# Patient Record
Sex: Female | Born: 1960 | Race: Black or African American | Hispanic: No | Marital: Married | Smoking: Former smoker
Health system: Southern US, Community
[De-identification: ages and names within clinical notes are randomized; demographics above are authoritative.]

## PROBLEM LIST (undated history)

## (undated) DIAGNOSIS — J45909 Unspecified asthma, uncomplicated: Secondary | ICD-10-CM

## (undated) DIAGNOSIS — J449 Chronic obstructive pulmonary disease, unspecified: Secondary | ICD-10-CM

## (undated) DIAGNOSIS — L309 Dermatitis, unspecified: Secondary | ICD-10-CM

## (undated) DIAGNOSIS — E162 Hypoglycemia, unspecified: Secondary | ICD-10-CM

## (undated) DIAGNOSIS — E039 Hypothyroidism, unspecified: Secondary | ICD-10-CM

## (undated) DIAGNOSIS — Z972 Presence of dental prosthetic device (complete) (partial): Secondary | ICD-10-CM

## (undated) DIAGNOSIS — IMO0001 Reserved for inherently not codable concepts without codable children: Secondary | ICD-10-CM

## (undated) HISTORY — DX: Unspecified asthma, uncomplicated: J45.909

## (undated) HISTORY — PX: APPENDECTOMY: SHX54

## (undated) HISTORY — PX: OOPHORECTOMY: SHX86

---

## 2008-07-31 ENCOUNTER — Ambulatory Visit: Payer: Self-pay | Admitting: Ophthalmology

## 2008-08-25 ENCOUNTER — Ambulatory Visit: Payer: Self-pay | Admitting: Family Medicine

## 2008-09-04 ENCOUNTER — Ambulatory Visit: Payer: Self-pay | Admitting: Family Medicine

## 2009-04-20 ENCOUNTER — Ambulatory Visit: Payer: Self-pay | Admitting: Family Medicine

## 2009-04-20 ENCOUNTER — Observation Stay: Payer: Self-pay | Admitting: Internal Medicine

## 2009-09-24 DIAGNOSIS — E039 Hypothyroidism, unspecified: Secondary | ICD-10-CM | POA: Insufficient documentation

## 2010-12-15 ENCOUNTER — Ambulatory Visit: Payer: Self-pay | Admitting: Family Medicine

## 2011-07-18 LAB — HM PAP SMEAR: HM Pap smear: NORMAL

## 2012-01-04 ENCOUNTER — Ambulatory Visit: Payer: Self-pay | Admitting: Family Medicine

## 2012-01-04 LAB — HM MAMMOGRAPHY: HM Mammogram: NORMAL

## 2012-05-09 ENCOUNTER — Ambulatory Visit: Payer: Self-pay | Admitting: Family Medicine

## 2013-11-14 LAB — LIPID PANEL
Cholesterol: 216 mg/dL — AB (ref 0–200)
HDL: 66 mg/dL (ref 35–70)
LDL CALC: 136 mg/dL
Triglycerides: 72 mg/dL (ref 40–160)

## 2014-03-13 ENCOUNTER — Ambulatory Visit: Payer: Self-pay | Admitting: Family Medicine

## 2014-03-19 ENCOUNTER — Encounter: Payer: Self-pay | Admitting: *Deleted

## 2014-04-02 ENCOUNTER — Ambulatory Visit: Payer: Self-pay | Admitting: General Surgery

## 2014-04-09 ENCOUNTER — Encounter: Payer: Self-pay | Admitting: *Deleted

## 2014-06-04 LAB — HEMOGLOBIN A1C: Hgb A1c MFr Bld: 6.4 % — AB (ref 4.0–6.0)

## 2014-07-14 ENCOUNTER — Ambulatory Visit: Payer: Self-pay | Admitting: General Surgery

## 2014-09-02 ENCOUNTER — Encounter: Payer: Self-pay | Admitting: *Deleted

## 2014-09-02 ENCOUNTER — Telehealth: Payer: Self-pay

## 2014-09-02 NOTE — Telephone Encounter (Signed)
Called patient to notify her we received letter from [facility] notifying us of her no show

## 2014-09-04 ENCOUNTER — Ambulatory Visit: Payer: Self-pay | Admitting: Family Medicine

## 2014-11-18 ENCOUNTER — Other Ambulatory Visit: Payer: Self-pay | Admitting: Family Medicine

## 2014-11-18 NOTE — Telephone Encounter (Signed)
Patient requesting refill. 

## 2014-11-20 NOTE — Telephone Encounter (Signed)
Spoke with patient on 11-19-14 and she will call back to reschedule.

## 2015-01-07 ENCOUNTER — Ambulatory Visit (INDEPENDENT_AMBULATORY_CARE_PROVIDER_SITE_OTHER): Payer: 59 | Admitting: Family Medicine

## 2015-01-07 ENCOUNTER — Encounter: Payer: Self-pay | Admitting: Family Medicine

## 2015-01-07 ENCOUNTER — Encounter (INDEPENDENT_AMBULATORY_CARE_PROVIDER_SITE_OTHER): Payer: Self-pay

## 2015-01-07 VITALS — BP 118/68 | HR 88 | Temp 98.8°F | Resp 16 | Wt 155.2 lb

## 2015-01-07 DIAGNOSIS — Z23 Encounter for immunization: Secondary | ICD-10-CM

## 2015-01-07 DIAGNOSIS — E039 Hypothyroidism, unspecified: Secondary | ICD-10-CM

## 2015-01-07 DIAGNOSIS — J069 Acute upper respiratory infection, unspecified: Secondary | ICD-10-CM | POA: Diagnosis not present

## 2015-01-07 DIAGNOSIS — J302 Other seasonal allergic rhinitis: Secondary | ICD-10-CM

## 2015-01-07 DIAGNOSIS — L280 Lichen simplex chronicus: Secondary | ICD-10-CM | POA: Insufficient documentation

## 2015-01-07 DIAGNOSIS — K8020 Calculus of gallbladder without cholecystitis without obstruction: Secondary | ICD-10-CM

## 2015-01-07 DIAGNOSIS — N841 Polyp of cervix uteri: Secondary | ICD-10-CM | POA: Insufficient documentation

## 2015-01-07 DIAGNOSIS — Z862 Personal history of diseases of the blood and blood-forming organs and certain disorders involving the immune mechanism: Secondary | ICD-10-CM | POA: Diagnosis not present

## 2015-01-07 DIAGNOSIS — R7303 Prediabetes: Secondary | ICD-10-CM | POA: Diagnosis not present

## 2015-01-07 DIAGNOSIS — E559 Vitamin D deficiency, unspecified: Secondary | ICD-10-CM

## 2015-01-07 DIAGNOSIS — Z13220 Encounter for screening for lipoid disorders: Secondary | ICD-10-CM | POA: Diagnosis not present

## 2015-01-07 DIAGNOSIS — J4540 Moderate persistent asthma, uncomplicated: Secondary | ICD-10-CM

## 2015-01-07 DIAGNOSIS — R7989 Other specified abnormal findings of blood chemistry: Secondary | ICD-10-CM

## 2015-01-07 DIAGNOSIS — E663 Overweight: Secondary | ICD-10-CM

## 2015-01-07 DIAGNOSIS — R945 Abnormal results of liver function studies: Secondary | ICD-10-CM

## 2015-01-07 DIAGNOSIS — D259 Leiomyoma of uterus, unspecified: Secondary | ICD-10-CM | POA: Insufficient documentation

## 2015-01-07 MED ORDER — FLUTICASONE PROPIONATE 50 MCG/ACT NA SUSP
2.0000 | Freq: Every day | NASAL | Status: DC
Start: 2015-01-07 — End: 2015-05-11

## 2015-01-07 MED ORDER — LEVOTHYROXINE SODIUM 125 MCG PO TABS: 125.0000 ug | ORAL_TABLET | Freq: Every day | ORAL | Status: DC

## 2015-01-07 MED ORDER — ALBUTEROL SULFATE HFA 108 (90 BASE) MCG/ACT IN AERS: 2.0000 | INHALATION_SPRAY | Freq: Four times a day (QID) | RESPIRATORY_TRACT | Status: DC | PRN

## 2015-01-07 MED ORDER — FLUTICASONE FUROATE-VILANTEROL 100-25 MCG/INH IN AEPB: 1.0000 | INHALATION_SPRAY | Freq: Every day | RESPIRATORY_TRACT | Status: DC

## 2015-01-07 MED ORDER — LORATADINE 10 MG PO TABS: 10.0000 mg | ORAL_TABLET | Freq: Every day | ORAL | Status: DC

## 2015-01-07 NOTE — Progress Notes (Addendum)
Name: Maria Richardson   MRN: [id number]    DOB: 11/02/1960   Date:01/07/2015       Progress Note  Subjective  Chief Complaint  Chief Complaint  Patient presents with  . Cough    Pt has had cold sypmtoms since last Wed which are not getting better with OTC medications  . Nasal Congestion    HPI  URI: she states symptoms started with dry cough followed by nasal congestion and yesterday she developed SOB. She had to use the second dose of Advair last night to improve symptoms. She has not tried any OTC medications. No chills, fever, ear pain, no sore throat. Grandchildren always have runny noses  Asthma Moderate: she usually uses Advair only once daily, symptoms are usually controlled, but she has noticed SOB, wheezing and cough with recent URI. She also has noticed difficulty walking from her car to her job, she has to walk very slowly to be able to breat    01/07/15 1018  Asthma History  Symptoms >2 days/week  Nighttime Awakenings 0-2/month  Asthma interference with normal activity Minor limitations  SABA use (not for EIB) 0-2 days/wk  Risk: Exacerbations requiring oral systemic steroids 0-1 / year  Asthma Severity Moderate Persistent   Hypothyroidism: she has been out of medication for the past week. Denies dry skin, constipation or hair loss.   AR: she has not been using any medications at this time  Patient Active Problem List   Diagnosis Date Noted  . Abnormal LFTs 01/07/2015  . Seasonal allergic rhinitis 01/07/2015  . Dermatitis, eczematoid 01/07/2015  . History of anemia 01/07/2015  . Excess weight 01/07/2015  . 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 Leiomyoma of uterus 01/07/2015  . Calculus of gallbladder 01/07/2015  . Acquired hypothyroidism 09/24/2009  . Asthma, moderate persistent, poorly-controlled 05/18/2009  . Vitamin D deficiency 05/18/2009    History reviewed. No pertinent past surgical history.  Family History  Problem Relation Age of Onset  . Diabetes  Father   . Hypertension Father   . Kidney disease Father     Social History   Social History  . Marital Status: Married    Spouse Name: N/A  . Number of Children: N/A  . Years of Education: N/A   Occupational History  . Not on file.   Social History Main Topics  . Smoking status: Former Research scientist (life sciences)  . Smokeless tobacco: Not on file     Comment: quit 17 years ago  . Alcohol Use: No  . Drug Use: No  . Sexual Activity: Not Currently     Comment: no desire   Other Topics Concern  . Not on file   Social History Narrative  . No narrative on file     Current outpatient prescriptions:  .  levothyroxine (LEVOXYL) 125 MCG tablet, Take by mouth., Disp: , Rfl:  .  albuterol (PROAIR HFA) 108 (90 BASE) MCG/ACT inhaler, Inhale 2 puffs into the lungs every 6 (six) hours as needed for wheezing or shortness of breath., Disp: 1 Inhaler, Rfl: 0 .  fluticasone (FLONASE) 50 MCG/ACT nasal spray, Place 2 sprays into both nostrils daily., Disp: 16 g, Rfl: 2 .  Fluticasone Furoate-Vilanterol (BREO ELLIPTA) 100-25 MCG/INH AEPB, Inhale 1 puff into the lungs daily., Disp: 1 each, Rfl: 5  Allergies  Allergen Reactions  . Codeine Nausea And Vomiting     ROS  Constitutional: Negative for fever or weight change.  Respiratory: Positive for cough and shortness of breath.   Cardiovascular: Negative  for chest pain or palpitations.  Gastrointestinal: Negative for abdominal pain, no bowel changes.  Musculoskeletal: Negative for gait problem or joint swelling.  Skin: Negative for rash.  Neurological: Negative for dizziness or headache.  No other specific complaints in a complete review of systems (except as listed in HPI above).  Objective  Filed Vitals:   01/07/15 0958  BP: 118/68  Pulse: 88  Temp: 98.8 F (37.1 C)  Resp: 16  Weight: 155 lb 3 oz (70.393 kg)  SpO2: 92%    Body mass index is 29.34 kg/(m^2).  Physical Exam  Constitutional: Patient appears well-developed and well-nourished.  Obese  No distress.  HEENT: head atraumatic, normocephalic, pupils equal and reactive to light, ear TM normal  neck supple, throat within normal limits, boggy turbinates, worse on left side, non tenderness during percussion of sinus Cardiovascular: Normal rate, regular rhythm and normal heart sounds.  No murmur heard. No BLE edema. Pulmonary/Chest: Effort normal and scattered end inspiratory crackles heard on basesl. No respiratory distress. Abdominal: Soft.  There is no tenderness. Psychiatric: Patient has a normal mood and affect. behavior is normal. Judgment and thought content normal.    Assessment & Plan  1. Asthma, moderate persistent, poorly-controlled   change to Breo to increase compliance, having a flare secondary to URI - albuterol (PROAIR HFA) 108 (90 BASE) MCG/ACT inhaler; Inhale 2 puffs into the lungs every 6 (six) hours as needed for wheezing or shortness of breath.  Dispense: 1 Inhaler; Refill: 0 - Fluticasone Furoate-Vilanterol (BREO ELLIPTA) 100-25 MCG/INH AEPB; Inhale 1 puff into the lungs daily.  Dispense: 1 each; Refill: 5  2. Calculus of gallbladder   Incidental finding when we were looking for elevated LFT's she was referred to surgeon twice, but she will call back when she has time off to have surgery next year. She denies biliary cholic   3. Acquired hypothyroidism   Recheck labs, she has been off medication for the past week, so we will send same dose of Levothyroxine - TSH  4. Excess weight  Discussed with the patient the risk posed by an increased BMI. Discussed importance of portion control, calorie counting and at least 150 minutes of physical activity weekly. Avoid sweet beverages and drink more water. Eat at least 6 servings of fruit and vegetables daily   5. Upper respiratory infection  Advised nasal saline, otc medication  6. Seasonal allergic rhinitis  - fluticasone (FLONASE) 50 MCG/ACT nasal spray; Place 2 sprays into both nostrils daily.   Dispense: 16 g; Refill: 2 -Loratadine 10 mg one daily  7. Borderline diabetes  - Hemoglobin A1c - Comprehensive metabolic panel  8. Vitamin D deficiency  - Vit D  25 hydroxy (rtn osteoporosis monitoring)  9. History of anemia  - CBC with Differential/Platelet  10. Abnormal LFTs  - Comprehensive metabolic panel  11. Lipid screening  - Lipid panel  12. Needs flu shot  - Flu Vaccine QUAD 36+ mos IM

## 2015-01-07 NOTE — Patient Instructions (Signed)
Coricidin HBP.

## 2015-01-08 LAB — CBC WITH DIFFERENTIAL/PLATELET
BASOS: 1 %
Basophils Absolute: 0.1 10*3/uL (ref 0.0–0.2)
EOS (ABSOLUTE): 0.7 10*3/uL — ABNORMAL HIGH (ref 0.0–0.4)
EOS: 9 %
HEMATOCRIT: 41.3 % (ref 34.0–46.6)
Hemoglobin: 13.5 g/dL (ref 11.1–15.9)
IMMATURE GRANS (ABS): 0 10*3/uL (ref 0.0–0.1)
IMMATURE GRANULOCYTES: 0 %
Lymphocytes Absolute: 1.5 10*3/uL (ref 0.7–3.1)
Lymphs: 18 %
MCH: 29.3 pg (ref 26.6–33.0)
MCHC: 32.7 g/dL (ref 31.5–35.7)
MCV: 90 fL (ref 79–97)
MONOS ABS: 0.4 10*3/uL (ref 0.1–0.9)
Monocytes: 5 %
NEUTROS ABS: 5.4 10*3/uL (ref 1.4–7.0)
NEUTROS PCT: 67 %
Platelets: 286 10*3/uL (ref 150–379)
RBC: 4.6 x10E6/uL (ref 3.77–5.28)
RDW: 15.5 % — AB (ref 12.3–15.4)
WBC: 8.1 10*3/uL (ref 3.4–10.8)

## 2015-01-08 LAB — COMPREHENSIVE METABOLIC PANEL
ALBUMIN: 4.3 g/dL (ref 3.5–5.5)
ALT: 17 IU/L (ref 0–32)
AST: 26 IU/L (ref 0–40)
Albumin/Globulin Ratio: 1.3 (ref 1.1–2.5)
Alkaline Phosphatase: 85 IU/L (ref 39–117)
BUN/Creatinine Ratio: 11 (ref 9–23)
BUN: 11 mg/dL (ref 6–24)
Bilirubin Total: 0.5 mg/dL (ref 0.0–1.2)
CALCIUM: 9.5 mg/dL (ref 8.7–10.2)
CO2: 25 mmol/L (ref 18–29)
CREATININE: 0.97 mg/dL (ref 0.57–1.00)
Chloride: 102 mmol/L (ref 97–108)
GFR, EST AFRICAN AMERICAN: 77 mL/min/{1.73_m2} (ref >59)
GFR, EST NON AFRICAN AMERICAN: 67 mL/min/{1.73_m2} (ref >59)
GLOBULIN, TOTAL: 3.4 g/dL (ref 1.5–4.5)
Glucose: 92 mg/dL (ref 65–99)
Potassium: 4.5 mmol/L (ref 3.5–5.2)
SODIUM: 143 mmol/L (ref 134–144)
TOTAL PROTEIN: 7.7 g/dL (ref 6.0–8.5)

## 2015-01-08 LAB — LIPID PANEL
CHOL/HDL RATIO: 3.5 ratio (ref 0.0–4.4)
Cholesterol, Total: 209 mg/dL — ABNORMAL HIGH (ref 100–199)
HDL: 59 mg/dL (ref >39)
LDL Calculated: 135 mg/dL — ABNORMAL HIGH (ref 0–99)
Triglycerides: 73 mg/dL (ref 0–149)
VLDL CHOLESTEROL CAL: 15 mg/dL (ref 5–40)

## 2015-01-08 LAB — TSH: TSH: 45.56 u[IU]/mL — ABNORMAL HIGH (ref 0.450–4.500)

## 2015-01-08 LAB — HEMOGLOBIN A1C
Est. average glucose Bld gHb Est-mCnc: 131 mg/dL
Hgb A1c MFr Bld: 6.2 % — ABNORMAL HIGH (ref 4.8–5.6)

## 2015-01-08 LAB — VITAMIN D 25 HYDROXY (VIT D DEFICIENCY, FRACTURES): Vit D, 25-Hydroxy: 18.4 ng/mL — ABNORMAL LOW (ref 30.0–100.0)

## 2015-01-10 ENCOUNTER — Other Ambulatory Visit: Payer: Self-pay | Admitting: Family Medicine

## 2015-01-10 DIAGNOSIS — E039 Hypothyroidism, unspecified: Secondary | ICD-10-CM

## 2015-01-11 NOTE — Progress Notes (Signed)
Patient notified and mailed copy of lab slip to be rechecked in 6 weeks.

## 2015-03-22 ENCOUNTER — Other Ambulatory Visit: Payer: Self-pay | Admitting: Family Medicine

## 2015-03-23 LAB — TSH: TSH: 33.55 u[IU]/mL — ABNORMAL HIGH (ref 0.450–4.500)

## 2015-03-31 ENCOUNTER — Telehealth: Payer: Self-pay

## 2015-03-31 DIAGNOSIS — E039 Hypothyroidism, unspecified: Secondary | ICD-10-CM

## 2015-03-31 MED ORDER — LEVOTHYROXINE SODIUM 150 MCG PO TABS: 150.0000 ug | ORAL_TABLET | Freq: Every day | ORAL | Status: DC

## 2015-03-31 NOTE — Telephone Encounter (Signed)
Pt called states she ran out of thyroid med about 3 days before getting blood work and has not took none since.  Before when she was taking it was 1 pill a daily, but states when she remembered to take.

## 2015-03-31 NOTE — Telephone Encounter (Signed)
I sent a higher dose, needs to take it daily and recheck level in 6 weeks. Thank you. I ordered labs also

## 2015-04-07 ENCOUNTER — Telehealth: Payer: Self-pay | Admitting: Family Medicine

## 2015-04-07 NOTE — Telephone Encounter (Signed)
Tell her to try taking half twice daily

## 2015-04-07 NOTE — Telephone Encounter (Signed)
Thyroxin was changed to 150 but it is not agreeing with her. She is suppose to take it with breakfast. The medication curves her appitate, she is not able to sleep. Please advise

## 2015-04-08 ENCOUNTER — Telehealth: Payer: Self-pay

## 2015-04-08 NOTE — Telephone Encounter (Signed)
Patient: Maria Richardson was changed to 150 but it is not agreeing with her. She is suppose to take it with breakfast. The medication curves her appitate, she is not able to sleep. Please advise  Dr.Sowles: Tell her to try taking half twice daily   Patient was informed of Dr. Ancil Boozer message and stated that she will try it, although she did not have this problem yesterday when she did not take her medication. She was encouraged to call us back if her sx does not improve or if they worsen and she said ok.

## 2015-04-22 ENCOUNTER — Other Ambulatory Visit: Payer: Self-pay

## 2015-04-22 ENCOUNTER — Telehealth: Payer: Self-pay | Admitting: Family Medicine

## 2015-04-22 MED ORDER — FLUTICASONE-SALMETEROL 250-50 MCG/DOSE IN AEPB: 1.0000 | INHALATION_SPRAY | Freq: Two times a day (BID) | RESPIRATORY_TRACT | Status: DC

## 2015-04-22 NOTE — Telephone Encounter (Signed)
She is not on Advair, she is on Breo now. Does she want a 90 day of Breo, or change back to Advair

## 2015-04-22 NOTE — Telephone Encounter (Signed)
Patient states the [place] works better for her, and she quit taking the [place]. Could you please send in Advair to her Mail order. Thanks

## 2015-04-22 NOTE — Telephone Encounter (Signed)
Pt needs refill on Advair 90 day supply to be sent to [facility] in [place].

## 2015-04-22 NOTE — Telephone Encounter (Signed)
done

## 2015-04-22 NOTE — Telephone Encounter (Signed)
Patient states Advair works better for patient than Breo, and would like her supply please send to her Mail order.

## 2015-05-11 ENCOUNTER — Ambulatory Visit (INDEPENDENT_AMBULATORY_CARE_PROVIDER_SITE_OTHER): Payer: 59 | Admitting: Family Medicine

## 2015-05-11 ENCOUNTER — Encounter: Payer: Self-pay | Admitting: Family Medicine

## 2015-05-11 VITALS — BP 130/62 | HR 90 | Temp 98.5°F | Resp 14 | Wt 152.8 lb

## 2015-05-11 DIAGNOSIS — E039 Hypothyroidism, unspecified: Secondary | ICD-10-CM | POA: Diagnosis not present

## 2015-05-11 DIAGNOSIS — K921 Melena: Secondary | ICD-10-CM | POA: Diagnosis not present

## 2015-05-11 DIAGNOSIS — R7303 Prediabetes: Secondary | ICD-10-CM | POA: Diagnosis not present

## 2015-05-11 DIAGNOSIS — J069 Acute upper respiratory infection, unspecified: Secondary | ICD-10-CM

## 2015-05-11 DIAGNOSIS — J302 Other seasonal allergic rhinitis: Secondary | ICD-10-CM

## 2015-05-11 MED ORDER — FLUTICASONE PROPIONATE 50 MCG/ACT NA SUSP: 2.0000 | Freq: Every day | NASAL | Status: DC

## 2015-05-11 MED ORDER — BENZONATATE 100 MG PO CAPS: 100.0000 mg | ORAL_CAPSULE | Freq: Three times a day (TID) | ORAL | Status: DC | PRN

## 2015-05-11 NOTE — Progress Notes (Signed)
Name: Maria Richardson   MRN: [id number]    DOB: 03/16/1961   Date:05/11/2015       Progress Note  Subjective  Chief Complaint  Chief Complaint  Patient presents with  . Blood In Stools  . URI    HPI  Hematochezia: she has noticed some bright blood when she wipes but also mixed in her stools over the past 2 weeks with every bowel movement. She denies constipation or straining. No change in appetite, very mild weight loss, only a few lbs since last visit. She denies abdominal pain, or indigestion. Father has a history of colon cancer.   Borderline DM: last hgb A1C was slightly better, down from 6.4% to 6.2% - she is following a diabetic diet. She denies polyphagia, polydipsia or polyuria.  Hypothyroidism: last TSH still above goal , she is taking medication but she states she has been taking after meals, explained she needs to take fasting to improve absorption.   URI: father has URI, and over the past 2 days she has developed nasal congestion, rhinorrhea, and a wet cough. No fever, chills or headaches. Normal appetite. She is taking otc Tylenol flu medication - but only takes at night. She is not sure if it is helping with symptoms.   Asthma Moderate: even with her cold, she denies wheezing or SOB, only the cough.    Patient Active Problem List   Diagnosis Date Noted  . Seasonal allergic rhinitis 01/07/2015  . Dermatitis, eczematoid 01/07/2015  . History of anemia 01/07/2015  . Excess weight 01/07/2015  . 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 Leiomyoma of uterus 01/07/2015  . Calculus of gallbladder 01/07/2015  . Acquired hypothyroidism 09/24/2009  . Asthma, moderate persistent, poorly-controlled 05/18/2009  . Vitamin D deficiency 05/18/2009    History reviewed. No pertinent past surgical history.  Family History  Problem Relation Age of Onset  . Diabetes Father   . Hypertension Father   . Kidney disease Father     Social History   Social History  . Marital Status: Married     Spouse Name: N/A  . Number of Children: N/A  . Years of Education: N/A   Occupational History  . Not on file.   Social History Main Topics  . Smoking status: Former Research scientist (life sciences)  . Smokeless tobacco: Not on file     Comment: quit 17 years ago  . Alcohol Use: No  . Drug Use: No  . Sexual Activity: Not Currently     Comment: no desire   Other Topics Concern  . Not on file   Social History Narrative     Current outpatient prescriptions:  .  Fluticasone-Salmeterol (ADVAIR DISKUS) 250-50 MCG/DOSE AEPB, Inhale 1 puff into the lungs 2 (two) times daily., Disp: 3 each, Rfl: 2 .  levothyroxine (SYNTHROID, LEVOTHROID) 150 MCG tablet, Take 1 tablet (150 mcg total) by mouth daily before breakfast., Disp: 30 tablet, Rfl: 1 .  albuterol (PROAIR HFA) 108 (90 BASE) MCG/ACT inhaler, Inhale 2 puffs into the lungs every 6 (six) hours as needed for wheezing or shortness of breath. (Patient not taking: Reported on 05/11/2015), Disp: 1 Inhaler, Rfl: 0 .  fluticasone (FLONASE) 50 MCG/ACT nasal spray, Place 2 sprays into both nostrils daily., Disp: 16 g, Rfl: 2  Allergies  Allergen Reactions  . Codeine Nausea And Vomiting     ROS  Ten systems reviewed and is negative except as mentioned in HPI   Objective  Filed Vitals:   05/11/15 1435  BP: 130/62  Pulse: 90  Temp: 98.5 F (36.9 C)  TempSrc: Oral  Resp: 14  Weight: 152 lb 12.8 oz (69.31 kg)  SpO2: 94%    Body mass index is 28.89 kg/(m^2).  Physical Exam  Constitutional: Patient appears well-developed and well-nourished. Obese  No distress.  HEENT: head atraumatic, normocephalic, pupils equal and reactive to light, ears TM normal bilateral, neck supple, throat within normal limits Cardiovascular: Normal rate, regular rhythm and normal heart sounds.  No murmur heard. No BLE edema. Pulmonary/Chest: Effort normal and breath sounds normal. No respiratory distress. Abdominal: Soft.  There is no tenderness. Psychiatric: Patient has a  normal mood and affect. behavior is normal. Judgment and thought content normal.  Recent Results (from the past 2160 hour(s))  TSH     Status: Abnormal   Collection Time: 03/22/15  8:14 AM  Result Value Ref Range   TSH 33.550 (H) 0.450 - 4.500 uIU/mL     Assessment & Plan  1. Acquired hypothyroidism  - TSH  2. Upper respiratory infection  - benzonatate (TESSALON) 100 MG capsule; Take 1-2 capsules (100-200 mg total) by mouth 3 (three) times daily as needed for cough.  Dispense: 40 capsule; Refill: 0  3. Hematochezia  She refused colonoscopy in the past, but is willing to go now. Exam not done, needs to be evaluated, explained that even if she has hemorrhoids she will need a colonoscopy  - Ambulatory referral to General Surgery - CBC with Differential/Platelet - Ferritin  4. Borderline diabetes  Continue diabetic diet  5. Seasonal allergic rhinitis  - fluticasone (FLONASE) 50 MCG/ACT nasal spray; Place 2 sprays into both nostrils daily.  Dispense: 16 g; Refill: 2

## 2015-05-12 ENCOUNTER — Telehealth: Payer: Self-pay

## 2015-05-12 LAB — CBC WITH DIFFERENTIAL/PLATELET
BASOS ABS: 0.1 10*3/uL (ref 0.0–0.2)
Basos: 1 %
EOS (ABSOLUTE): 0.6 10*3/uL — AB (ref 0.0–0.4)
Eos: 11 %
HEMOGLOBIN: 12.9 g/dL (ref 11.1–15.9)
Hematocrit: 39.5 % (ref 34.0–46.6)
Immature Grans (Abs): 0 10*3/uL (ref 0.0–0.1)
Immature Granulocytes: 0 %
LYMPHS ABS: 1.6 10*3/uL (ref 0.7–3.1)
Lymphs: 27 %
MCH: 29.8 pg (ref 26.6–33.0)
MCHC: 32.7 g/dL (ref 31.5–35.7)
MCV: 91 fL (ref 79–97)
Monocytes Absolute: 0.6 10*3/uL (ref 0.1–0.9)
Monocytes: 9 %
NEUTROS ABS: 3.1 10*3/uL (ref 1.4–7.0)
Neutrophils: 52 %
PLATELETS: 270 10*3/uL (ref 150–379)
RBC: 4.33 x10E6/uL (ref 3.77–5.28)
RDW: 14.6 % (ref 12.3–15.4)
WBC: 6 10*3/uL (ref 3.4–10.8)

## 2015-05-12 LAB — FERRITIN: FERRITIN: 304 ng/mL — AB (ref 15–150)

## 2015-05-12 LAB — TSH: TSH: 10.93 u[IU]/mL — ABNORMAL HIGH (ref 0.450–4.500)

## 2015-05-12 NOTE — Telephone Encounter (Signed)
rx was sent into patient's mail order yesterday instead of her local pharmacy.  rx for Tessalon & Flonase was verbally called into [facility] and cancelled via [employer] (spoke to [place]).

## 2015-05-14 ENCOUNTER — Other Ambulatory Visit: Payer: Self-pay | Admitting: Family Medicine

## 2015-05-14 DIAGNOSIS — E039 Hypothyroidism, unspecified: Secondary | ICD-10-CM

## 2015-05-14 MED ORDER — LEVOTHYROXINE SODIUM 150 MCG PO TABS: 150.0000 ug | ORAL_TABLET | Freq: Every day | ORAL | Status: DC

## 2015-05-16 ENCOUNTER — Telehealth: Payer: Self-pay | Admitting: Gastroenterology

## 2015-05-16 NOTE — Telephone Encounter (Signed)
Gastroenterology Pre-Procedure Review  Request Date 06-04-2015 Requesting Physician: Dr.   PATIENT REVIEW QUESTIONS: The patient responded to the following health history questions as indicated:    1. Are you having any GI issues? no 2. Do you have a personal history of Polyps? no 3. Do you have a family history of Colon Cancer or Polyps? yes (Father colon cancer) 4. Diabetes Mellitus? no 5. Joint replacements in the past 12 months?no 6. Major health problems in the past 3 months?no 7. Any artificial heart valves, MVP, or defibrillator?no    MEDICATIONS & ALLERGIES:    Patient reports the following regarding taking any anticoagulation/antiplatelet therapy:   Plavix, Coumadin, Eliquis, Xarelto, Lovenox, Pradaxa, Brilinta, or Effient? no Aspirin? no  Patient confirms/reports the following medications:  Current Outpatient Prescriptions  Medication Sig Dispense Refill   albuterol (PROAIR HFA) 108 (90 BASE) MCG/ACT inhaler Inhale 2 puffs into the lungs every 6 (six) hours as needed for wheezing or shortness of breath. (Patient not taking: Reported on 05/11/2015) 1 Inhaler 0   benzonatate (TESSALON) 100 MG capsule Take 1-2 capsules (100-200 mg total) by mouth 3 (three) times daily as needed for cough. 40 capsule 0   fluticasone (FLONASE) 50 MCG/ACT nasal spray Place 2 sprays into both nostrils daily. 16 g 2   Fluticasone-Salmeterol (ADVAIR DISKUS) 250-50 MCG/DOSE AEPB Inhale 1 puff into the lungs 2 (two) times daily. 3 each 2   levothyroxine (SYNTHROID, LEVOTHROID) 150 MCG tablet Take 1 tablet (150 mcg total) by mouth daily before breakfast. And one and a half on Sundays 35 tablet 1   No current facility-administered medications for this visit.    Patient confirms/reports the following allergies:  Allergies  Allergen Reactions   Codeine Nausea And Vomiting    No orders of the defined types were placed in this encounter.    AUTHORIZATION INFORMATION Primary  Insurance: 1D#: Group #:  Secondary Insurance: 1D#: Group #:  SCHEDULE INFORMATION: Date: 06-04-2015 Time: Location:[facility]

## 2015-05-17 ENCOUNTER — Other Ambulatory Visit: Payer: Self-pay

## 2015-05-28 ENCOUNTER — Encounter: Payer: Self-pay | Admitting: *Deleted

## 2015-06-01 NOTE — Discharge Instructions (Signed)

## 2015-06-14 ENCOUNTER — Ambulatory Visit: Payer: 59 | Admitting: Family Medicine

## 2015-07-14 ENCOUNTER — Other Ambulatory Visit: Payer: Self-pay | Admitting: Family Medicine

## 2015-07-14 DIAGNOSIS — E039 Hypothyroidism, unspecified: Secondary | ICD-10-CM

## 2015-07-14 DIAGNOSIS — E038 Other specified hypothyroidism: Secondary | ICD-10-CM

## 2015-07-14 MED ORDER — LEVOTHYROXINE SODIUM 150 MCG PO TABS: 150.0000 ug | ORAL_TABLET | Freq: Every day | ORAL | Status: DC

## 2015-07-14 NOTE — Telephone Encounter (Signed)
Pt needs refill on thyroid meds to be called into [facility]. Pt is out and has an appt on 07/20/2015.

## 2015-07-14 NOTE — Progress Notes (Signed)
Sending 30 days of levothyroxine, but she is due for labs now , I may need to adjust dose

## 2015-07-15 ENCOUNTER — Encounter: Payer: Self-pay | Admitting: *Deleted

## 2015-07-15 ENCOUNTER — Other Ambulatory Visit: Payer: Self-pay | Admitting: Family Medicine

## 2015-07-15 DIAGNOSIS — Z1231 Encounter for screening mammogram for malignant neoplasm of breast: Secondary | ICD-10-CM

## 2015-07-20 ENCOUNTER — Ambulatory Visit: Payer: 59 | Admitting: Family Medicine

## 2015-07-20 ENCOUNTER — Ambulatory Visit
Admission: RE | Admit: 2015-07-20 | Discharge: 2015-07-20 | Disposition: A | Discharge status: Home / Self Care | Payer: 59 | Source: Ambulatory Visit | Attending: Family Medicine | Admitting: Family Medicine

## 2015-07-20 DIAGNOSIS — Z1231 Encounter for screening mammogram for malignant neoplasm of breast: Secondary | ICD-10-CM | POA: Diagnosis not present

## 2015-07-23 ENCOUNTER — Ambulatory Visit
Admission: RE | Admit: 2015-07-23 | Discharge: 2015-07-23 | Disposition: A | Discharge status: Home / Self Care | Payer: 59 | Source: Ambulatory Visit | Attending: Gastroenterology | Admitting: Gastroenterology

## 2015-07-23 ENCOUNTER — Ambulatory Visit: Payer: 59 | Admitting: Anesthesiology

## 2015-07-23 ENCOUNTER — Encounter
Admission: RE | Disposition: A | Discharge status: Home / Self Care | Payer: Self-pay | Source: Ambulatory Visit | Attending: Gastroenterology

## 2015-07-23 DIAGNOSIS — Z800 Family history of malignant neoplasm of digestive organs: Secondary | ICD-10-CM | POA: Diagnosis not present

## 2015-07-23 DIAGNOSIS — Z841 Family history of disorders of kidney and ureter: Secondary | ICD-10-CM | POA: Insufficient documentation

## 2015-07-23 DIAGNOSIS — Z8249 Family history of ischemic heart disease and other diseases of the circulatory system: Secondary | ICD-10-CM | POA: Insufficient documentation

## 2015-07-23 DIAGNOSIS — Z90721 Acquired absence of ovaries, unilateral: Secondary | ICD-10-CM | POA: Diagnosis not present

## 2015-07-23 DIAGNOSIS — E162 Hypoglycemia, unspecified: Secondary | ICD-10-CM | POA: Insufficient documentation

## 2015-07-23 DIAGNOSIS — Z79899 Other long term (current) drug therapy: Secondary | ICD-10-CM | POA: Insufficient documentation

## 2015-07-23 DIAGNOSIS — Z87891 Personal history of nicotine dependence: Secondary | ICD-10-CM | POA: Diagnosis not present

## 2015-07-23 DIAGNOSIS — Z7951 Long term (current) use of inhaled steroids: Secondary | ICD-10-CM | POA: Diagnosis not present

## 2015-07-23 DIAGNOSIS — L309 Dermatitis, unspecified: Secondary | ICD-10-CM | POA: Insufficient documentation

## 2015-07-23 DIAGNOSIS — Z833 Family history of diabetes mellitus: Secondary | ICD-10-CM | POA: Insufficient documentation

## 2015-07-23 DIAGNOSIS — J45909 Unspecified asthma, uncomplicated: Secondary | ICD-10-CM | POA: Insufficient documentation

## 2015-07-23 DIAGNOSIS — D125 Benign neoplasm of sigmoid colon: Secondary | ICD-10-CM | POA: Insufficient documentation

## 2015-07-23 DIAGNOSIS — E039 Hypothyroidism, unspecified: Secondary | ICD-10-CM | POA: Diagnosis not present

## 2015-07-23 DIAGNOSIS — Z885 Allergy status to narcotic agent status: Secondary | ICD-10-CM | POA: Insufficient documentation

## 2015-07-23 DIAGNOSIS — Z1211 Encounter for screening for malignant neoplasm of colon: Secondary | ICD-10-CM | POA: Diagnosis not present

## 2015-07-23 HISTORY — PX: POLYPECTOMY: SHX5525

## 2015-07-23 HISTORY — DX: Reserved for inherently not codable concepts without codable children: IMO0001

## 2015-07-23 HISTORY — DX: Hypothyroidism, unspecified: E03.9

## 2015-07-23 HISTORY — DX: Hypoglycemia, unspecified: E16.2

## 2015-07-23 HISTORY — DX: Dermatitis, unspecified: L30.9

## 2015-07-23 HISTORY — DX: Presence of dental prosthetic device (complete) (partial): Z97.2

## 2015-07-23 HISTORY — PX: COLONOSCOPY WITH PROPOFOL: SHX5780

## 2015-07-23 SURGERY — COLONOSCOPY WITH PROPOFOL
Anesthesia: Monitor Anesthesia Care | Wound class: Contaminated

## 2015-07-23 MED ORDER — ACETAMINOPHEN 160 MG/5ML PO SOLN: 325.0000 mg | ORAL | Status: DC | PRN

## 2015-07-23 MED ORDER — PROPOFOL 10 MG/ML IV BOLUS
INTRAVENOUS | Status: DC | PRN
  Administered 2015-07-23: 100 mg via INTRAVENOUS
  Administered 2015-07-23: 10 mg via INTRAVENOUS
  Administered 2015-07-23 (×2): 50 mg via INTRAVENOUS
  Administered 2015-07-23: 10 mg via INTRAVENOUS

## 2015-07-23 MED ORDER — ACETAMINOPHEN 325 MG PO TABS: 325.0000 mg | ORAL_TABLET | ORAL | Status: DC | PRN

## 2015-07-23 MED ORDER — LACTATED RINGERS IV SOLN
INTRAVENOUS | Status: DC
  Administered 2015-07-23 (×2): via INTRAVENOUS

## 2015-07-23 MED ORDER — STERILE WATER FOR IRRIGATION IR SOLN
Status: DC | PRN
  Administered 2015-07-23: 11:00:00

## 2015-07-23 MED ORDER — LIDOCAINE HCL (CARDIAC) 20 MG/ML IV SOLN
INTRAVENOUS | Status: DC | PRN
  Administered 2015-07-23: 20 mg via INTRAVENOUS

## 2015-07-23 SURGICAL SUPPLY — 22 items

## 2015-07-23 NOTE — Anesthesia Preprocedure Evaluation (Signed)
Anesthesia Evaluation  Patient identified by MRN, date of birth, ID band  Reviewed: Allergy & Precautions, H&P , NPO status , Patient's Chart, lab work & pertinent test results  Airway Mallampati: II  TM Distance: >3 FB Neck ROM: full    Dental no notable dental hx. (+) Lower Dentures   Pulmonary asthma , former smoker,    Pulmonary exam normal        Cardiovascular  Rhythm:regular Rate:Normal     Neuro/Psych    GI/Hepatic   Endo/Other    Renal/GU      Musculoskeletal   Abdominal   Peds  Hematology   Anesthesia Other Findings   Reproductive/Obstetrics                             Anesthesia Physical Anesthesia Plan  ASA: II  Anesthesia Plan: MAC   Post-op Pain Management:    Induction:   Airway Management Planned:   Additional Equipment:   Intra-op Plan:   Post-operative Plan:   Informed Consent: I have reviewed the patients History and Physical, chart, labs and discussed the procedure including the risks, benefits and alternatives for the proposed anesthesia with the patient or authorized representative who has indicated his/her understanding and acceptance.     Plan Discussed with: CRNA  Anesthesia Plan Comments:         Anesthesia Quick Evaluation

## 2015-07-23 NOTE — Op Note (Signed)
[facility] Gastroenterology Patient Name: Maria Richardson Procedure Date: 07/23/2015 11:10 AM MRN: [id number] Account #: [id number] Date of Birth: Sep 21, 1960 Admit Type: Outpatient Age: 55 Room: [facility] OR ROOM 01 Gender: Female Note Status: Finalized Procedure:            Colonoscopy Indications:          Screening for colorectal malignant neoplasm Providers:            Lucilla Lame, MD Referring MD:         Bethena Roys. Sowles, MD (Referring MD) Medicines:            Propofol per Anesthesia Complications:        No immediate complications. Procedure:            Pre-Anesthesia Assessment:                       - Prior to the procedure, a History and Physical was                        performed, and patient medications and allergies were                        reviewed. The patient's tolerance of previous                        anesthesia was also reviewed. The risks and benefits of                        the procedure and the sedation options and risks were                        discussed with the patient. All questions were                        answered, and informed consent was obtained. Prior                        Anticoagulants: The patient has taken no previous                        anticoagulant or antiplatelet agents. ASA Grade                        Assessment: II - A patient with mild systemic disease.                        After reviewing the risks and benefits, the patient was                        deemed in satisfactory condition to undergo the                        procedure.                       After obtaining informed consent, the colonoscope was                        passed under direct vision. Throughout the procedure,  the patient's blood pressure, pulse, and oxygen                        saturations were monitored continuously. The Olympus CF                        H180AL colonoscope (S#: [id number]) was introduced  through                        the anus and advanced to the the cecum, identified by                        appendiceal orifice and ileocecal valve. The                        colonoscopy was performed without difficulty. The                        patient tolerated the procedure well. The quality of                        the bowel preparation was excellent. Findings:      The perianal and digital rectal examinations were normal.      A 4 mm polyp was found in the sigmoid colon. The polyp was sessile. The       polyp was removed with a cold biopsy forceps. Resection and retrieval       were complete. Impression:           - One 4 mm polyp in the sigmoid colon, removed with a                        cold biopsy forceps. Resected and retrieved. Recommendation:       - Await pathology results.                       - Repeat colonoscopy in 5 years if polyp adenoma and 10                        years if hyperplastic Procedure Code(s):    --- Professional ---                       [phone], Colonoscopy, flexible; with biopsy, single or                        multiple Diagnosis Code(s):    --- Professional ---                       Z12.11, Encounter for screening for malignant neoplasm                        of colon                       D12.5, Benign neoplasm of sigmoid colon CPT copyright 2016 American Medical Association. All rights reserved. The codes documented in this report are preliminary and upon coder review may  be revised to meet current compliance requirements. Lucilla Lame, MD 07/23/2015 11:31:36 AM This report has been signed electronically. Number of Addenda: 0 Note Initiated On: 07/23/2015  11:10 AM Scope Withdrawal Time: 0 hours 6 minutes 53 seconds  Total Procedure Duration: 0 hours 9 minutes 41 seconds       [facility]

## 2015-07-23 NOTE — Anesthesia Procedure Notes (Signed)
Procedure Name: MAC Performed by: Lyrical Sowle Pre-anesthesia Checklist: Patient identified, Emergency Drugs available, Suction available, Patient being monitored and Timeout performed Patient Re-evaluated:Patient Re-evaluated prior to inductionOxygen Delivery Method: Nasal cannula       

## 2015-07-23 NOTE — Transfer of Care (Signed)
Immediate Anesthesia Transfer of Care Note  Patient: Maria Richardson  Procedure(s) Performed: Procedure(s) with comments: COLONOSCOPY WITH PROPOFOL (N/A) - PLEASE LEAVE PT AT LATER AM  POLYPECTOMY  Patient Location: PACU  Anesthesia Type: MAC  Level of Consciousness: awake, alert  and patient cooperative  Airway and Oxygen Therapy: Patient Spontanous Breathing and Patient connected to supplemental oxygen  Post-op Assessment: Post-op Vital signs reviewed, Patient's Cardiovascular Status Stable, Respiratory Function Stable, Patent Airway and No signs of Nausea or vomiting  Post-op Vital Signs: Reviewed and stable  Complications: No apparent anesthesia complications

## 2015-07-23 NOTE — Anesthesia Postprocedure Evaluation (Signed)
Anesthesia Post Note  Patient: Maria Richardson  Procedure(s) Performed: Procedure(s) (LRB): COLONOSCOPY WITH PROPOFOL (N/A) POLYPECTOMY  Patient location during evaluation: PACU Anesthesia Type: MAC Level of consciousness: awake and alert and oriented Pain management: satisfactory to patient Vital Signs Assessment: post-procedure vital signs reviewed and stable Respiratory status: spontaneous breathing, nonlabored ventilation and respiratory function stable Cardiovascular status: blood pressure returned to baseline and stable Postop Assessment: Adequate PO intake and No signs of nausea or vomiting Anesthetic complications: no    Raliegh Ip

## 2015-07-23 NOTE — H&P (Signed)
  [facility]  [address] [place], [place] [address] Phone: [phone] Fax : [phone]  Primary Care Physician:  Loistine Chance, MD Primary Gastroenterologist:  Dr. Allen Norris  Pre-Procedure History & Physical: HPI:  Maria Richardson is a 55 y.o. female is here for a screening colonoscopy.   Past Medical History  Diagnosis Date  . Asthma   . Wears dentures     full lower  . Shortness of breath dyspnea     because of asthma  . Eczema   . Hypoglycemia   . Hypothyroidism     Past Surgical History  Procedure Laterality Date  . Oophorectomy      as teenager  . Appendectomy      Prior to Admission medications   Medication Sig Start Date End Date Taking? Authorizing Provider  Fluticasone-Salmeterol (ADVAIR DISKUS) 250-50 MCG/DOSE AEPB Inhale 1 puff into the lungs 2 (two) times daily. 04/22/15   Steele Sizer, MD  levothyroxine (SYNTHROID, LEVOTHROID) 150 MCG tablet Take 1 tablet (150 mcg total) by mouth daily before breakfast. And one and a half on Sundays 07/14/15   Steele Sizer, MD    Allergies as of 05/17/2015 - Review Complete 05/11/2015  Allergen Reaction Noted  . Codeine Nausea And Vomiting 07/15/2014    Family History  Problem Relation Age of Onset  . Diabetes Father   . Hypertension Father   . Kidney disease Father   . Colon cancer Father     Social History   Social History  . Marital Status: Married    Spouse Name: N/A  . Number of Children: N/A  . Years of Education: N/A   Occupational History  . Not on file.   Social History Main Topics  . Smoking status: Former Research scientist (life sciences)  . Smokeless tobacco: Not on file     Comment: quit 17 years ago  . Alcohol Use: No  . Drug Use: No  . Sexual Activity: Not Currently     Comment: no desire   Other Topics Concern  . Not on file   Social History Narrative    Review of Systems: See HPI, otherwise negative ROS  Physical Exam: Ht 5\' 1"  (1.549 m)  Wt 153 lb (69.4 kg)  BMI 28.92  kg/m2 General:   Alert,  pleasant and cooperative in NAD Head:  Normocephalic and atraumatic. Neck:  Supple; no masses or thyromegaly. Lungs:  Clear throughout to auscultation.    Heart:  Regular rate and rhythm. Abdomen:  Soft, nontender and nondistended. Normal bowel sounds, without guarding, and without rebound.   Neurologic:  Alert and  oriented x4;  grossly normal neurologically.  Impression/Plan: Maria Richardson is now here to undergo a screening colonoscopy.  Risks, benefits, and alternatives regarding colonoscopy have been reviewed with the patient.  Questions have been answered.  All parties agreeable.

## 2015-07-26 ENCOUNTER — Encounter: Payer: Self-pay | Admitting: Gastroenterology

## 2015-07-27 ENCOUNTER — Encounter: Payer: Self-pay | Admitting: Gastroenterology

## 2015-07-29 ENCOUNTER — Encounter: Payer: Self-pay | Admitting: Gastroenterology

## 2015-08-04 ENCOUNTER — Encounter: Payer: Self-pay | Admitting: Family Medicine

## 2015-08-04 ENCOUNTER — Ambulatory Visit (INDEPENDENT_AMBULATORY_CARE_PROVIDER_SITE_OTHER): Payer: 59 | Admitting: Family Medicine

## 2015-08-04 VITALS — BP 106/70 | HR 76 | Temp 97.8°F | Resp 16 | Wt 144.8 lb

## 2015-08-04 DIAGNOSIS — E559 Vitamin D deficiency, unspecified: Secondary | ICD-10-CM | POA: Diagnosis not present

## 2015-08-04 DIAGNOSIS — J4540 Moderate persistent asthma, uncomplicated: Secondary | ICD-10-CM | POA: Diagnosis not present

## 2015-08-04 DIAGNOSIS — R7303 Prediabetes: Secondary | ICD-10-CM | POA: Diagnosis not present

## 2015-08-04 DIAGNOSIS — E785 Hyperlipidemia, unspecified: Secondary | ICD-10-CM | POA: Diagnosis not present

## 2015-08-04 DIAGNOSIS — J302 Other seasonal allergic rhinitis: Secondary | ICD-10-CM

## 2015-08-04 DIAGNOSIS — E039 Hypothyroidism, unspecified: Secondary | ICD-10-CM

## 2015-08-04 MED ORDER — LEVALBUTEROL TARTRATE 45 MCG/ACT IN AERO: 2.0000 | INHALATION_SPRAY | RESPIRATORY_TRACT | Status: DC | PRN

## 2015-08-04 MED ORDER — LEVOTHYROXINE SODIUM 150 MCG PO TABS: 150.0000 ug | ORAL_TABLET | Freq: Every day | ORAL | Status: DC

## 2015-08-04 NOTE — Progress Notes (Signed)
Name: Maria Richardson   MRN: [id number]    DOB: January 12, 1961   Date:08/04/2015       Progress Note  Subjective  Chief Complaint  Chief Complaint  Patient presents with  . Labs Only    re-ck thyroid  . Medication Refill    asthma meds    HPI  Borderline DM: last hgb A1C was slightly better, down from 6.4% to 6.2% - she is following a diabetic diet. She denies polyphagia, polydipsia or polyuria. She is still eating sugary meals, and feels like she crashes afterwards, she is trying to eat better now.   Hypothyroidism: last TSH still above goal , but improving, taking medication Synthroid 150 mcg daily  ( she forgot about taking half on Sundays ), she is taking it before bed time now. She states she feels tired, but no tachycardia, no diarrhea, no chest pain.   Asthma Moderate: she denies wheezing, cough  or SOB, no nocturnal symptoms. Taking Advair one puff daily because two puffs causes tachycardia. Good exercise tolerance. Feeling well. She states rescue inhaler also causes tachycardia, we will try Xopenex  Hematochezia: resolved, colonoscopy showed a polyp and will have it repeated in 5 years.    Patient Active Problem List   Diagnosis Date Noted  . Dyslipidemia 08/04/2015  . Special screening for malignant neoplasms, colon   . Benign neoplasm of sigmoid colon   . Seasonal allergic rhinitis 01/07/2015  . Dermatitis, eczematoid 01/07/2015  . History of anemia 01/07/2015  . Excess weight 01/07/2015  . 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 Leiomyoma of uterus 01/07/2015  . Calculus of gallbladder 01/07/2015  . Acquired hypothyroidism 09/24/2009  . Asthma, moderate persistent, poorly-controlled 05/18/2009  . Vitamin D deficiency 05/18/2009    Past Surgical History  Procedure Laterality Date  . Oophorectomy      as teenager  . Appendectomy    . Colonoscopy with propofol N/A 07/23/2015    Procedure: COLONOSCOPY WITH PROPOFOL;  Surgeon: Lucilla Lame, MD;  Location: [place];   Service: Endoscopy;  Laterality: N/A;  PLEASE LEAVE PT AT LATER AM   . Polypectomy  07/23/2015    Procedure: POLYPECTOMY;  Surgeon: Lucilla Lame, MD;  Location: [place];  Service: Endoscopy;;    Family History  Problem Relation Age of Onset  . Diabetes Father   . Hypertension Father   . Kidney disease Father   . Colon cancer Father     Social History   Social History  . Marital Status: Married    Spouse Name: N/A  . Number of Children: N/A  . Years of Education: N/A   Occupational History  . Not on file.   Social History Main Topics  . Smoking status: Former Research scientist (life sciences)  . Smokeless tobacco: Not on file     Comment: quit 17 years ago  . Alcohol Use: No  . Drug Use: No  . Sexual Activity: Not Currently     Comment: no desire   Other Topics Concern  . Not on file   Social History Narrative     Current outpatient prescriptions:  .  Fluticasone-Salmeterol (ADVAIR DISKUS) 250-50 MCG/DOSE AEPB, Inhale 1 puff into the lungs 2 (two) times daily., Disp: 3 each, Rfl: 2 .  levothyroxine (SYNTHROID, LEVOTHROID) 150 MCG tablet, Take 1 tablet (150 mcg total) by mouth daily before breakfast., Disp: 35 tablet, Rfl: 1  Allergies  Allergen Reactions  . Codeine Nausea And Vomiting     ROS  Constitutional: Negative for fever ,  positive for  weight change.  Respiratory: Negative for cough and shortness of breath.   Cardiovascular: Negative for chest pain or palpitations.  Gastrointestinal: Negative for abdominal pain, no bowel changes.  Musculoskeletal: Negative for gait problem or joint swelling.  Skin: Negative for rash.  Neurological: Negative for dizziness or headache.  No other specific complaints in a complete review of systems (except as listed in HPI above).  Objective  Filed Vitals:   08/04/15 0933  BP: 106/70  Pulse: 76  Temp: 97.8 F (36.6 C)  TempSrc: Oral  Resp: 16  Weight: 144 lb 12.8 oz (65.681 kg)  SpO2: 97%    Body mass index is 27.37  kg/(m^2).  Physical Exam  Constitutional: Patient appears well-developed and well-nourished. Obese  No distress.  HEENT: head atraumatic, normocephalic, pupils equal and reactive to light,  neck supple, throat within normal limits. Normal thyroid  Cardiovascular: Normal rate, regular rhythm and normal heart sounds.  No murmur heard. No BLE edema. Pulmonary/Chest: Effort normal and breath sounds normal. No respiratory distress. Abdominal: Soft.  There is no tenderness. Psychiatric: Patient has a normal mood and affect. behavior is normal. Judgment and thought content normal.  Recent Results (from the past 2160 hour(s))  CBC with Differential/Platelet     Status: Abnormal   Collection Time: 05/11/15  3:25 PM  Result Value Ref Range   WBC 6.0 3.4 - 10.8 x10E3/uL   RBC 4.33 3.77 - 5.28 x10E6/uL   Hemoglobin 12.9 11.1 - 15.9 g/dL   Hematocrit 39.5 34.0 - 46.6 %   MCV 91 79 - 97 fL   MCH 29.8 26.6 - 33.0 pg   MCHC 32.7 31.5 - 35.7 g/dL   RDW 14.6 12.3 - 15.4 %   Platelets 270 150 - 379 x10E3/uL   Neutrophils 52 %   Lymphs 27 %   Monocytes 9 %   Eos 11 %   Basos 1 %   Neutrophils Absolute 3.1 1.4 - 7.0 x10E3/uL   Lymphocytes Absolute 1.6 0.7 - 3.1 x10E3/uL   Monocytes Absolute 0.6 0.1 - 0.9 x10E3/uL   EOS (ABSOLUTE) 0.6 (H) 0.0 - 0.4 x10E3/uL   Basophils Absolute 0.1 0.0 - 0.2 x10E3/uL   Immature Granulocytes 0 %   Immature Grans (Abs) 0.0 0.0 - 0.1 x10E3/uL  Ferritin     Status: Abnormal   Collection Time: 05/11/15  3:25 PM  Result Value Ref Range   Ferritin 304 (H) 15 - 150 ng/mL  TSH     Status: Abnormal   Collection Time: 05/11/15  3:25 PM  Result Value Ref Range   TSH 10.930 (H) 0.450 - 4.500 uIU/mL     PHQ2/9: Depression screen PHQ 2/9 08/04/2015  Decreased Interest 0  Down, Depressed, Hopeless 0  PHQ - 2 Score 0    Fall Risk: Fall Risk  08/04/2015  Falls in the past year? No    Functional Status Survey: Is the patient deaf or have difficulty hearing?: No Does  the patient have difficulty seeing, even when wearing glasses/contacts?: No Does the patient have difficulty concentrating, remembering, or making decisions?: No Does the patient have difficulty walking or climbing stairs?: No Does the patient have difficulty dressing or bathing?: No Does the patient have difficulty doing errands alone such as visiting a doctor's office or shopping?: No    Assessment & Plan  1. Acquired hypothyroidism  We will check TSH before sending refills to mail order - TSH - levothyroxine (SYNTHROID, LEVOTHROID) 150 MCG tablet; Take 1 tablet (  150 mcg total) by mouth daily before breakfast.  Dispense: 35 tablet; Refill: 1  2. Seasonal allergic rhinitis  stable  3. Asthma, moderate persistent, poorly-controlled  Well controlled now  4. Borderline diabetes  - Hemoglobin A1c  5. Vitamin D deficiency  - VITAMIN D 25 Hydroxy (Vit-D Deficiency, Fractures)  6. Dyslipidemia  - Lipid panel

## 2015-08-05 ENCOUNTER — Other Ambulatory Visit: Payer: Self-pay | Admitting: Family Medicine

## 2015-08-05 LAB — LIPID PANEL
CHOLESTEROL TOTAL: 164 mg/dL (ref 100–199)
Chol/HDL Ratio: 2.8 ratio units (ref 0.0–4.4)
HDL: 58 mg/dL (ref >39)
LDL Calculated: 97 mg/dL (ref 0–99)
Triglycerides: 47 mg/dL (ref 0–149)
VLDL Cholesterol Cal: 9 mg/dL (ref 5–40)

## 2015-08-05 LAB — VITAMIN D 25 HYDROXY (VIT D DEFICIENCY, FRACTURES): VIT D 25 HYDROXY: 16.3 ng/mL — AB (ref 30.0–100.0)

## 2015-08-05 LAB — HEMOGLOBIN A1C
ESTIMATED AVERAGE GLUCOSE: 131 mg/dL
Hgb A1c MFr Bld: 6.2 % — ABNORMAL HIGH (ref 4.8–5.6)

## 2015-08-05 LAB — TSH: TSH: 0.347 u[IU]/mL — AB (ref 0.450–4.500)

## 2015-08-05 MED ORDER — VITAMIN D (ERGOCALCIFEROL) 1.25 MG (50000 UNIT) PO CAPS: 50000.0000 [IU] | ORAL_CAPSULE | ORAL | Status: DC

## 2015-08-05 NOTE — Addendum Note (Signed)
Addended by: Inda Coke on: 08/05/2015 01:57 PM   Modules accepted: Orders

## 2015-08-31 ENCOUNTER — Other Ambulatory Visit: Payer: Self-pay | Admitting: Family Medicine

## 2015-08-31 NOTE — Telephone Encounter (Signed)
Patient requesting refill. 

## 2015-10-16 LAB — TSH: TSH: 5.67 u[IU]/mL — ABNORMAL HIGH (ref 0.450–4.500)

## 2015-10-20 ENCOUNTER — Telehealth: Payer: Self-pay | Admitting: Gastroenterology

## 2015-10-20 NOTE — Telephone Encounter (Signed)
Still some blood in stool. Could it be her diet? No heavy bleeding but blood on paper for about 3 weeks now.

## 2015-10-21 NOTE — Telephone Encounter (Signed)
Returned pts call regarding her rectal bleeding. Advised her she shouldn't be having rectal bleeding 3 months post colonoscopy. She stated its not a lot. Colonoscopy only showed a 98mm polyp was removed and no internal hemorrhoids noted. Pt requested a copy of colonoscopy and pathology. She will pick up these from our [place] office tomorrow.

## 2015-10-22 ENCOUNTER — Telehealth: Payer: Self-pay | Admitting: Family Medicine

## 2015-10-22 DIAGNOSIS — E039 Hypothyroidism, unspecified: Secondary | ICD-10-CM

## 2015-10-22 MED ORDER — LEVOTHYROXINE SODIUM 137 MCG PO TABS: 137.0000 ug | ORAL_TABLET | Freq: Every day | ORAL | Status: DC

## 2015-10-22 NOTE — Telephone Encounter (Signed)
Pt states she would like a call back about her Thyroid condition and her medication. Please return her call.

## 2015-10-22 NOTE — Telephone Encounter (Signed)
Patient stated that the only thing she did was not take it 2 days prior to the having labs drawn.  Patient only has 2 pills left.  After consulting with Dr. Ancil Boozer, patient was informed that Dr. Ancil Boozer will be sending a new RX of Levothyroxine 147mcg to her pharmacy and she is to take it daily. Then come back for repeat labs in 6 weeks. Patient asked that we mail her the lab order.   She is a Armed forces logistics/support/administrative officer.

## 2015-10-22 NOTE — Telephone Encounter (Signed)
Take in the morning with water, may use inhaler, changed to 137 mcg daily and needs to recheck labs in 6 weeks

## 2015-10-23 ENCOUNTER — Other Ambulatory Visit: Payer: Self-pay | Admitting: Family Medicine

## 2015-10-25 ENCOUNTER — Other Ambulatory Visit: Payer: Self-pay | Admitting: Family Medicine

## 2015-10-25 ENCOUNTER — Telehealth: Payer: Self-pay | Admitting: Family Medicine

## 2015-10-25 DIAGNOSIS — E039 Hypothyroidism, unspecified: Secondary | ICD-10-CM

## 2015-10-25 MED ORDER — LEVOTHYROXINE SODIUM 137 MCG PO TABS: 137.0000 ug | ORAL_TABLET | Freq: Every day | ORAL | 1 refills | Status: DC

## 2015-11-16 NOTE — Telephone Encounter (Signed)
Completed.

## 2015-12-04 LAB — TSH: TSH: 0.841 u[IU]/mL (ref 0.450–4.500)

## 2015-12-14 ENCOUNTER — Other Ambulatory Visit: Payer: Self-pay

## 2015-12-14 DIAGNOSIS — E039 Hypothyroidism, unspecified: Secondary | ICD-10-CM

## 2015-12-14 MED ORDER — LEVOTHYROXINE SODIUM 137 MCG PO TABS: 137.0000 ug | ORAL_TABLET | Freq: Every day | ORAL | 0 refills | Status: DC

## 2015-12-14 NOTE — Telephone Encounter (Signed)
Patient needed refills on her Thyroid medications due to waiting on blood results, which was told to continue same dose.

## 2016-02-01 ENCOUNTER — Other Ambulatory Visit: Payer: Self-pay | Admitting: Family Medicine

## 2016-02-01 DIAGNOSIS — E039 Hypothyroidism, unspecified: Secondary | ICD-10-CM

## 2016-02-01 NOTE — Telephone Encounter (Signed)
Patient requesting refill of Levothyroxine to [facility].

## 2016-02-02 NOTE — Telephone Encounter (Signed)
LMOM to inform pt to call office to schedule an appt

## 2016-02-04 ENCOUNTER — Ambulatory Visit (INDEPENDENT_AMBULATORY_CARE_PROVIDER_SITE_OTHER): Payer: 59

## 2016-02-04 DIAGNOSIS — Z23 Encounter for immunization: Secondary | ICD-10-CM | POA: Diagnosis not present

## 2016-05-01 ENCOUNTER — Other Ambulatory Visit: Payer: Self-pay | Admitting: Family Medicine

## 2016-05-02 NOTE — Telephone Encounter (Signed)
Pt states she does not need Advair right now but will call later to schedule.

## 2016-05-10 ENCOUNTER — Ambulatory Visit (INDEPENDENT_AMBULATORY_CARE_PROVIDER_SITE_OTHER): Payer: 59 | Admitting: Family Medicine

## 2016-05-10 ENCOUNTER — Encounter: Payer: Self-pay | Admitting: Family Medicine

## 2016-05-10 VITALS — BP 116/68 | HR 95 | Temp 97.2°F | Resp 16 | Ht 61.0 in | Wt 154.0 lb

## 2016-05-10 DIAGNOSIS — R7303 Prediabetes: Secondary | ICD-10-CM

## 2016-05-10 DIAGNOSIS — E039 Hypothyroidism, unspecified: Secondary | ICD-10-CM | POA: Diagnosis not present

## 2016-05-10 DIAGNOSIS — E559 Vitamin D deficiency, unspecified: Secondary | ICD-10-CM

## 2016-05-10 DIAGNOSIS — J4541 Moderate persistent asthma with (acute) exacerbation: Secondary | ICD-10-CM

## 2016-05-10 DIAGNOSIS — E663 Overweight: Secondary | ICD-10-CM

## 2016-05-10 DIAGNOSIS — E785 Hyperlipidemia, unspecified: Secondary | ICD-10-CM | POA: Diagnosis not present

## 2016-05-10 MED ORDER — VITAMIN D (ERGOCALCIFEROL) 1.25 MG (50000 UNIT) PO CAPS: 50000.0000 [IU] | ORAL_CAPSULE | ORAL | 0 refills | Status: DC

## 2016-05-10 MED ORDER — FLUTICASONE-SALMETEROL 250-50 MCG/DOSE IN AEPB: 1.0000 | INHALATION_SPRAY | Freq: Two times a day (BID) | RESPIRATORY_TRACT | 2 refills | Status: DC

## 2016-05-10 NOTE — Progress Notes (Signed)
Name: Maria Richardson   MRN: [id number]    DOB: [birth year]   Date:05/10/2016       Progress Note  Subjective  Chief Complaint  Chief Complaint  Patient presents with  . Medication Refill  . Asthma    HPI  Borderline DM: last hgb A1C was slightly better, down from 6.4% to 6.2% - she is following a diabetic diet. She denies polyphagia, polydipsia or polyuria. She stopped eating sweets, changed to bananas instead.   Hypothyroidism: last TSH was at goal, she has been  taking medication Synthroid 137  mcg daily. She has fatigue, but no tachycardia, no diarrhea, no chest pain.   Asthma Moderate: she ran out of Advair about two weeks ago and since than she has noticed SOB with activity, she has a daily cough ( she had the flu one month ago), no wheezing. She has been sleeping well at night.   Overweight: she has gained 7 lbs since last visit, she has hyperglycemia and family history of DM, she does not want to start medication.    Patient Active Problem List   Diagnosis Date Noted  . Dyslipidemia 08/04/2015  . Benign neoplasm of sigmoid colon   . Seasonal allergic rhinitis 01/07/2015  . Dermatitis, eczematoid 01/07/2015  . History of anemia 01/07/2015  . Excess weight 01/07/2015  . 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 Leiomyoma of uterus 01/07/2015  . Calculus of gallbladder 01/07/2015  . Acquired hypothyroidism 09/24/2009  . Asthma, moderate persistent, poorly-controlled 05/18/2009  . Vitamin D deficiency 05/18/2009    Past Surgical History:  Procedure Laterality Date  . APPENDECTOMY    . COLONOSCOPY WITH PROPOFOL N/A 07/23/2015   Procedure: COLONOSCOPY WITH PROPOFOL;  Surgeon: Lucilla Lame, MD;  Location: [place];  Service: Endoscopy;  Laterality: N/A;  PLEASE LEAVE PT AT [place]     as teenager  . POLYPECTOMY  07/23/2015   Procedure: POLYPECTOMY;  Surgeon: Lucilla Lame, MD;  Location: [place];  Service: Endoscopy;;    Family History   Problem Relation Age of Onset  . Diabetes Father   . Hypertension Father   . Kidney disease Father   . Colon cancer Father     Social History   Social History  . Marital status: Married    Spouse name: N/A  . Number of children: N/A  . Years of education: N/A   Occupational History  . Not on file.   Social History Main Topics  . Smoking status: Former Research scientist (life sciences)  . Smokeless tobacco: Not on file     Comment: quit 17 years ago  . Alcohol use No  . Drug use: No  . Sexual activity: Not Currently     Comment: no desire   Other Topics Concern  . Not on file   Social History Narrative  . No narrative on file     Current Outpatient Prescriptions:  .  Fluticasone-Salmeterol (ADVAIR DISKUS) 250-50 MCG/DOSE AEPB, Inhale 1 puff into the lungs 2 (two) times daily., Disp: 3 each, Rfl: 2 .  levalbuterol (XOPENEX HFA) 45 MCG/ACT inhaler, Inhale 2 puffs into the lungs every 4 (four) hours as needed for wheezing., Disp: 1 Inhaler, Rfl: 12 .  levothyroxine (SYNTHROID, LEVOTHROID) 137 MCG tablet, TAKE 1 TABLET BY MOUTH  DAILY BEFORE BREAKFAST, Disp: 30 tablet, Rfl: 0 .  Vitamin D, Ergocalciferol, (DRISDOL) 50000 units CAPS capsule, Take 1 capsule (50,000 Units total) by mouth every 7 (seven) days., Disp: 12 capsule, Rfl: 0  Allergies  Allergen Reactions  . Codeine Nausea And Vomiting     ROS  Constitutional: Negative for fever , positive for  weight change.  Respiratory: Negative for cough but has noticed increase in  shortness of breath.   Cardiovascular: Negative for chest pain or palpitations.  Gastrointestinal: Negative for abdominal pain, no bowel changes.  Musculoskeletal: Negative for gait problem or joint swelling.  Skin: Negative for rash.  Neurological: Negative for dizziness or headache.  No other specific complaints in a complete review of systems (except as listed in HPI above).  Objective  Vitals:   05/10/16 1352  BP: 116/68  Pulse: 95  Resp: 16  Temp: 97.2  F (36.2 C)  TempSrc: Oral  SpO2: 98%  Weight: 154 lb (69.9 kg)  Height: 5\' 1"  (1.549 m)    Body mass index is 29.1 kg/m.  Physical Exam  Constitutional: Patient appears well-developed and well-nourished. Obese  No distress.  HEENT: head atraumatic, normocephalic, pupils equal and reactive to light, neck supple, throat within normal limits Cardiovascular: Normal rate, regular rhythm and normal heart sounds.  No murmur heard. No BLE edema. Pulmonary/Chest: Effort normal and breath sounds normal. No respiratory distress. Abdominal: Soft.  There is no tenderness. Psychiatric: Patient has a normal mood and affect. behavior is normal. Judgment and thought content normal.  PHQ2/9: Depression screen PHQ 2/9 08/04/2015  Decreased Interest 0  Down, Depressed, Hopeless 0  PHQ - 2 Score 0    Fall Risk: Fall Risk  08/04/2015  Falls in the past year? No     Assessment & Plan  1. Acquired hypothyroidism  - TSH  2. Asthma, moderate persistent, poorly-controlled  - Fluticasone-Salmeterol (ADVAIR DISKUS) 250-50 MCG/DOSE AEPB; Inhale 1 puff into the lungs 2 (two) times daily.  Dispense: 3 each; Refill: 2  3. Vitamin D deficiency  - Vitamin D, Ergocalciferol, (DRISDOL) 50000 units CAPS capsule; Take 1 capsule (50,000 Units total) by mouth every 7 (seven) days.  Dispense: 12 capsule; Refill: 0  4. Dyslipidemia  Recheck during her physical   5. Borderline diabetes  She has gained weight discussed GLP1 agonist but she wants to hold off for now

## 2016-05-12 ENCOUNTER — Encounter: Payer: Self-pay | Admitting: Family Medicine

## 2016-06-13 ENCOUNTER — Encounter: Payer: Self-pay | Admitting: Emergency Medicine

## 2016-06-13 ENCOUNTER — Emergency Department
Admission: EM | Admit: 2016-06-13 | Discharge: 2016-06-13 | Disposition: A | Discharge status: Home / Self Care | Payer: 59 | Attending: Emergency Medicine | Admitting: Emergency Medicine

## 2016-06-13 ENCOUNTER — Emergency Department: Payer: 59

## 2016-06-13 DIAGNOSIS — Z87891 Personal history of nicotine dependence: Secondary | ICD-10-CM | POA: Insufficient documentation

## 2016-06-13 DIAGNOSIS — K529 Noninfective gastroenteritis and colitis, unspecified: Secondary | ICD-10-CM | POA: Insufficient documentation

## 2016-06-13 DIAGNOSIS — J45909 Unspecified asthma, uncomplicated: Secondary | ICD-10-CM | POA: Diagnosis not present

## 2016-06-13 DIAGNOSIS — E039 Hypothyroidism, unspecified: Secondary | ICD-10-CM | POA: Insufficient documentation

## 2016-06-13 DIAGNOSIS — R109 Unspecified abdominal pain: Secondary | ICD-10-CM

## 2016-06-13 DIAGNOSIS — R112 Nausea with vomiting, unspecified: Secondary | ICD-10-CM | POA: Diagnosis present

## 2016-06-13 DIAGNOSIS — Z79899 Other long term (current) drug therapy: Secondary | ICD-10-CM | POA: Diagnosis not present

## 2016-06-13 LAB — CBC
HCT: 42.4 % (ref 35.0–47.0)
Hemoglobin: 14.2 g/dL (ref 12.0–16.0)
MCH: 29.7 pg (ref 26.0–34.0)
MCHC: 33.5 g/dL (ref 32.0–36.0)
MCV: 88.6 fL (ref 80.0–100.0)
Platelets: 237 10*3/uL (ref 150–440)
RBC: 4.79 MIL/uL (ref 3.80–5.20)
RDW: 15.5 % — ABNORMAL HIGH (ref 11.5–14.5)
WBC: 7.9 10*3/uL (ref 3.6–11.0)

## 2016-06-13 LAB — COMPREHENSIVE METABOLIC PANEL
ALK PHOS: 86 U/L (ref 38–126)
ALT: 28 U/L (ref 14–54)
AST: 26 U/L (ref 15–41)
Albumin: 4 g/dL (ref 3.5–5.0)
Anion gap: 7 (ref 5–15)
BILIRUBIN TOTAL: 0.7 mg/dL (ref 0.3–1.2)
BUN: 18 mg/dL (ref 6–20)
CALCIUM: 9.2 mg/dL (ref 8.9–10.3)
CO2: 25 mmol/L (ref 22–32)
CREATININE: 0.7 mg/dL (ref 0.44–1.00)
Chloride: 105 mmol/L (ref 101–111)
GFR calc Af Amer: >60 mL/min (ref >60)
GFR calc non Af Amer: >60 mL/min (ref >60)
Glucose, Bld: 130 mg/dL — ABNORMAL HIGH (ref 65–99)
Potassium: 4 mmol/L (ref 3.5–5.1)
Sodium: 137 mmol/L (ref 135–145)
TOTAL PROTEIN: 8.4 g/dL — AB (ref 6.5–8.1)

## 2016-06-13 LAB — URINALYSIS, COMPLETE (UACMP) WITH MICROSCOPIC
Bilirubin Urine: NEGATIVE
Glucose, UA: NEGATIVE mg/dL
Ketones, ur: 5 mg/dL — AB
Leukocytes, UA: NEGATIVE
NITRITE: NEGATIVE
PH: 5 (ref 5.0–8.0)
PROTEIN: 100 mg/dL — AB
Specific Gravity, Urine: 1.027 (ref 1.005–1.030)

## 2016-06-13 LAB — LIPASE, BLOOD: Lipase: 15 U/L (ref 11–51)

## 2016-06-13 MED ORDER — ONDANSETRON 4 MG PO TBDP
4.0000 mg | ORAL_TABLET | Freq: Once | ORAL | Status: AC
  Administered 2016-06-13: 4 mg via ORAL
  Filled 2016-06-13: qty 1

## 2016-06-13 MED ORDER — DICYCLOMINE HCL 10 MG PO CAPS
10.0000 mg | ORAL_CAPSULE | Freq: Once | ORAL | Status: AC
  Administered 2016-06-13: 10 mg via ORAL
  Filled 2016-06-13: qty 1

## 2016-06-13 MED ORDER — ONDANSETRON HCL 4 MG PO TABS: 4.0000 mg | ORAL_TABLET | Freq: Three times a day (TID) | ORAL | 0 refills | Status: DC | PRN

## 2016-06-13 MED ORDER — DICYCLOMINE HCL 20 MG PO TABS: 20.0000 mg | ORAL_TABLET | Freq: Three times a day (TID) | ORAL | 0 refills | Status: DC | PRN

## 2016-06-13 NOTE — ED Provider Notes (Signed)
[facility] Emergency Department Provider Note   ____________________________________________   I have reviewed the triage vital signs and the nursing notes.   HISTORY  Chief Complaint Abdominal Pain   History limited by: Not Limited   HPI Maria Richardson is a 56 y.o. female who presents to the emergency department today because of concerns for abdominal pain nausea and vomiting. The patient states that her symptoms started this morning. She has had multiple episodes of nonbloody emesis. She describes the pain as being located across her mid abdomen. She does describe it as cramping with occasional sharp episodes. Patient denies any diarrhea. Denies any fevers.   Past Medical History:  Diagnosis Date  . Asthma   . Eczema   . Hypoglycemia   . Hypothyroidism   . Shortness of breath dyspnea    because of asthma  . Wears dentures    full lower    Patient Active Problem List   Diagnosis Date Noted  . Dyslipidemia 08/04/2015  . Benign neoplasm of sigmoid colon   . Seasonal allergic rhinitis 01/07/2015  . Dermatitis, eczematoid 01/07/2015  . History of anemia 01/07/2015  . Excess weight 01/07/2015  . 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 Leiomyoma of uterus 01/07/2015  . Calculus of gallbladder 01/07/2015  . Acquired hypothyroidism 09/24/2009  . Asthma, moderate persistent, poorly-controlled 05/18/2009  . Vitamin D deficiency 05/18/2009    Past Surgical History:  Procedure Laterality Date  . APPENDECTOMY    . COLONOSCOPY WITH PROPOFOL N/A 07/23/2015   Procedure: COLONOSCOPY WITH PROPOFOL;  Surgeon: Lucilla Lame, MD;  Location: [place];  Service: Endoscopy;  Laterality: N/A;  PLEASE LEAVE PT AT [place]     as teenager  . POLYPECTOMY  07/23/2015   Procedure: POLYPECTOMY;  Surgeon: Lucilla Lame, MD;  Location: [place];  Service: Endoscopy;;    Prior to Admission medications   Medication Sig Start Date End  Date Taking? Authorizing Provider  Fluticasone-Salmeterol (ADVAIR DISKUS) 250-50 MCG/DOSE AEPB Inhale 1 puff into the lungs 2 (two) times daily. 05/10/16   Steele Sizer, MD  levalbuterol [facility] HFA) 45 MCG/ACT inhaler Inhale 2 puffs into the lungs every 4 (four) hours as needed for wheezing. 08/04/15   Steele Sizer, MD  levothyroxine (SYNTHROID, LEVOTHROID) 137 MCG tablet TAKE 1 TABLET BY MOUTH  DAILY BEFORE BREAKFAST 02/01/16   Steele Sizer, MD  Vitamin D, Ergocalciferol, (DRISDOL) 50000 units CAPS capsule Take 1 capsule (50,000 Units total) by mouth every 7 (seven) days. 05/10/16   Steele Sizer, MD    Allergies Codeine  Family History  Problem Relation Age of Onset  . Diabetes Father   . Hypertension Father   . Kidney disease Father   . Colon cancer Father     Social History Social History  Substance Use Topics  . Smoking status: Former Research scientist (life sciences)  . Smokeless tobacco: Never Used     Comment: quit 17 years ago  . Alcohol use No    Review of Systems  Constitutional: Negative for fever. Cardiovascular: Negative for chest pain. Respiratory: Negative for shortness of breath. Gastrointestinal: Positive for abdominal pain and vomiting.  Genitourinary: Negative for dysuria. Musculoskeletal: Negative for back pain. Skin: Negative for rash. Neurological: Negative for headaches, focal weakness or numbness.  10-point ROS otherwise negative.  ____________________________________________   PHYSICAL EXAM:  VITAL SIGNS: ED Triage Vitals  Enc Vitals Group     BP 06/13/16 1736 113/84     Pulse Rate 06/13/16 1736 (!)  105     Resp 06/13/16 1736 18     Temp 06/13/16 1736 99.4 F (37.4 C)     Temp Source 06/13/16 1736 Oral     SpO2 06/13/16 1736 97 %     Weight 06/13/16 1736 154 lb (69.9 kg)     Height 06/13/16 1736 5' (1.524 m)     Head Circumference --      Peak Flow --      Pain Score 06/13/16 1738 4   Constitutional: Alert and oriented. Well appearing and in no  distress. Eyes: Conjunctivae are normal. Normal extraocular movements. ENT   Head: Normocephalic and atraumatic.   Nose: No congestion/rhinnorhea.   Mouth/Throat: Mucous membranes are moist.   Neck: No stridor. Hematological/Lymphatic/Immunilogical: No cervical lymphadenopathy. Cardiovascular: Normal rate, regular rhythm.  No murmurs, rubs, or gallops.  Respiratory: Normal respiratory effort without tachypnea nor retractions. Breath sounds are clear and equal bilaterally. No wheezes/rales/rhonchi. Gastrointestinal: Soft and non tender. No rebound. No guarding.  Genitourinary: Deferred Musculoskeletal: Normal range of motion in all extremities. No lower extremity edema. Neurologic:  Normal speech and language. No gross focal neurologic deficits are appreciated.  Skin:  Skin is warm, dry and intact. No rash noted. Psychiatric: Mood and affect are normal. Speech and behavior are normal. Patient exhibits appropriate insight and judgment.  ____________________________________________    LABS (pertinent positives/negatives)  Labs Reviewed  COMPREHENSIVE METABOLIC PANEL - Abnormal; Notable for the following:       Result Value   Glucose, Bld 130 (*)    Total Protein 8.4 (*)    All other components within normal limits  CBC - Abnormal; Notable for the following:    RDW 15.5 (*)    All other components within normal limits  URINALYSIS, COMPLETE (UACMP) WITH MICROSCOPIC - Abnormal; Notable for the following:    Color, Urine YELLOW (*)    APPearance HAZY (*)    Hgb urine dipstick MODERATE (*)    Ketones, ur 5 (*)    Protein, ur 100 (*)    Bacteria, UA RARE (*)    Squamous Epithelial / LPF 0-5 (*)    All other components within normal limits  LIPASE, BLOOD    ____________________________________________   EKG  None  ____________________________________________    RADIOLOGY  abd x-ray IMPRESSION:  Nonobstructed bowel-gas pattern.        ____________________________________________   PROCEDURES  Procedures  ____________________________________________   INITIAL IMPRESSION / ASSESSMENT AND PLAN / ED COURSE  Pertinent labs & imaging results that were available during my care of the patient were reviewed by me and considered in my medical decision making (see chart for details).  Patient presented to the emergency department today with concerns for nausea vomiting and some abdominal discomfort. All x-rays without concerning findings. Blood work and urine without overly concerning findings. Patient did feel better after Bentyl and Zofran. At this point I think patient likely with gastroenteritis. Will give patient prescription for the same.  ____________________________________________   FINAL CLINICAL IMPRESSION(S) / ED DIAGNOSES  Final diagnoses:  Abdominal pain  Gastroenteritis     Note: This dictation was prepared with Dragon dictation. Any transcriptional errors that result from this process are unintentional     Nance Pear, MD 06/13/16 2253

## 2016-06-13 NOTE — ED Triage Notes (Signed)
Patient presents to ED via POV with c/o abdominal that started this morning. Patient c/o N/V. Denies diarrhea. A&O x4.

## 2016-06-13 NOTE — Discharge Instructions (Signed)
Please seek medical attention for any high fevers, chest pain, shortness of breath, change in behavior, persistent vomiting, bloody stool or any other new or concerning symptoms.  

## 2016-08-07 ENCOUNTER — Ambulatory Visit: Payer: 59 | Admitting: Family Medicine

## 2016-08-31 ENCOUNTER — Ambulatory Visit: Payer: 59 | Admitting: Family Medicine

## 2016-09-01 ENCOUNTER — Ambulatory Visit (INDEPENDENT_AMBULATORY_CARE_PROVIDER_SITE_OTHER): Payer: 59 | Admitting: Family Medicine

## 2016-09-01 ENCOUNTER — Encounter: Payer: Self-pay | Admitting: Family Medicine

## 2016-09-01 VITALS — BP 124/70 | HR 81 | Temp 97.6°F | Resp 16 | Ht 60.0 in | Wt 150.5 lb

## 2016-09-01 DIAGNOSIS — J4540 Moderate persistent asthma, uncomplicated: Secondary | ICD-10-CM

## 2016-09-01 DIAGNOSIS — R7303 Prediabetes: Secondary | ICD-10-CM | POA: Diagnosis not present

## 2016-09-01 DIAGNOSIS — E559 Vitamin D deficiency, unspecified: Secondary | ICD-10-CM

## 2016-09-01 DIAGNOSIS — E039 Hypothyroidism, unspecified: Secondary | ICD-10-CM | POA: Diagnosis not present

## 2016-09-01 MED ORDER — LEVOTHYROXINE SODIUM 137 MCG PO TABS: 137.0000 ug | ORAL_TABLET | Freq: Every day | ORAL | 0 refills | Status: DC

## 2016-09-01 NOTE — Addendum Note (Signed)
Addended by: Hubbard Hartshorn on: 09/01/2016 03:18 PM   Modules accepted: Orders

## 2016-09-01 NOTE — Addendum Note (Signed)
Addended by: Hubbard Hartshorn on: 09/01/2016 03:20 PM   Modules accepted: Orders

## 2016-09-01 NOTE — Progress Notes (Addendum)
Name: Maria Richardson   MRN: [id number]    DOB: [birth year]   Date:09/01/2016       Progress Note  Subjective  Chief Complaint  Chief Complaint  Patient presents with  . Hypothyroidism    refill medication    HPI  Borderline DM: last hgb A1C was 6.2% - she is following a diabetic diet. She denies polyphagia, polydipsia or polyuria. She stopped eating sweets, changed to bananas instead.   Hypothyroidism: last TSH (drawn 12/03/15) was at goal, she has been taking medication Synthroid 137  mcg daily. She has fatigue, but no tachycardia, no diarrhea, no chest pain. We will refill Synthroid for 30 days, then send 90 day supply to [facility] once TSH level is back.  Asthma Moderate: Taking Advair daily and does well with not shortness of breath, no wheezing. She has been sleeping well at night. Does not use Levalbuterol because it was too expensive and does not want albuterol inhaler because it makes her jittery. Encouraged patient to consider having one of these rescue inhalers on hand.  Overweight: she has lost 4lbs since last visit, she has hyperglycemia and family history of DM, she does not want to start medication.   Vitamin D Deficiency: Denies fatigue, has history of Vitamin D deficiency, does not go outside often. She has not been taking her Vitamin D supplement - took one 3 weeks ago and has 3 left over. She will start taking the 50,000 unit supplements for the next 3 weeks. If Vitamin D level is >20 today, we will have her finish out the 3 pills she has left and start supplementing with 1000units OTC.   Patient Active Problem List   Diagnosis Date Noted  . Dyslipidemia 08/04/2015  . Benign neoplasm of sigmoid colon   . Seasonal allergic rhinitis 01/07/2015  . Dermatitis, eczematoid 01/07/2015  . History of anemia 01/07/2015  . Excess weight 01/07/2015  . 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 Leiomyoma of uterus 01/07/2015  . Calculus of gallbladder 01/07/2015  . Acquired hypothyroidism  09/24/2009  . Asthma, moderate persistent, poorly-controlled 05/18/2009  . Vitamin D deficiency 05/18/2009    Past Surgical History:  Procedure Laterality Date  . APPENDECTOMY    . COLONOSCOPY WITH PROPOFOL N/A 07/23/2015   Procedure: COLONOSCOPY WITH PROPOFOL;  Surgeon: Lucilla Lame, MD;  Location: [place];  Service: Endoscopy;  Laterality: N/A;  PLEASE LEAVE PT AT [place]     as teenager  . POLYPECTOMY  07/23/2015   Procedure: POLYPECTOMY;  Surgeon: Lucilla Lame, MD;  Location: [place];  Service: Endoscopy;;    Family History  Problem Relation Age of Onset  . Diabetes Father   . Hypertension Father   . Kidney disease Father   . Colon cancer Father     Social History   Social History  . Marital status: Married    Spouse name: N/A  . Number of children: N/A  . Years of education: N/A   Occupational History  . Not on file.   Social History Main Topics  . Smoking status: Former Research scientist (life sciences)  . Smokeless tobacco: Never Used     Comment: quit 17 years ago  . Alcohol use No  . Drug use: No  . Sexual activity: Not Currently     Comment: no desire   Other Topics Concern  . Not on file   Social History Narrative  . No narrative on file     Current Outpatient Prescriptions:  .  Fluticasone-Salmeterol (ADVAIR DISKUS) 250-50 MCG/DOSE AEPB, Inhale 1 puff into the lungs 2 (two) times daily., Disp: 3 each, Rfl: 2 .  levothyroxine (SYNTHROID, LEVOTHROID) 137 MCG tablet, TAKE 1 TABLET BY MOUTH  DAILY BEFORE BREAKFAST, Disp: 30 tablet, Rfl: 0 .  levalbuterol (XOPENEX HFA) 45 MCG/ACT inhaler, Inhale 2 puffs into the lungs every 4 (four) hours as needed for wheezing. (Patient not taking: Reported on 09/01/2016), Disp: 1 Inhaler, Rfl: 12 .  Vitamin D, Ergocalciferol, (DRISDOL) 50000 units CAPS capsule, Take 1 capsule (50,000 Units total) by mouth every 7 (seven) days. (Patient not taking: Reported on 09/01/2016), Disp: 12 capsule, Rfl: 0  Allergies   Allergen Reactions  . Codeine Nausea And Vomiting     ROS Constitutional: Negative for fever or weight change.  Respiratory: Negative for cough and shortness of breath.   Cardiovascular: Negative for chest pain or palpitations.  Gastrointestinal: Negative for abdominal pain, no bowel changes.  Musculoskeletal: Negative for gait problem or joint swelling.  Skin: Negative for rash.  Neurological: Negative for dizziness or headache.  No other specific complaints in a complete review of systems (except as listed in HPI above).  Objective  Vitals:   09/01/16 1444  BP: 124/70  Pulse: 81  Resp: 16  Temp: 97.6 F (36.4 C)  TempSrc: Oral  SpO2: 93%  Weight: 150 lb 8 oz (68.3 kg)  Height: 5' (1.524 m)    Body mass index is 29.39 kg/m.  Physical Exam Constitutional: Patient appears well-developed and well-nourished. Overweight No distress.  HEENT: head atraumatic, normocephalic Cardiovascular: Normal rate, regular rhythm and normal heart sounds.  No murmur heard. No BLE edema. Pulmonary/Chest: Effort normal and breath sounds normal. No respiratory distress. Abdominal: Soft.  There is no tenderness. Psychiatric: Patient has a normal mood and affect. behavior is normal. Judgment and thought content normal.   Recent Results (from the past 2160 hour(s))  Lipase, blood     Status: None   Collection Time: 06/13/16  5:40 PM  Result Value Ref Range   Lipase 15 11 - 51 U/L  Comprehensive metabolic panel     Status: Abnormal   Collection Time: 06/13/16  5:40 PM  Result Value Ref Range   Sodium 137 135 - 145 mmol/L   Potassium 4.0 3.5 - 5.1 mmol/L   Chloride 105 101 - 111 mmol/L   CO2 25 22 - 32 mmol/L   Glucose, Bld 130 (H) 65 - 99 mg/dL   BUN 18 6 - 20 mg/dL   Creatinine, Ser 0.70 0.44 - 1.00 mg/dL   Calcium 9.2 8.9 - 10.3 mg/dL   Total Protein 8.4 (H) 6.5 - 8.1 g/dL   Albumin 4.0 3.5 - 5.0 g/dL   AST 26 15 - 41 U/L   ALT 28 14 - 54 U/L   Alkaline Phosphatase 86 38 - 126  U/L   Total Bilirubin 0.7 0.3 - 1.2 mg/dL   GFR calc non Af Amer >60 >60 mL/min   GFR calc Af Amer >60 >60 mL/min    Comment: (NOTE) The eGFR has been calculated using the CKD EPI equation. This calculation has not been validated in all clinical situations. eGFR's persistently <60 mL/min signify possible Chronic Kidney Disease.    Anion gap 7 5 - 15  CBC     Status: Abnormal   Collection Time: 06/13/16  5:40 PM  Result Value Ref Range   WBC 7.9 3.6 - 11.0 K/uL   RBC 4.79 3.80 - 5.20 MIL/uL   Hemoglobin 14.2  12.0 - 16.0 g/dL   HCT 42.4 35.0 - 47.0 %   MCV 88.6 80.0 - 100.0 fL   MCH 29.7 26.0 - 34.0 pg   MCHC 33.5 32.0 - 36.0 g/dL   RDW 15.5 (H) 11.5 - 14.5 %   Platelets 237 150 - 440 K/uL  Urinalysis, Complete w Microscopic     Status: Abnormal   Collection Time: 06/13/16  5:40 PM  Result Value Ref Range   Color, Urine YELLOW (A) YELLOW   APPearance HAZY (A) CLEAR   Specific Gravity, Urine 1.027 1.005 - 1.030   pH 5.0 5.0 - 8.0   Glucose, UA NEGATIVE NEGATIVE mg/dL   Hgb urine dipstick MODERATE (A) NEGATIVE   Bilirubin Urine NEGATIVE NEGATIVE   Ketones, ur 5 (A) NEGATIVE mg/dL   Protein, ur 100 (A) NEGATIVE mg/dL   Nitrite NEGATIVE NEGATIVE   Leukocytes, UA NEGATIVE NEGATIVE   RBC / HPF 0-5 0 - 5 RBC/hpf   WBC, UA 0-5 0 - 5 WBC/hpf   Bacteria, UA RARE (A) NONE SEEN   Squamous Epithelial / LPF 0-5 (A) NONE SEEN   Mucous PRESENT    PHQ2/9: Depression screen [facility] 2/9 08/04/2015  Decreased Interest 0  Down, Depressed, Hopeless 0  PHQ - 2 Score 0    Assessment & Plan  1. Acquired hypothyroidism  - levothyroxine (SYNTHROID, LEVOTHROID) 137 MCG tablet; Take 1 tablet (137 mcg total) by mouth daily before breakfast.  Dispense: 30 tablet; Refill: 0 - TSH  2. Vitamin D deficiency  - VITAMIN D 25 Hydroxy (Vit-D Deficiency, Fractures)  3. Borderline diabetes  - Hemoglobin A1c  4. Asthma, moderate persistent, poorly-controlled Stable on Advair, refuses rescue  inhaler.  -Reviewed Health Maintenance: Needs to schedule a Pap and Mammogram. Will schedule CPE in the next 2-3 months with Dr. Ancil Boozer.  I have reviewed this encounter including the documentation in this note and/or discussed this patient with the Johney Maine, FNP, NP-C. I am certifying that I agree with the content of this note as supervising physician.  Steele Sizer, MD [facility] 09/10/2016, 4:22 PM

## 2016-09-01 NOTE — Patient Instructions (Signed)
Please schedule your annual physical with Dr. Ancil Boozer as soon as possible.

## 2016-09-02 LAB — HEMOGLOBIN A1C
Est. average glucose Bld gHb Est-mCnc: 123 mg/dL
Hgb A1c MFr Bld: 5.9 % — ABNORMAL HIGH (ref 4.8–5.6)

## 2016-09-02 LAB — TSH: TSH: 8.65 u[IU]/mL — ABNORMAL HIGH (ref 0.450–4.500)

## 2016-09-02 LAB — VITAMIN D 25 HYDROXY (VIT D DEFICIENCY, FRACTURES): VIT D 25 HYDROXY: 25 ng/mL — AB (ref 30.0–100.0)

## 2016-09-04 MED ORDER — LEVOTHYROXINE SODIUM 137 MCG PO TABS: 137.0000 ug | ORAL_TABLET | Freq: Every day | ORAL | 0 refills | Status: DC

## 2016-09-04 NOTE — Progress Notes (Signed)
Also please have patient schedule a 3 month CPE. Thank you!

## 2016-09-04 NOTE — Addendum Note (Signed)
Addended by: Hubbard Hartshorn on: 09/04/2016 07:50 AM   Modules accepted: Orders

## 2016-10-25 ENCOUNTER — Other Ambulatory Visit: Payer: Self-pay | Admitting: Family Medicine

## 2016-10-25 DIAGNOSIS — E039 Hypothyroidism, unspecified: Secondary | ICD-10-CM

## 2016-10-26 NOTE — Telephone Encounter (Signed)
She needs repeat TSH, already ordered by Raquel Sarna, please ask patient to come in so we can adjust medication. Last TSH was elevated

## 2016-10-26 NOTE — Telephone Encounter (Signed)
Tried calling patient to inform of medication and labs. Could not leave a message

## 2016-10-26 NOTE — Telephone Encounter (Signed)
Patient requesting refill of Levothyroxine to [facility].  

## 2016-10-30 ENCOUNTER — Other Ambulatory Visit: Payer: Self-pay

## 2016-10-30 DIAGNOSIS — E039 Hypothyroidism, unspecified: Secondary | ICD-10-CM

## 2016-10-30 NOTE — Telephone Encounter (Signed)
Patient requesting refill of Synthroid to [facility].  

## 2016-11-03 MED ORDER — LEVOTHYROXINE SODIUM 137 MCG PO TABS: 137.0000 ug | ORAL_TABLET | Freq: Every day | ORAL | 0 refills | Status: DC

## 2017-01-02 ENCOUNTER — Other Ambulatory Visit: Payer: Self-pay | Admitting: Family Medicine

## 2017-01-02 DIAGNOSIS — E039 Hypothyroidism, unspecified: Secondary | ICD-10-CM

## 2017-01-02 NOTE — Telephone Encounter (Signed)
Patient requesting refill of Levothyroxine to [facility].

## 2017-01-02 NOTE — Telephone Encounter (Signed)
She needs to come in for labs and follow up, I need to know before adjusting dose

## 2017-01-03 NOTE — Telephone Encounter (Signed)
LMOM for pt to call and schedule an appt.

## 2017-01-04 NOTE — Telephone Encounter (Signed)
lvm for patient to return call to schedule appt

## 2017-01-08 NOTE — Telephone Encounter (Signed)
LVM for pt to return call to schedule appt

## 2017-01-10 ENCOUNTER — Ambulatory Visit (INDEPENDENT_AMBULATORY_CARE_PROVIDER_SITE_OTHER): Payer: 59 | Admitting: Family Medicine

## 2017-01-10 ENCOUNTER — Encounter: Payer: Self-pay | Admitting: Family Medicine

## 2017-01-10 VITALS — BP 112/60 | HR 76 | Temp 97.4°F | Resp 16 | Ht 61.0 in | Wt 152.7 lb

## 2017-01-10 DIAGNOSIS — E785 Hyperlipidemia, unspecified: Secondary | ICD-10-CM | POA: Diagnosis not present

## 2017-01-10 DIAGNOSIS — Z23 Encounter for immunization: Secondary | ICD-10-CM | POA: Diagnosis not present

## 2017-01-10 DIAGNOSIS — J4541 Moderate persistent asthma with (acute) exacerbation: Secondary | ICD-10-CM

## 2017-01-10 DIAGNOSIS — E039 Hypothyroidism, unspecified: Secondary | ICD-10-CM | POA: Diagnosis not present

## 2017-01-10 DIAGNOSIS — E559 Vitamin D deficiency, unspecified: Secondary | ICD-10-CM

## 2017-01-10 DIAGNOSIS — R7303 Prediabetes: Secondary | ICD-10-CM | POA: Diagnosis not present

## 2017-01-10 MED ORDER — FLUTICASONE FUROATE-VILANTEROL 100-25 MCG/INH IN AEPB: 1.0000 | INHALATION_SPRAY | Freq: Every day | RESPIRATORY_TRACT | 0 refills | Status: DC

## 2017-01-10 MED ORDER — VITAMIN D 50 MCG (2000 UT) PO CAPS: 1.0000 | ORAL_CAPSULE | Freq: Every day | ORAL | 0 refills | Status: DC

## 2017-01-10 NOTE — Progress Notes (Signed)
Name: Maria Richardson   MRN: [id number]    DOB: 07-27-1960   Date:01/10/2017       Progress Note  Subjective  Chief Complaint  Chief Complaint  Patient presents with  . Cough    Onset-week, congested, coughing up mucus-clear/yellow. Constant cough and worst when she talks alot. Has tried Mucinex and cough drops.   . Asthma    HPI   Borderline DM:  hgb A1C 6.4% to 6.2%  Last visit down to 5.9% - she is following a diabetic diet. She denies polyphagia, polydipsia or polyuria. She stopped eating sweets, changed to bananas instead.   Hypothyroidism: last TSH was at goal, she has been  taking medication Synthroid 137  mcg most days, but she skips medication.  She has been tired since asthma flare, no constipation, weight is stable.  Asthma Moderate: she ran out of Advair about two weeks ago and since than she has noticed SOB with activity, she has a daily dry cough and  wheezing. She does not like using rescue inhaler. She resumed Advair this morning. She only uses Advair once a day and we will try to change to Breo  Dyslipidemia: on life style modification only    Patient Active Problem List   Diagnosis Date Noted  . Dyslipidemia 08/04/2015  . Benign neoplasm of sigmoid colon   . Seasonal allergic rhinitis 01/07/2015  . Dermatitis, eczematoid 01/07/2015  . History of anemia 01/07/2015  . Excess weight 01/07/2015  . 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 Leiomyoma of uterus 01/07/2015  . Calculus of gallbladder 01/07/2015  . Acquired hypothyroidism 09/24/2009  . Asthma, moderate persistent, poorly-controlled 05/18/2009  . Vitamin D deficiency 05/18/2009    Past Surgical History:  Procedure Laterality Date  . APPENDECTOMY    . COLONOSCOPY WITH PROPOFOL N/A 07/23/2015   Procedure: COLONOSCOPY WITH PROPOFOL;  Surgeon: Lucilla Lame, MD;  Location: [place];  Service: Endoscopy;  Laterality: N/A;  PLEASE LEAVE PT AT [place]     as teenager  .  POLYPECTOMY  07/23/2015   Procedure: POLYPECTOMY;  Surgeon: Lucilla Lame, MD;  Location: [place];  Service: Endoscopy;;    Family History  Problem Relation Age of Onset  . Diabetes Father   . Hypertension Father   . Kidney disease Father   . Colon cancer Father     Social History   Social History  . Marital status: Married    Spouse name: N/A  . Number of children: N/A  . Years of education: N/A   Occupational History  . Not on file.   Social History Main Topics  . Smoking status: Former Research scientist (life sciences)  . Smokeless tobacco: Never Used     Comment: quit 17 years ago  . Alcohol use No  . Drug use: No  . Sexual activity: Not Currently     Comment: no desire   Other Topics Concern  . Not on file   Social History Narrative  . No narrative on file     Current Outpatient Prescriptions:  .  levothyroxine (SYNTHROID, LEVOTHROID) 137 MCG tablet, Take 1 tablet (137 mcg total) by mouth daily before breakfast. Take additional 1/2 tab each Sunday., Disp: 90 tablet, Rfl: 0 .  calcipotriene (DOVONOX) 0.005 % cream, apply to affected area twice a day, Disp: , Rfl: 0 .  fluorouracil (EFUDEX) 5 % cream, apply to affected area twice a day, Disp: , Rfl: 0 .  fluticasone furoate-vilanterol (BREO ELLIPTA) 100-25 MCG/INH AEPB,  Inhale 1 puff into the lungs daily., Disp: 180 each, Rfl: 0 .  levalbuterol (XOPENEX HFA) 45 MCG/ACT inhaler, Inhale 2 puffs into the lungs every 4 (four) hours as needed for wheezing. (Patient not taking: Reported on 09/01/2016), Disp: 1 Inhaler, Rfl: 12  Allergies  Allergen Reactions  . Codeine Nausea And Vomiting     ROS  Constitutional: Negative for fever or weight change.  Respiratory: Positive for cough and shortness of breath.   Cardiovascular: Negative for chest pain or palpitations.  Gastrointestinal: Negative for abdominal pain, no bowel changes.  Musculoskeletal: Negative for gait problem or joint swelling.  Skin: Negative for rash.   Neurological: Negative for dizziness or headache.  No other specific complaints in a complete review of systems (except as listed in HPI above).  Objective  Vitals:   01/10/17 1105  BP: 112/60  Pulse: 76  Resp: 16  Temp: (!) 97.4 F (36.3 C)  TempSrc: Oral  SpO2: 95%  Weight: 152 lb 11.2 oz (69.3 kg)  Height: 5\' 1"  (1.549 m)    Body mass index is 28.85 kg/m.  Physical Exam  Constitutional: Patient appears well-developed and well-nourished. Obese  No distress.  HEENT: head atraumatic, normocephalic, pupils equal and reactive to light,, neck supple, throat within normal limits Cardiovascular: Normal rate, regular rhythm and normal heart sounds.  No murmur heard. No BLE edema. Pulmonary/Chest: Effort normal and breath sounds normal. No respiratory distress. Abdominal: Soft.  There is no tenderness. Psychiatric: Patient has a normal mood and affect. behavior is normal. Judgment and thought content normal.  PHQ2/9: Depression screen [facility] 2/9 01/10/2017 08/04/2015  Decreased Interest 0 0  Down, Depressed, Hopeless 0 0  PHQ - 2 Score 0 0     Fall Risk: Fall Risk  01/10/2017 08/04/2015  Falls in the past year? No No     Functional Status Survey: Is the patient deaf or have difficulty hearing?: No Does the patient have difficulty seeing, even when wearing glasses/contacts?: No Does the patient have difficulty concentrating, remembering, or making decisions?: No Does the patient have difficulty walking or climbing stairs?: No Does the patient have difficulty dressing or bathing?: No Does the patient have difficulty doing errands alone such as visiting a doctor's office or shopping?: No    Assessment & Plan  1. Allergic asthma, moderate persistent, with acute exacerbation  Only takes Advair once daily, we will change to breo to increase compliance, she was off medication for 2 weeks and likely the cause of the flare - fluticasone furoate-vilanterol (BREO ELLIPTA) 100-25  MCG/INH AEPB; Inhale 1 puff into the lungs daily.  Dispense: 180 each; Refill: 0  2. Acquired hypothyroidism  - TSH She has skipped medications a few days, advised to take it with Breo every morning   3. Vitamin D deficiency  Discussed vitamin D 2000 units daily   4. Need for immunization against influenza  - Flu Vaccine QUAD 6+ mos PF IM (Fluarix Quad PF)  5. Dyslipidemia  Continue life style modification

## 2017-01-12 ENCOUNTER — Telehealth: Payer: Self-pay | Admitting: Family Medicine

## 2017-01-12 ENCOUNTER — Other Ambulatory Visit: Payer: Self-pay

## 2017-01-12 ENCOUNTER — Other Ambulatory Visit: Payer: Self-pay | Admitting: Family Medicine

## 2017-01-12 MED ORDER — PREDNISONE 20 MG PO TABS: 20.0000 mg | ORAL_TABLET | Freq: Every day | ORAL | 0 refills | Status: DC

## 2017-01-12 NOTE — Telephone Encounter (Signed)
Patient was seen on 01/10/17 by Dr. Ancil Boozer. She is still experiencing coughing and now she congested.  Patient would like to know if something can be called into the pharmacy.  Patient uses the [facility] on [address].  Patient stated that a message can be left if she doesn't answer.  Please advise.

## 2017-01-12 NOTE — Telephone Encounter (Signed)
Patient notified of Prednisone being sent into her pharmacy at [facility].

## 2017-03-16 ENCOUNTER — Encounter: Payer: 59 | Admitting: Family Medicine

## 2017-04-27 ENCOUNTER — Ambulatory Visit (INDEPENDENT_AMBULATORY_CARE_PROVIDER_SITE_OTHER): Payer: Managed Care, Other (non HMO) | Admitting: Family Medicine

## 2017-04-27 ENCOUNTER — Encounter: Payer: Self-pay | Admitting: Family Medicine

## 2017-04-27 VITALS — BP 120/70 | HR 78 | Temp 97.6°F | Resp 14 | Ht 60.24 in | Wt 150.4 lb

## 2017-04-27 DIAGNOSIS — Z01411 Encounter for gynecological examination (general) (routine) with abnormal findings: Secondary | ICD-10-CM | POA: Diagnosis not present

## 2017-04-27 DIAGNOSIS — Z1231 Encounter for screening mammogram for malignant neoplasm of breast: Secondary | ICD-10-CM

## 2017-04-27 DIAGNOSIS — Z1239 Encounter for other screening for malignant neoplasm of breast: Secondary | ICD-10-CM

## 2017-04-27 DIAGNOSIS — R7303 Prediabetes: Secondary | ICD-10-CM

## 2017-04-27 DIAGNOSIS — E039 Hypothyroidism, unspecified: Secondary | ICD-10-CM

## 2017-04-27 DIAGNOSIS — Z124 Encounter for screening for malignant neoplasm of cervix: Secondary | ICD-10-CM | POA: Diagnosis not present

## 2017-04-27 DIAGNOSIS — Z01419 Encounter for gynecological examination (general) (routine) without abnormal findings: Secondary | ICD-10-CM

## 2017-04-27 LAB — POCT GLYCOSYLATED HEMOGLOBIN (HGB A1C): Hemoglobin A1C: 6

## 2017-04-27 NOTE — Progress Notes (Signed)
Name: Maria Richardson   MRN: [id number]    DOB: [birth year]   Date:04/27/2017       Progress Note  Subjective  Chief Complaint  Chief Complaint  Patient presents with  . Annual Exam    HPI   Patient presents for annual CPE and regular follow up  Well woman: not sexually in over 7 years, does not trust her husband, no post-menopausal bleeding  Borderline DM:  hgb A1C 6.4% to 6.2%  Last visit down to 5.9% today is 6.0% - she is following a diabetic diet. She denies polyphagia, polydipsia or polyuria. She states she will start exercising. [place] bars.   Hypothyroidism: we will recheck TSH, no constipation, no palpitation.   Rash legs: seeing Dr Phillip Heal, had biopsy recently   Asthma Moderate: she is back on Advair using only once a day, we changed to [facility] but she developed a rash and she is back on Advair, no sob or wheezing, she has occasional cough.   Dyslipidemia: on life style modification only , recheck labs   USPSTF grade A and B recommendations  Depression:  Depression screen [facility] 2/9 04/27/2017 01/10/2017 08/04/2015  Decreased Interest 0 0 0  Down, Depressed, Hopeless 0 0 0  PHQ - 2 Score 0 0 0   Hypertension: BP Readings from Last 3 Encounters:  04/27/17 120/70  01/10/17 112/60  09/01/16 124/70   Obesity: Wt Readings from Last 3 Encounters:  04/27/17 150 lb 6.4 oz (68.2 kg)  01/10/17 152 lb 11.2 oz (69.3 kg)  09/01/16 150 lb 8 oz (68.3 kg)   BMI Readings from Last 3 Encounters:  04/27/17 29.14 kg/m  01/10/17 28.85 kg/m  09/01/16 29.39 kg/m     HIV: today  STD testing and prevention (chl/gon/syphilis): not interested  Intimate partner violence: discussed safety  Sexual History/Pain during Intercourse: Menstrual History/LMP/Abnormal Bleeding: post-menopausal  Incontinence Symptoms:   Advanced Care Planning: A voluntary discussion about advance care planning including the explanation and discussion of advance directives.  Discussed health care proxy  and Living will, and the patient was able to identify a health care proxy as Maria Richardson ( friend) .  Patient does not have a living will at present time. If patient does have living will, I have requested they bring this to the clinic to be scanned in to their chart.  Breast cancer:  HM Mammogram  Date Value Ref Range Status  01/04/2012 Normal  Final     Cervical cancer screening: due today   Lipids:  Lab Results  Component Value Date   CHOL 164 08/04/2015   CHOL 209 (H) 01/07/2015   CHOL 216 (A) 11/14/2013   Lab Results  Component Value Date   HDL 58 08/04/2015   HDL 59 01/07/2015   HDL 66 11/14/2013   Lab Results  Component Value Date   LDLCALC 97 08/04/2015   LDLCALC 135 (H) 01/07/2015   LDLCALC 136 11/14/2013   Lab Results  Component Value Date   TRIG 47 08/04/2015   TRIG 73 01/07/2015   TRIG 72 11/14/2013   Lab Results  Component Value Date   CHOLHDL 2.8 08/04/2015   CHOLHDL 3.5 01/07/2015   No results found for: LDLDIRECT  Glucose:  Glucose  Date Value Ref Range Status  01/07/2015 92 65 - 99 mg/dL Final   Glucose, Bld  Date Value Ref Range Status  06/13/2016 130 (H) 65 - 99 mg/dL Final    Colorectal cancer: 2017 repeat in 2022 Lung cancer:  Low Dose  CT Chest recommended if Age 41-80 years, 30 pack-year currently smoking OR have quit w/in 15years. Patient does not qualify.   Aspirin: needs to start baby daily  ECG: today    Patient Active Problem List   Diagnosis Date Noted  . Dyslipidemia 08/04/2015  . Benign neoplasm of sigmoid colon   . Seasonal allergic rhinitis 01/07/2015  . Dermatitis, eczematoid 01/07/2015  . History of anemia 01/07/2015  . Excess weight 01/07/2015  . 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 Leiomyoma of uterus 01/07/2015  . Calculus of gallbladder 01/07/2015  . Acquired hypothyroidism 09/24/2009  . Asthma, moderate persistent, poorly-controlled 05/18/2009  . Vitamin D deficiency 05/18/2009    Past Surgical History:   Procedure Laterality Date  . APPENDECTOMY    . COLONOSCOPY WITH PROPOFOL N/A 07/23/2015   Procedure: COLONOSCOPY WITH PROPOFOL;  Surgeon: Lucilla Lame, MD;  Location: [place];  Service: Endoscopy;  Laterality: N/A;  PLEASE LEAVE PT AT [place]     as teenager  . POLYPECTOMY  07/23/2015   Procedure: POLYPECTOMY;  Surgeon: Lucilla Lame, MD;  Location: [place];  Service: Endoscopy;;    Family History  Problem Relation Age of Onset  . Diabetes Father   . Hypertension Father   . Kidney disease Father   . Colon cancer Father     Social History   Socioeconomic History  . Marital status: Married    Spouse name: Richard   . Number of children: 0  . Years of education: Not on file  . Highest education level: 12th grade  Social Needs  . Financial resource strain: Not hard at all  . Food insecurity - worry: Never true  . Food insecurity - inability: Never true  . Transportation needs - medical: No  . Transportation needs - non-medical: No  Occupational History  . Occupation: Hotel manager: [employer]  Tobacco Use  . Smoking status: Former Smoker    Packs/day: 0.50    Years: 30.00    Pack years: 15.00    Types: Cigarettes    Last attempt to quit: 04/28/1999    Years since quitting: 18.0  . Smokeless tobacco: Never Used  Substance and Sexual Activity  . Alcohol use: No    Alcohol/week: 0.0 oz  . Drug use: No  . Sexual activity: Not Currently    Comment: no desire  Other Topics Concern  . Not on file  Social History Narrative   Husband has a history of alcohol use and sometimes drug use and she gets scared of getting exposed to STI and sometimes she is scared when he is under the influence.    Never had children - but has step children and step grandchildren    Raised her cousin, still lives at home     Current Outpatient Medications:  .  levothyroxine (SYNTHROID, LEVOTHROID) 137 MCG tablet, Take 1 tablet (137 mcg total) by  mouth daily before breakfast. Take additional 1/2 tab each Sunday., Disp: 90 tablet, Rfl: 0 .  Cholecalciferol (VITAMIN D) 2000 units CAPS, Take 1 capsule (2,000 Units total) by mouth daily. (Patient not taking: Reported on 04/27/2017), Disp: 30 capsule, Rfl: 0  Allergies  Allergen Reactions  . Codeine Nausea And Vomiting     ROS   Constitutional: Negative for fever or weight change.  Respiratory: Negative for cough and shortness of breath.   Cardiovascular: Negative for chest pain or palpitations.  Gastrointestinal: Negative for abdominal pain, no bowel changes.  Musculoskeletal: Negative for gait problem or joint swelling.  Skin: Negative for rash.  Neurological: Negative for dizziness or headache.  No other specific complaints in a complete review of systems (except as listed in HPI above).   Objective  Vitals:   04/27/17 1035  BP: 120/70  Pulse: 78  Resp: 14  Temp: 97.6 F (36.4 C)  TempSrc: Oral  SpO2: 99%  Weight: 150 lb 6.4 oz (68.2 kg)  Height: 5' 0.24" (1.53 m)    Body mass index is 29.14 kg/m.  Physical Exam  Constitutional: Patient appears well-developed and well-nourished. No distress.  HENT: Head: Normocephalic and atraumatic. Ears: B TMs ok, no erythema or effusion; Nose: Nose normal. Mouth/Throat: Oropharynx is clear and moist. No oropharyngeal exudate.  Eyes: Conjunctivae and EOM are normal. Pupils are equal, round, and reactive to light. No scleral icterus.  Neck: Normal range of motion. Neck supple. No JVD present. No thyromegaly present.  Cardiovascular: Normal rate, regular rhythm and normal heart sounds.  No murmur heard. No BLE edema. Pulmonary/Chest: Effort normal and breath sounds normal. No respiratory distress. Abdominal: Soft. Bowel sounds are normal, no distension. There is no tenderness. no masses Breast: no lumps or masses, no nipple discharge or rashes FEMALE GENITALIA:  External genitalia normal External urethra normal Vaginal vault  normal without discharge or lesions Cervix normal without discharge or lesions Bimanual exam normal without masses RECTAL: not done Musculoskeletal: Normal range of motion, no joint effusions. No gross deformities Neurological: he is alert and oriented to person, place, and time. No cranial nerve deficit. Coordination, balance, strength, speech and gait are normal.  Skin: Skin is warm and dry. She has hyperpigmented and and raised lesions lower extremities seeing Dr. Aubery Lapping   Psychiatric: Patient has a normal mood and affect. behavior is normal. Judgment and thought content normal.   Recent Results (from the past 2160 hour(s))  POCT HgB A1C     Status: Abnormal   Collection Time: 04/27/17 10:33 AM  Result Value Ref Range   Hemoglobin A1C 6.0     PHQ2/9: Depression screen [facility] 2/9 04/27/2017 01/10/2017 08/04/2015  Decreased Interest 0 0 0  Down, Depressed, Hopeless 0 0 0  PHQ - 2 Score 0 0 0    Fall Risk: Fall Risk  04/27/2017 01/10/2017 08/04/2015  Falls in the past year? No No No    Functional Status Survey: Is the patient deaf or have difficulty hearing?: No Does the patient have difficulty seeing, even when wearing glasses/contacts?: No Does the patient have difficulty concentrating, remembering, or making decisions?: No Does the patient have difficulty walking or climbing stairs?: No Does the patient have difficulty dressing or bathing?: No Does the patient have difficulty doing errands alone such as visiting a doctor's office or shopping?: No  Assessment & Plan   1. Well woman exam  Discussed importance of 150 minutes of physical activity weekly, eat two servings of fish weekly, eat one serving of tree nuts ( cashews, pistachios, pecans, almonds.Marland Kitchen) every other day, eat 6 servings of fruit/vegetables daily and drink plenty of water and avoid sweet beverages.  - Comprehensive metabolic panel - CBC with Differential/Platelet - TSH - HIV antibody - VITAMIN D 25  Hydroxy (Vit-D Deficiency, Fractures) - Vitamin B12 - Lipid panel - Pap IG and HPV (high risk) DNA detection - MM DIGITAL SCREENING BILATERAL; Future -EKG - T wave abnormalities, but when compared with previous EKG unchanged - [facility] at [facility] in 2011  2. Borderline diabetes  -  POCT HgB A1C  3. Adult hypothyroidism  - TSH  4. Breast cancer screening  - MM DIGITAL SCREENING BILATERAL; Future  5. Cervical cancer screening  - Pap IG and HPV (high risk) DNA detection

## 2017-04-27 NOTE — Patient Instructions (Signed)
Preventive Care 40-64 Years, Female Preventive care refers to lifestyle choices and visits with your health care provider that can promote health and wellness. What does preventive care include?  A yearly physical exam. This is also called an annual well check.  Dental exams once or twice a year.  Routine eye exams. Ask your health care provider how often you should have your eyes checked.  Personal lifestyle choices, including: ? Daily care of your teeth and gums. ? Regular physical activity. ? Eating a healthy diet. ? Avoiding tobacco and drug use. ? Limiting alcohol use. ? Practicing safe sex. ? Taking low-dose aspirin daily starting at age 56. ? Taking vitamin and mineral supplements as recommended by your health care provider. What happens during an annual well check? The services and screenings done by your health care provider during your annual well check will depend on your age, overall health, lifestyle risk factors, and family history of disease. Counseling Your health care provider may ask you questions about your:  Alcohol use.  Tobacco use.  Drug use.  Emotional well-being.  Home and relationship well-being.  Sexual activity.  Eating habits.  Work and work Statistician.  Method of birth control.  Menstrual cycle.  Pregnancy history.  Screening You may have the following tests or measurements:  Height, weight, and BMI.  Blood pressure.  Lipid and cholesterol levels. These may be checked every 5 years, or more frequently if you are over 56 years old.  Skin check.  Lung cancer screening. You may have this screening every year starting at age 56 if you have a 30-pack-year history of smoking and currently smoke or have quit within the past 15 years.  Fecal occult blood test (FOBT) of the stool. You may have this test every year starting at age 56.  Flexible sigmoidoscopy or colonoscopy. You may have a sigmoidoscopy every 5 years or a colonoscopy  every 10 years starting at age 56.  Hepatitis C blood test.  Hepatitis B blood test.  Sexually transmitted disease (STD) testing.  Diabetes screening. This is done by checking your blood sugar (glucose) after you have not eaten for a while (fasting). You may have this done every 1-3 years.  Mammogram. This may be done every 1-2 years. Talk to your health care provider about when you should start having regular mammograms. This may depend on whether you have a family history of breast cancer.  BRCA-related cancer screening. This may be done if you have a family history of breast, ovarian, tubal, or peritoneal cancers.  Pelvic exam and Pap test. This may be done every 3 years starting at age 56. Starting at age 36, this may be done every 5 years if you have a Pap test in combination with an HPV test.  Bone density scan. This is done to screen for osteoporosis. You may have this scan if you are at high risk for osteoporosis.  Discuss your test results, treatment options, and if necessary, the need for more tests with your health care provider. Vaccines Your health care provider may recommend certain vaccines, such as:  Influenza vaccine. This is recommended every year.  Tetanus, diphtheria, and acellular pertussis (Tdap, Td) vaccine. You may need a Td booster every 10 years.  Varicella vaccine. You may need this if you have not been vaccinated.  Zoster vaccine. You may need this after age 5.  Measles, mumps, and rubella (MMR) vaccine. You may need at least one dose of MMR if you were born in  1956 or later. You may also need a second dose.  Pneumococcal 13-valent conjugate (PCV13) vaccine. You may need this if you have certain conditions and were not previously vaccinated.  Pneumococcal polysaccharide (PPSV23) vaccine. You may need one or two doses if you smoke cigarettes or if you have certain conditions.  Meningococcal vaccine. You may need this if you have certain  conditions.  Hepatitis A vaccine. You may need this if you have certain conditions or if you travel or work in places where you may be exposed to hepatitis A.  Hepatitis B vaccine. You may need this if you have certain conditions or if you travel or work in places where you may be exposed to hepatitis B.  Haemophilus influenzae type b (Hib) vaccine. You may need this if you have certain conditions.  Talk to your health care provider about which screenings and vaccines you need and how often you need them. This information is not intended to replace advice given to you by your health care provider. Make sure you discuss any questions you have with your health care provider. Document Released: 04/16/2015 Document Revised: 12/08/2015 Document Reviewed: 01/19/2015 Elsevier Interactive Patient Education  2018 Elsevier Inc.  

## 2017-04-28 LAB — CBC WITH DIFFERENTIAL/PLATELET
BASOS ABS: 0.1 10*3/uL (ref 0.0–0.2)
Basos: 1 %
EOS (ABSOLUTE): 0.4 10*3/uL (ref 0.0–0.4)
Eos: 6 %
Hematocrit: 40.5 % (ref 34.0–46.6)
Hemoglobin: 13 g/dL (ref 11.1–15.9)
Immature Grans (Abs): 0 10*3/uL (ref 0.0–0.1)
Immature Granulocytes: 0 %
LYMPHS ABS: 1.9 10*3/uL (ref 0.7–3.1)
Lymphs: 25 %
MCH: 29 pg (ref 26.6–33.0)
MCHC: 32.1 g/dL (ref 31.5–35.7)
MCV: 90 fL (ref 79–97)
Monocytes Absolute: 0.6 10*3/uL (ref 0.1–0.9)
Monocytes: 7 %
NEUTROS ABS: 4.8 10*3/uL (ref 1.4–7.0)
Neutrophils: 61 %
PLATELETS: 288 10*3/uL (ref 150–379)
RBC: 4.49 x10E6/uL (ref 3.77–5.28)
RDW: 15.1 % (ref 12.3–15.4)
WBC: 7.8 10*3/uL (ref 3.4–10.8)

## 2017-04-28 LAB — LIPID PANEL
CHOL/HDL RATIO: 3.1 ratio (ref 0.0–4.4)
Cholesterol, Total: 176 mg/dL (ref 100–199)
HDL: 56 mg/dL (ref >39)
LDL Calculated: 107 mg/dL — ABNORMAL HIGH (ref 0–99)
Triglycerides: 64 mg/dL (ref 0–149)
VLDL CHOLESTEROL CAL: 13 mg/dL (ref 5–40)

## 2017-04-28 LAB — COMPREHENSIVE METABOLIC PANEL
A/G RATIO: 1.3 (ref 1.2–2.2)
ALBUMIN: 4.1 g/dL (ref 3.5–5.5)
ALK PHOS: 89 IU/L (ref 39–117)
ALT: 27 IU/L (ref 0–32)
AST: 28 IU/L (ref 0–40)
BILIRUBIN TOTAL: 0.3 mg/dL (ref 0.0–1.2)
BUN / CREAT RATIO: 13 (ref 9–23)
BUN: 13 mg/dL (ref 6–24)
CHLORIDE: 105 mmol/L (ref 96–106)
CO2: 24 mmol/L (ref 20–29)
Calcium: 9.3 mg/dL (ref 8.7–10.2)
Creatinine, Ser: 0.99 mg/dL (ref 0.57–1.00)
GFR calc non Af Amer: 64 mL/min/{1.73_m2} (ref >59)
GFR, EST AFRICAN AMERICAN: 74 mL/min/{1.73_m2} (ref >59)
GLUCOSE: 103 mg/dL — AB (ref 65–99)
Globulin, Total: 3.2 g/dL (ref 1.5–4.5)
POTASSIUM: 4.3 mmol/L (ref 3.5–5.2)
Sodium: 145 mmol/L — ABNORMAL HIGH (ref 134–144)
TOTAL PROTEIN: 7.3 g/dL (ref 6.0–8.5)

## 2017-04-28 LAB — VITAMIN B12: Vitamin B-12: 402 pg/mL (ref 232–1245)

## 2017-04-28 LAB — VITAMIN D 25 HYDROXY (VIT D DEFICIENCY, FRACTURES): Vit D, 25-Hydroxy: 18.9 ng/mL — ABNORMAL LOW (ref 30.0–100.0)

## 2017-04-28 LAB — HIV ANTIBODY (ROUTINE TESTING W REFLEX): HIV Screen 4th Generation wRfx: NONREACTIVE

## 2017-04-28 LAB — TSH: TSH: 7.01 u[IU]/mL — AB (ref 0.450–4.500)

## 2017-04-29 ENCOUNTER — Other Ambulatory Visit: Payer: Self-pay | Admitting: Family Medicine

## 2017-04-29 MED ORDER — VITAMIN D (ERGOCALCIFEROL) 1.25 MG (50000 UNIT) PO CAPS: 50000.0000 [IU] | ORAL_CAPSULE | ORAL | 0 refills | Status: DC

## 2017-04-30 ENCOUNTER — Telehealth: Payer: Self-pay | Admitting: Family Medicine

## 2017-04-30 NOTE — Telephone Encounter (Signed)
Patient is calling to review her lab results- all labs reviewed. Patient is not sure about her dosing of her levothyroxine- so she is going to check and call back with verified dose. ( She is taking 1 daily)

## 2017-05-01 ENCOUNTER — Other Ambulatory Visit: Payer: Self-pay | Admitting: Family Medicine

## 2017-05-01 ENCOUNTER — Other Ambulatory Visit: Payer: Self-pay

## 2017-05-01 ENCOUNTER — Telehealth: Payer: Self-pay | Admitting: Family Medicine

## 2017-05-01 DIAGNOSIS — E039 Hypothyroidism, unspecified: Secondary | ICD-10-CM

## 2017-05-01 LAB — PAP IG AND HPV HIGH-RISK
HPV, HIGH-RISK: NEGATIVE
PAP Smear Comment: 0

## 2017-05-01 MED ORDER — VITAMIN D (ERGOCALCIFEROL) 1.25 MG (50000 UNIT) PO CAPS: 50000.0000 [IU] | ORAL_CAPSULE | ORAL | 0 refills | Status: DC

## 2017-05-01 NOTE — Telephone Encounter (Signed)
Left a message to remind patient to take Levothyroxine daily and a extra half of one on Sundays. Recheck TSH on June 12, 2017 and lab slip will be upfront.

## 2017-05-01 NOTE — Telephone Encounter (Signed)
Copied from [facility]. Topic: Quick Communication - Rx Refill/Question >> May 01, 2017  3:01 PM Cecelia Byars, NT wrote: Medication:vitamin d 50000 units Has the patient contacted their pharmacy? {yes  (Agent: If no, request that the patient contact the pharmacy for the refill.) Preferred Pharmacy (with phone number or street name): [facility] on [address] , [phone] Agent: Please be advised that RX refills may take up to 3 business days. We ask that you follow-up with your pharmacy.  Prescription was sent on 04/29/17 to [facility] of [facility]

## 2017-05-01 NOTE — Telephone Encounter (Signed)
vitamin d 50000 units  Preferred Pharmacy (with phone number or street name): [facility] on [address] , [phone]  Please resend to [place], sent to [facility] on error.

## 2017-05-01 NOTE — Telephone Encounter (Signed)
Copied from [place]. Topic: General - Other >> May 01, 2017  3:05 PM Maria Richardson, NT wrote: Reason for CRM: Patient called  she is  taking levothyroxine 137 mg

## 2017-05-01 NOTE — Telephone Encounter (Signed)
She needs to take an extra half pill on Sundays and recheck in 6 weeks.

## 2017-05-11 ENCOUNTER — Telehealth: Payer: Self-pay

## 2017-05-11 NOTE — Telephone Encounter (Signed)
Patient has been taking Levothyroxine as prescribed for 2 days and she reports that she does not feel well taking this medication like this. She has been having acid reflux and difficulty breathing and chest tightness.  She has discontinued medication and has not taking it in 5 days. Please advise.

## 2017-05-15 NOTE — Telephone Encounter (Signed)
Tell her to try half daily, and gradually go up to one every other day for a couple of weeks until she can tolerate taking one daily, she must take medication daily to get TSH to go , but she can try to go up very slowly on the dose

## 2017-05-16 NOTE — Telephone Encounter (Signed)
Tried to call and inform patient of medication change. No answer sent a CRM if the patient calls back please inform her per Dr. Ancil Boozer of the following.   Tell her to try half daily, and gradually go up to one every other day for a couple of weeks until she can tolerate taking one daily, she must take medication daily to get TSH to go , but she can try to go up very slowly on the dose

## 2017-05-30 ENCOUNTER — Encounter: Payer: Self-pay | Admitting: Family Medicine

## 2017-05-30 NOTE — Telephone Encounter (Signed)
Patient returned call, informed of note from Dr. Ancil Boozer on 05/15/17, she said "I just started taking 1 pill daily last week on Monday and I am fine with taking it now. I will go ahead and follow her instructions she gave me to take 1 pill daily and 1-1/2 pills on Sunday."

## 2017-05-30 NOTE — Telephone Encounter (Signed)
This encounter was created in error - please disregard.

## 2017-06-28 ENCOUNTER — Telehealth: Payer: Self-pay | Admitting: Family Medicine

## 2017-06-28 NOTE — Telephone Encounter (Signed)
Copied from [facility] [id number]. Topic: Quick Communication - Rx Refill/Question >> Jun 28, 2017  3:50 PM Scherrie Gerlach wrote: Medication: ADVAIR DISKUS 250-50 MCG/DOSE AEPB  Has the patient contacted their pharmacy? Yes but pt states pharmacy has never filled [id number] - [address], [place] [phone] (Phone) [phone] (Fax)

## 2017-07-02 ENCOUNTER — Other Ambulatory Visit: Payer: Self-pay | Admitting: Family Medicine

## 2017-07-02 DIAGNOSIS — E039 Hypothyroidism, unspecified: Secondary | ICD-10-CM

## 2017-07-02 MED ORDER — LEVOTHYROXINE SODIUM 137 MCG PO TABS: 137.0000 ug | ORAL_TABLET | Freq: Every day | ORAL | 0 refills | Status: DC

## 2017-07-02 MED ORDER — FLUTICASONE-SALMETEROL 250-50 MCG/DOSE IN AEPB: 1.0000 | INHALATION_SPRAY | Freq: Two times a day (BID) | RESPIRATORY_TRACT | 2 refills | Status: DC

## 2017-07-02 NOTE — Telephone Encounter (Signed)
She needs to come in for repeat TSH. I will send Advair today, I will send 7 days of synthroid

## 2017-07-02 NOTE — Telephone Encounter (Signed)
Pt calling to check status on the refill of her advair. Also requesting refill of levothyroxine (SYNTHROID, LEVOTHROID) sent to [facility] on [address] in [place].

## 2017-07-04 NOTE — Telephone Encounter (Signed)
Copied from [place] [id number]. Topic: Quick Communication - Office Called Patient >> Jul 04, 2017  8:45 AM Synthia Innocent wrote: Reason for CRM: office called patient

## 2017-07-18 ENCOUNTER — Encounter: Payer: Self-pay | Admitting: Nurse Practitioner

## 2017-07-18 ENCOUNTER — Ambulatory Visit (INDEPENDENT_AMBULATORY_CARE_PROVIDER_SITE_OTHER): Payer: Managed Care, Other (non HMO) | Admitting: Nurse Practitioner

## 2017-07-18 VITALS — BP 126/72 | HR 77 | Temp 97.5°F | Resp 16 | Ht 60.0 in | Wt 153.8 lb

## 2017-07-18 DIAGNOSIS — E039 Hypothyroidism, unspecified: Secondary | ICD-10-CM

## 2017-07-18 DIAGNOSIS — J4540 Moderate persistent asthma, uncomplicated: Secondary | ICD-10-CM

## 2017-07-18 DIAGNOSIS — R059 Cough, unspecified: Secondary | ICD-10-CM

## 2017-07-18 DIAGNOSIS — R05 Cough: Secondary | ICD-10-CM

## 2017-07-18 MED ORDER — FLUTICASONE PROPIONATE (INHAL) 100 MCG/BLIST IN AEPB: 2.0000 | INHALATION_SPRAY | Freq: Two times a day (BID) | RESPIRATORY_TRACT | 3 refills | Status: DC

## 2017-07-18 MED ORDER — LEVOTHYROXINE SODIUM 137 MCG PO TABS: 137.0000 ug | ORAL_TABLET | Freq: Every day | ORAL | 0 refills | Status: DC

## 2017-07-18 MED ORDER — ALBUTEROL SULFATE HFA 108 (90 BASE) MCG/ACT IN AERS
2.0000 | INHALATION_SPRAY | Freq: Four times a day (QID) | RESPIRATORY_TRACT | 2 refills | Status: DC | PRN
Start: 2017-07-18 — End: 2017-09-14

## 2017-07-18 NOTE — Patient Instructions (Addendum)
For your thyroid: - Continue taking 137mg  of levothyroxine every day with an extra half dose on sundays.  - come in on or around 08/28/2017 for a lab only visit to redraw your TSH and then we can prescribe more based off of your results  For your asthma - We are switching your inhaler to flovent- it does not have the long acting beta agonist that causes palpitations in it - I am giving you a rescue inhaler as well; just in case; this does cause palpations but please use when needed.    For cough: - Please use inhalers as prescribed - Avoid allergy triggers (pollen, dust, grass) - Take an OTC antihistamine such as Xyzal, or loratadine (claratin) every day for one month- and see how it relieves your symptoms

## 2017-07-18 NOTE — Progress Notes (Addendum)
Name: Maria Richardson   MRN: [id number]    DOB: [birth year]   Date:07/18/2017       Progress Note  Subjective  Chief Complaint  Chief Complaint  Patient presents with  . Medication Refill    HPI     Asthma Patient takes advair once daily. Patient states exercise induces asthma symptoms of wheezing. States she can only take advair once daily because it makes her heart flutter- states rescue inhalers due the same and give her palpitations and increased anxiety. No longer smoker over 18 years ago. Patient endorse dry hacking cough intermittently. Denies sore throat, nasal congestion, fevers, chills, myalgias.    Hypothyroidism Last seen in office for well-women visit on 04/27/2017- TSH then was 7.010. She was taking levothyroxine 137mg  daily and increased to taking an extra half a pill on sundays. Pt did come in for TSH recheck in 6 weeks but will get re-check today. Patient states was doing it as prescribed but has been out of medication for a week.  Denies fatigue, cold sensitivity,  constipation, dry skin, and unexplained weight gain.    Patient Active Problem List   Diagnosis Date Noted  . Dyslipidemia 08/04/2015  . Benign neoplasm of sigmoid colon   . Seasonal allergic rhinitis 01/07/2015  . Dermatitis, eczematoid 01/07/2015  . History of anemia 01/07/2015  . Excess weight 01/07/2015  . 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 Leiomyoma of uterus 01/07/2015  . Calculus of gallbladder 01/07/2015  . Acquired hypothyroidism 09/24/2009  . Asthma, moderate persistent, poorly-controlled 05/18/2009  . Vitamin D deficiency 05/18/2009    Past Medical History:  Diagnosis Date  . Asthma   . Eczema   . Hypoglycemia   . Hypothyroidism   . Shortness of breath dyspnea    because of asthma  . Wears dentures    full lower    Past Surgical History:  Procedure Laterality Date  . APPENDECTOMY    . COLONOSCOPY WITH PROPOFOL N/A 07/23/2015   Procedure: COLONOSCOPY WITH PROPOFOL;  Surgeon:  Lucilla Lame, MD;  Location: [facility];  Service: Endoscopy;  Laterality: N/A;  PLEASE LEAVE PT AT [place]     as teenager  . POLYPECTOMY  07/23/2015   Procedure: POLYPECTOMY;  Surgeon: Lucilla Lame, MD;  Location: [facility];  Service: Endoscopy;;    Social History   Tobacco Use  . Smoking status: Former Smoker    Packs/day: 0.50    Years: 30.00    Pack years: 15.00    Types: Cigarettes    Last attempt to quit: 04/28/1999    Years since quitting: 18.2  . Smokeless tobacco: Never Used  Substance Use Topics  . Alcohol use: No    Alcohol/week: 0.0 oz     Current Outpatient Medications:  .  Cholecalciferol (VITAMIN D) 2000 units CAPS, Take 1 capsule (2,000 Units total) by mouth daily., Disp: 30 capsule, Rfl: 0 .  Fluticasone-Salmeterol (ADVAIR DISKUS) 250-50 MCG/DOSE AEPB, Inhale 1 puff into the lungs 2 (two) times daily., Disp: 60 each, Rfl: 2 .  levothyroxine (SYNTHROID, LEVOTHROID) 137 MCG tablet, Take 1 tablet (137 mcg total) by mouth daily before breakfast. Take additional 1/2 tab each Sunday., Disp: 7 tablet, Rfl: 0 .  Vitamin D, Ergocalciferol, (DRISDOL) 50000 units CAPS capsule, Take 1 capsule (50,000 Units total) by mouth every 7 (seven) days., Disp: 12 capsule, Rfl: 0  Allergies  Allergen Reactions  . Codeine Nausea And Vomiting    ROS  No other specific  complaints in a complete review of systems (except as listed in HPI above).  Objective  Vitals:   07/18/17 0834  BP: 126/72  Pulse: 77  Resp: 16  Temp: (!) 97.5 F (36.4 C)  TempSrc: Oral  SpO2: 93%  Weight: 153 lb 12.8 oz (69.8 kg)  Height: 5' (1.524 m)     Body mass index is 30.04 kg/m.  Nursing Note and Vital Signs reviewed.  Physical Exam   Constitutional: Patient appears well-developed and well-nourished.  No distress.  HEENT: head atraumatic, normocephalic, pupils equal and reactive to light, no maxillary or frontal sinus tenderness , neck supple without  lymphadenopathy, oropharynx pink and moist without exudate, no nasal discharge Cardiovascular: Normal rate, regular rhythm, S1/S2 present.  No murmur or rub heard.  Pulmonary/Chest: Effort normal and breath sounds clear. No respiratory distress or retractions. Abdominal: Soft and non-tender, bowel sounds present  Psychiatric: Patient has a normal mood and affect. behavior is normal. Judgment and thought content normal.  No results found for this or any previous visit (from the past 72 hour(s)).  Assessment & Plan  Patient presents today for routine follow-up, needed refill of her levothyroxine.  At appointment in January was noted TSH was elevated her dose was increased in 6-week recheck of TSH was placed.  Patient did not get 6-week recheck and has been out of her levothyroxine for 1 week.  Levothyroxine reordered and patient is to take it as last prescribed 137 mcg daily with an extra half tab on Sundays.  Patient will return on May 29 for recheck of TSH.  Patient given TSH order placed by Dr. Ancil Boozer to be completed by [facility]. patient also endorses dry hacking cough intermittently, takes Advair once daily states has exertional wheezing and has to take things very slow.  States she is unable to take Advair twice daily due to palpitations and increased anxiety.  Patient switched to ICS only to prevent palpitations, and hopes to increase control of asthma.  Patient also encouraged to take once daily nondrowsy antihistamine due to allergies worsening asthma during the season.  1. Asthma, moderate persistent, poorly-controlled - Fluticasone Propionate, Inhal, 100 MCG/BLIST AEPB; Inhale 2 puffs into the lungs 2 (two) times daily.  Dispense: 60 each; Refill: 3 - albuterol (PROVENTIL HFA;VENTOLIN HFA) 108 (90 Base) MCG/ACT inhaler; Inhale 2 puffs into the lungs every 6 (six) hours as needed for wheezing or shortness of breath.  Dispense: 1 Inhaler; Refill: 2  2. Acquired hypothyroidism - levothyroxine  (SYNTHROID, LEVOTHROID) 137 MCG tablet; Take 1 tablet (137 mcg total) by mouth daily before breakfast. Take additional 1/2 tab each Sunday.  Dispense: 54 tablet; Refill: 0   -Red flags and when to present for emergency care or RTC including fever >101.28F, chest pain, shortness of breath, new/worsening/un-resolving symptoms,  reviewed with patient at time of visit. Follow up and care instructions discussed and provided in AVS.  ----------------------------------------- I have reviewed this encounter including the documentation in this note and/or discussed this patient with the provider, Suezanne Cheshire DNP AGNP-C. I am certifying that I agree with the content of this note as supervising physician. Enid Derry, [facility] 07/18/2017, 7:15 PM

## 2017-07-26 ENCOUNTER — Other Ambulatory Visit: Payer: Self-pay | Admitting: Family Medicine

## 2017-07-26 NOTE — Telephone Encounter (Signed)
Copied from [place] [id number]. Topic: Quick Communication - Rx Refill/Question >> Jul 26, 2017 10:07 AM Scherrie Gerlach wrote: Medication: fluorouracil (EFUDEX) 5 % cream for her eczema (pt has breakout on neck)  Has the patient contacted their pharmacy? No  pt has not had in a while, could not find the Rx [facility] [place], [place] [phone] (Phone) [phone] (Fax)

## 2017-07-27 MED ORDER — FLUOROURACIL 5 % EX CREA: TOPICAL_CREAM | Freq: Two times a day (BID) | CUTANEOUS | 0 refills | Status: DC

## 2017-08-02 MED ORDER — TRIAMCINOLONE ACETONIDE 0.025 % EX OINT: 1.0000 "application " | TOPICAL_OINTMENT | Freq: Two times a day (BID) | CUTANEOUS | 0 refills | Status: DC

## 2017-08-02 NOTE — Telephone Encounter (Signed)
Copied from [place] [id number]. Topic: Quick Communication - Rx Refill/Question >> Aug 01, 2017 10:01 AM Boyd Kerbs wrote: Pt. Called said the cream called in was wrong cream,  fluorouracil (EFUDEX) 5 % cream  Is for her leg.     She is needing Triamcinolone Acetonide, this is what she needs for her eczema. Asking if could call this prescription in.

## 2017-09-10 ENCOUNTER — Telehealth: Payer: Self-pay

## 2017-09-10 NOTE — Telephone Encounter (Signed)
Generic Advair was too expensive with her insurance and only could fill one of her inhalers from [place] visit on 07/18/17. Cough has not resolved at all and would like to know what to do. Is a annoying cough that won't go away-coughing up yellow mucus. Please advise.  Patient would like also to recheck her A1C and Vitamin D and send it to [phone].

## 2017-09-10 NOTE — Telephone Encounter (Signed)
She needs to come in to see me. Did she find out what is covered by her insurance?

## 2017-09-10 NOTE — Telephone Encounter (Signed)
Copied from [place] [phone]. Topic: Inquiry >> Sep 07, 2017 12:10 PM [name] wrote: Reason for CRM: pt called to speak w/ a nurse of Dr. Ancil Boozer directly; pt told to that she was given a direct number but doesn't have it to get in touch w/ whomever she spoke w/ initially, pt did not disclose what she wanted to speak about

## 2017-09-11 NOTE — Telephone Encounter (Signed)
Spoke with patient and scheduled appt for 10-09-17

## 2017-09-14 ENCOUNTER — Encounter: Payer: Self-pay | Admitting: Family Medicine

## 2017-09-14 ENCOUNTER — Ambulatory Visit (INDEPENDENT_AMBULATORY_CARE_PROVIDER_SITE_OTHER): Payer: Managed Care, Other (non HMO) | Admitting: Family Medicine

## 2017-09-14 ENCOUNTER — Ambulatory Visit
Admission: RE | Admit: 2017-09-14 | Discharge: 2017-09-14 | Disposition: A | Discharge status: Home / Self Care | Payer: Managed Care, Other (non HMO) | Source: Ambulatory Visit | Attending: Family Medicine | Admitting: Family Medicine

## 2017-09-14 VITALS — BP 110/80 | HR 79 | Temp 97.6°F | Resp 16 | Ht 60.0 in | Wt 155.7 lb

## 2017-09-14 DIAGNOSIS — E039 Hypothyroidism, unspecified: Secondary | ICD-10-CM | POA: Diagnosis not present

## 2017-09-14 DIAGNOSIS — R05 Cough: Secondary | ICD-10-CM

## 2017-09-14 DIAGNOSIS — J9811 Atelectasis: Secondary | ICD-10-CM | POA: Diagnosis not present

## 2017-09-14 DIAGNOSIS — E785 Hyperlipidemia, unspecified: Secondary | ICD-10-CM

## 2017-09-14 DIAGNOSIS — J449 Chronic obstructive pulmonary disease, unspecified: Secondary | ICD-10-CM

## 2017-09-14 DIAGNOSIS — R7303 Prediabetes: Secondary | ICD-10-CM

## 2017-09-14 DIAGNOSIS — E559 Vitamin D deficiency, unspecified: Secondary | ICD-10-CM | POA: Diagnosis not present

## 2017-09-14 DIAGNOSIS — R059 Cough, unspecified: Secondary | ICD-10-CM

## 2017-09-14 MED ORDER — LEVOTHYROXINE SODIUM 137 MCG PO TABS: 137.0000 ug | ORAL_TABLET | Freq: Every day | ORAL | 0 refills | Status: DC

## 2017-09-14 MED ORDER — LORATADINE 10 MG PO TABS: 10.0000 mg | ORAL_TABLET | Freq: Every day | ORAL | 0 refills | Status: DC

## 2017-09-14 MED ORDER — FLUTICASONE-UMECLIDIN-VILANT 100-62.5-25 MCG/INH IN AEPB: 1.0000 | INHALATION_SPRAY | Freq: Every day | RESPIRATORY_TRACT | 3 refills | Status: DC

## 2017-09-14 MED ORDER — VITAMIN D (ERGOCALCIFEROL) 1.25 MG (50000 UNIT) PO CAPS: 50000.0000 [IU] | ORAL_CAPSULE | ORAL | 0 refills | Status: DC

## 2017-09-14 MED ORDER — ALBUTEROL SULFATE HFA 108 (90 BASE) MCG/ACT IN AERS: 2.0000 | INHALATION_SPRAY | Freq: Four times a day (QID) | RESPIRATORY_TRACT | 0 refills | Status: DC | PRN
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
## 2017-09-15 LAB — HEMOGLOBIN A1C
Est. average glucose Bld gHb Est-mCnc: 134 mg/dL
HEMOGLOBIN A1C: 6.3 % — AB (ref 4.8–5.6)

## 2017-09-15 LAB — TSH: TSH: 3.95 u[IU]/mL (ref 0.450–4.500)

## 2017-09-19 ENCOUNTER — Encounter: Payer: Self-pay | Admitting: Family Medicine

## 2017-09-20 ENCOUNTER — Encounter: Payer: Self-pay | Admitting: Family Medicine

## 2017-10-03 ENCOUNTER — Telehealth: Payer: Self-pay | Admitting: Emergency Medicine

## 2017-10-03 DIAGNOSIS — J449 Chronic obstructive pulmonary disease, unspecified: Secondary | ICD-10-CM

## 2017-10-03 NOTE — Telephone Encounter (Signed)
Spoke to patient regarding labs and CXR. Patient stated that the Trelegy is $190.oo and she cannot afford. Gave her sample to last until Monday when you return to office. Patient stated that she would like to have a referral to Pulmonologist for persistent cough.

## 2017-10-05 NOTE — Telephone Encounter (Signed)
She should pay $ 25 with voucher. I will place referral to pulmonologist as requested

## 2017-10-05 NOTE — Telephone Encounter (Signed)
Patient notified

## 2017-10-09 ENCOUNTER — Ambulatory Visit: Payer: Managed Care, Other (non HMO) | Admitting: Family Medicine

## 2017-10-12 ENCOUNTER — Telehealth: Payer: Self-pay

## 2017-10-12 NOTE — Telephone Encounter (Signed)
Called patient and it went to her voicemail. I left her a message to return my call since she did not previous state what it was regarding earlier.

## 2017-10-12 NOTE — Telephone Encounter (Signed)
Copied from [place] [id number]. Topic: Inquiry >> Oct 11, 2017  3:06 PM Pricilla Handler wrote: Reason for CRM: Patient called wanting to speak with Dr. Ancil Boozer' assistant. Patient did not want to speak with anyone else. Please call patient today.       Thank You!!!

## 2017-10-15 ENCOUNTER — Other Ambulatory Visit: Payer: Self-pay | Admitting: Family Medicine

## 2017-10-15 DIAGNOSIS — E039 Hypothyroidism, unspecified: Secondary | ICD-10-CM

## 2017-10-15 MED ORDER — LEVOTHYROXINE SODIUM 137 MCG PO TABS
137.0000 ug | ORAL_TABLET | Freq: Every day | ORAL | 0 refills | Status: DC
Start: 2017-10-15 — End: 2017-10-15

## 2017-10-15 NOTE — Telephone Encounter (Signed)
Refill request was sent to Dr. Krichna Sowles for approval and submission.  

## 2017-10-15 NOTE — Telephone Encounter (Signed)
Copied from [place] [id number]. Topic: Quick Communication - Rx Refill/Question >> Oct 15, 2017  1:47 PM Marin Olp L wrote: Medication: levothyroxine (SYNTHROID, LEVOTHROID) 137 MCG tablet  Has the patient contacted their pharmacy? No. (Agent: If no, request that the patient contact the pharmacy for the refill.) (Agent: If yes, when and what did the pharmacy advise?)  Preferred Pharmacy (with phone number or street name): [id number] - [place], [place] [facility] [place] [address] [place] [address] Phone: [phone] Fax: [phone]  Agent: Please be advised that RX refills may take up to 3 business days. We ask that you follow-up with your pharmacy.  Patient did not contact pharamacy because there are no refills. Advised her she needs to contact them but she insisted I send this message.

## 2017-10-19 ENCOUNTER — Telehealth: Payer: Self-pay

## 2017-10-19 NOTE — Telephone Encounter (Signed)
Sent to pharmacy on 10/15/2017, correct dose , previous rx said one daily and an extra half on Sundays , continue current dose

## 2017-10-19 NOTE — Telephone Encounter (Signed)
Copied from [place] [id number]. Topic: General - Other >> Oct 19, 2017  9:39 AM Yvette Rack wrote: Reason for CRM: pt calling stating that she wanted me to let the provider know her her correct dosage for the levothyroxine (SYNTHROID, LEVOTHROID) she states that its 0.137mg   she says that she need for it to go to the pharmacy today that's all they was waiting on the pharmacy [id number] [place] [address], [place] - [place] [phone] (Phone)  [phone] (Fax)  Please call pt job

## 2017-10-26 ENCOUNTER — Telehealth: Payer: Self-pay | Admitting: Family Medicine

## 2017-10-26 DIAGNOSIS — Z1239 Encounter for other screening for malignant neoplasm of breast: Secondary | ICD-10-CM

## 2017-10-26 DIAGNOSIS — Z01419 Encounter for gynecological examination (general) (routine) without abnormal findings: Secondary | ICD-10-CM

## 2017-10-26 MED ORDER — FLUTICASONE FUROATE-VILANTEROL 100-25 MCG/INH IN AEPB: 1.0000 | INHALATION_SPRAY | Freq: Every day | RESPIRATORY_TRACT | 2 refills | Status: DC

## 2017-10-26 MED ORDER — TIOTROPIUM BROMIDE MONOHYDRATE 18 MCG IN CAPS: 18.0000 ug | ORAL_CAPSULE | Freq: Every day | RESPIRATORY_TRACT | 2 refills | Status: DC

## 2017-10-26 NOTE — Telephone Encounter (Signed)
Advair, Anoro Ellipta, Breo Ellipta, Stiolto Respimat, Symbicort   Trelegy is not preferred but the list above are her insurance preferred drug list. Please change due to cost. Also advised the patient to pick up the coupon card. She did come by today to pick up a sample of Trelegy and will be ok for a short term due to the sample.

## 2017-10-26 NOTE — Telephone Encounter (Signed)
Message is no clear. Is trelegy too expensive or is that what she wants?

## 2017-10-26 NOTE — Telephone Encounter (Signed)
Copied from [place] [phone]. Topic: Quick Communication - See Telephone Encounter >> Oct 26, 2017  9:07 AM Nils Flack wrote: CRM for notification. See Telephone encounter for: 10/26/17. Spoke to pt about referral, she is also asking about asthma medication she received a sample of.  She says that it is 190 per month and needs something cheaper.  She is asking if trelegy is something that can be prescribed for her.  Please cal back at [phone]

## 2017-10-26 NOTE — Telephone Encounter (Signed)
I will change to [facility]. Do they pay for Spiriva. She needs combo

## 2017-11-14 ENCOUNTER — Telehealth: Payer: Self-pay

## 2017-11-14 NOTE — Telephone Encounter (Signed)
Copied from [place] [phone]. Topic: General - Call Back - No Documentation >> Nov 14, 2017  4:10 PM Reyne Dumas L wrote: Reason for CRM:   Pt states someone just called from the office to her cell phone but she can't retrieve her cell phone messages.  Pt would like a call back at [phone]  I am not sure who called her or why but it was neither me nor Tiffany.

## 2017-11-16 ENCOUNTER — Telehealth: Payer: Self-pay | Admitting: Family Medicine

## 2017-11-16 NOTE — Telephone Encounter (Signed)
Please advise 

## 2017-11-16 NOTE — Telephone Encounter (Signed)
Pt was prescribed Spiriva and after 3 days of taking it she stated breaking out. Would like to know if she should stop taking it. Stated that you had offered her to take something else that was less expensive but she did not pick it up. Please advise. [facility] to [facility]

## 2017-11-16 NOTE — Telephone Encounter (Signed)
Her last visit was 2 months ago. Did she just start Spiriva? Please clarify

## 2017-11-19 NOTE — Telephone Encounter (Signed)
I called this patient to get clarity of when she actually started the Spiriva, but there was no answer. A message was left for her to give [place] a call back when she got the chance.

## 2017-11-23 NOTE — Telephone Encounter (Signed)
Patient stated the other medication she was on cost 190$ she got two samples from the office. Spiriva is one of those samples she stated she took that medication and it has broken her out.   She is needing a  Call back on her work number. [phone]

## 2017-11-23 NOTE — Telephone Encounter (Signed)
Stop Spiriva, we don't have any samples of Breo. Did she use a voucher? What is preferred on her formulary

## 2017-11-23 NOTE — Telephone Encounter (Signed)
Please advise 

## 2017-11-26 NOTE — Telephone Encounter (Signed)
I tried to contact this patient to get Dr. Ancil Boozer questions answered but there was no answer. A message was left for her to give [place] a call when she got the chance.

## 2017-12-18 ENCOUNTER — Ambulatory Visit: Payer: Managed Care, Other (non HMO) | Admitting: Family Medicine

## 2017-12-21 ENCOUNTER — Ambulatory Visit (INDEPENDENT_AMBULATORY_CARE_PROVIDER_SITE_OTHER): Payer: Managed Care, Other (non HMO) | Admitting: Family Medicine

## 2017-12-21 ENCOUNTER — Encounter: Payer: Self-pay | Admitting: Family Medicine

## 2017-12-21 VITALS — BP 132/76 | HR 85 | Temp 97.7°F | Resp 14 | Ht 60.0 in | Wt 159.9 lb

## 2017-12-21 DIAGNOSIS — Z23 Encounter for immunization: Secondary | ICD-10-CM | POA: Diagnosis not present

## 2017-12-21 DIAGNOSIS — E039 Hypothyroidism, unspecified: Secondary | ICD-10-CM | POA: Diagnosis not present

## 2017-12-21 DIAGNOSIS — E559 Vitamin D deficiency, unspecified: Secondary | ICD-10-CM

## 2017-12-21 DIAGNOSIS — E669 Obesity, unspecified: Secondary | ICD-10-CM

## 2017-12-21 DIAGNOSIS — R7303 Prediabetes: Secondary | ICD-10-CM

## 2017-12-21 DIAGNOSIS — J449 Chronic obstructive pulmonary disease, unspecified: Secondary | ICD-10-CM

## 2017-12-21 MED ORDER — VITAMIN D (ERGOCALCIFEROL) 1.25 MG (50000 UNIT) PO CAPS: 50000.0000 [IU] | ORAL_CAPSULE | ORAL | 0 refills | Status: DC

## 2017-12-21 MED ORDER — FLUTICASONE FUROATE-VILANTEROL 100-25 MCG/INH IN AEPB: 1.0000 | INHALATION_SPRAY | Freq: Every day | RESPIRATORY_TRACT | 2 refills | Status: DC
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
## 2017-12-25 ENCOUNTER — Telehealth: Payer: Self-pay

## 2017-12-25 NOTE — Telephone Encounter (Signed)
Copied from [place] [id number]. Topic: Inquiry >> Dec 25, 2017  9:42 AM Maria Richardson, NT wrote: Reason for CRM: patient is calling to see if her BMI paperwork has been sent to her insurance company. >> Dec 25, 2017 10:54 AM Maria Richardson, NT wrote: Patient called and said she has re faxed her insurance paperwork to the practice and needs to have it completed today and would like someone to call her at [phone] to let her know it was received

## 2017-12-25 NOTE — Telephone Encounter (Signed)
I have not received her insurance paperwork, have you seen it?

## 2017-12-26 NOTE — Telephone Encounter (Signed)
Papers was found and has been sent off to be scanned.

## 2017-12-26 NOTE — Telephone Encounter (Signed)
I have not seen any of the paperwork mentioned.

## 2017-12-26 NOTE — Telephone Encounter (Signed)
I have checked the folders and file cabinet up front and did not see anything. Also checked the folders in the back and did not see anything. Sorry

## 2018-02-18 ENCOUNTER — Telehealth: Payer: Self-pay | Admitting: Family Medicine

## 2018-02-18 ENCOUNTER — Telehealth: Payer: Self-pay

## 2018-02-18 NOTE — Telephone Encounter (Signed)
Copied from [place] [id number]. Topic: General - Other >> Feb 18, 2018  9:09 AM Carolyn Stare wrote:  Pt ask if Dr Ancil Boozer nurse will give her a call  Patient called stating that she has been dizzy and is unsure what exactly is going on.  Patient was put in for an acute visit this week.

## 2018-02-18 NOTE — Telephone Encounter (Signed)
Is she using the voucher? We can ask for a sample, not sure when it will be here. Ask if Symbicort is cheaper under her insurance

## 2018-02-18 NOTE — Telephone Encounter (Signed)
We do not have any refills of the Breo in office, do you want Suanne Marker to see if she could get some more?

## 2018-02-18 NOTE — Telephone Encounter (Signed)
Pt scheduled an appt with Dr Ancil Boozer for this coming Thursday. She is completely out of Breo and would like to know if we have any samples. I informed her that she should have refills at the pharmacy she then stated that it was costing over $100 for the medication.

## 2018-02-19 NOTE — Telephone Encounter (Signed)
She has an appt with you this Thursday.

## 2018-02-21 ENCOUNTER — Other Ambulatory Visit: Payer: Self-pay | Admitting: Family Medicine

## 2018-02-21 ENCOUNTER — Encounter: Payer: Self-pay | Admitting: Family Medicine

## 2018-02-21 ENCOUNTER — Ambulatory Visit (INDEPENDENT_AMBULATORY_CARE_PROVIDER_SITE_OTHER): Payer: Managed Care, Other (non HMO) | Admitting: Family Medicine

## 2018-02-21 VITALS — BP 130/72 | HR 89 | Temp 97.5°F | Resp 16 | Ht 60.0 in | Wt 158.8 lb

## 2018-02-21 DIAGNOSIS — E039 Hypothyroidism, unspecified: Secondary | ICD-10-CM

## 2018-02-21 DIAGNOSIS — J449 Chronic obstructive pulmonary disease, unspecified: Secondary | ICD-10-CM

## 2018-02-21 DIAGNOSIS — R42 Dizziness and giddiness: Secondary | ICD-10-CM

## 2018-02-21 DIAGNOSIS — E559 Vitamin D deficiency, unspecified: Secondary | ICD-10-CM | POA: Diagnosis not present

## 2018-02-21 DIAGNOSIS — Z79899 Other long term (current) drug therapy: Secondary | ICD-10-CM

## 2018-02-21 DIAGNOSIS — R739 Hyperglycemia, unspecified: Secondary | ICD-10-CM

## 2018-02-21 DIAGNOSIS — E785 Hyperlipidemia, unspecified: Secondary | ICD-10-CM | POA: Diagnosis not present

## 2018-02-21 MED ORDER — VITAMIN D (ERGOCALCIFEROL) 1.25 MG (50000 UNIT) PO CAPS: 50000.0000 [IU] | ORAL_CAPSULE | ORAL | 0 refills | Status: DC

## 2018-02-21 MED ORDER — TRELEGY ELLIPTA 100-62.5-25 MCG/INH IN AEPB: 1.0000 | INHALATION_SPRAY | Freq: Every day | RESPIRATORY_TRACT | 5 refills | Status: DC
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
## 2018-02-22 LAB — CBC WITH DIFFERENTIAL/PLATELET
BASOS: 1 %
Basophils Absolute: 0.1 10*3/uL (ref 0.0–0.2)
EOS (ABSOLUTE): 0.4 10*3/uL (ref 0.0–0.4)
EOS: 7 %
HEMATOCRIT: 40.6 % (ref 34.0–46.6)
Hemoglobin: 13.4 g/dL (ref 11.1–15.9)
IMMATURE GRANS (ABS): 0 10*3/uL (ref 0.0–0.1)
IMMATURE GRANULOCYTES: 1 %
LYMPHS: 22 %
Lymphocytes Absolute: 1.5 10*3/uL (ref 0.7–3.1)
MCH: 29.6 pg (ref 26.6–33.0)
MCHC: 33 g/dL (ref 31.5–35.7)
MCV: 90 fL (ref 79–97)
MONOCYTES: 7 %
Monocytes Absolute: 0.4 10*3/uL (ref 0.1–0.9)
NEUTROS PCT: 62 %
Neutrophils Absolute: 4.2 10*3/uL (ref 1.4–7.0)
Platelets: 254 10*3/uL (ref 150–450)
RBC: 4.53 x10E6/uL (ref 3.77–5.28)
RDW: 13.6 % (ref 12.3–15.4)
WBC: 6.7 10*3/uL (ref 3.4–10.8)

## 2018-02-22 LAB — COMPREHENSIVE METABOLIC PANEL
A/G RATIO: 1.3 (ref 1.2–2.2)
ALT: 14 IU/L (ref 0–32)
AST: 21 IU/L (ref 0–40)
Albumin: 4.2 g/dL (ref 3.5–5.5)
Alkaline Phosphatase: 92 IU/L (ref 39–117)
BUN/Creatinine Ratio: 15 (ref 9–23)
BUN: 13 mg/dL (ref 6–24)
Bilirubin Total: 0.4 mg/dL (ref 0.0–1.2)
CALCIUM: 9.7 mg/dL (ref 8.7–10.2)
CO2: 24 mmol/L (ref 20–29)
Chloride: 101 mmol/L (ref 96–106)
Creatinine, Ser: 0.85 mg/dL (ref 0.57–1.00)
GFR, EST AFRICAN AMERICAN: 89 mL/min/{1.73_m2} (ref >59)
GFR, EST NON AFRICAN AMERICAN: 77 mL/min/{1.73_m2} (ref >59)
Globulin, Total: 3.2 g/dL (ref 1.5–4.5)
Glucose: 80 mg/dL (ref 65–99)
Potassium: 4.2 mmol/L (ref 3.5–5.2)
Sodium: 141 mmol/L (ref 134–144)
TOTAL PROTEIN: 7.4 g/dL (ref 6.0–8.5)

## 2018-02-22 LAB — LIPID PANEL
CHOL/HDL RATIO: 4 ratio (ref 0.0–4.4)
Cholesterol, Total: 199 mg/dL (ref 100–199)
HDL: 50 mg/dL (ref >39)
LDL Calculated: 137 mg/dL — ABNORMAL HIGH (ref 0–99)
Triglycerides: 60 mg/dL (ref 0–149)
VLDL CHOLESTEROL CAL: 12 mg/dL (ref 5–40)

## 2018-02-22 LAB — TSH: TSH: 10.12 u[IU]/mL — ABNORMAL HIGH (ref 0.450–4.500)

## 2018-02-22 LAB — HEMOGLOBIN A1C
Est. average glucose Bld gHb Est-mCnc: 126 mg/dL
Hgb A1c MFr Bld: 6 % — ABNORMAL HIGH (ref 4.8–5.6)

## 2018-02-22 LAB — VITAMIN D 25 HYDROXY (VIT D DEFICIENCY, FRACTURES): VIT D 25 HYDROXY: 36.9 ng/mL (ref 30.0–100.0)

## 2018-03-01 ENCOUNTER — Other Ambulatory Visit: Payer: Self-pay | Admitting: Family Medicine

## 2018-03-01 DIAGNOSIS — E039 Hypothyroidism, unspecified: Secondary | ICD-10-CM

## 2018-03-01 MED ORDER — LEVOTHYROXINE SODIUM 150 MCG PO TABS: 150.0000 ug | ORAL_TABLET | Freq: Every day | ORAL | 1 refills | Status: DC

## 2018-03-05 ENCOUNTER — Ambulatory Visit: Payer: Self-pay

## 2018-03-05 DIAGNOSIS — R42 Dizziness and giddiness: Secondary | ICD-10-CM

## 2018-03-05 NOTE — Telephone Encounter (Signed)
Pt. Called to report her dizziness had stopped for a few days after she saw Dr. Ancil Boozer. It has come back again. Bending down seems to trigger it. States her new inhalers are helping her breathing.Concerned because she is still having dizziness, and wants to know what Dr. Ancil Boozer thinks. Is staying hydrated and has completely cut out sodas. Please advise pt.  Answer Assessment - Initial Assessment Questions 1. DESCRIPTION: "Describe your dizziness."     Lightheaded 2. LIGHTHEADED: "Do you feel lightheaded?" (e.g., somewhat faint, woozy, weak upon standing)     Lightheaded 3. VERTIGO: "Do you feel like either you or the room is spinning or tilting?" (i.e. vertigo)     Sometimes 4. SEVERITY: "How bad is it?"  "Do you feel like you are going to faint?" "Can you stand and walk?"   - MILD - walking normally   - MODERATE - interferes with normal activities (e.g., work, school)    - SEVERE - unable to stand, requires support to walk, feels like passing out now.      Mild 5. ONSET:  "When did the dizziness begin?"     Started 2-3 weeks ago 6. AGGRAVATING FACTORS: "Does anything make it worse?" (e.g., standing, change in head position)     When she eats and bending down 7. HEART RATE: "Can you tell me your heart rate?" "How many beats in 15 seconds?"  (Note: not all patients can do this)       No 8. CAUSE: "What do you think is causing the dizziness?"     Unsure 9. RECURRENT SYMPTOM: "Have you had dizziness before?" If so, ask: "When was the last time?" "What happened that time?"     Yes - saw Dr. Ancil Boozer 10. OTHER SYMPTOMS: "Do you have any other symptoms?" (e.g., fever, chest pain, vomiting, diarrhea, bleeding)       No 11. PREGNANCY: "Is there any chance you are pregnant?" "When was your last menstrual period?"       No  Protocols used: DIZZINESS [facility]

## 2018-03-06 NOTE — Addendum Note (Signed)
Addended by: Loistine Chance on: 03/06/2018 08:38 AM   Modules accepted: Orders

## 2018-03-06 NOTE — Telephone Encounter (Signed)
Her labs were normal, it may be secondary to thyroid being out of control, but recommend evaluation by ENT in the mean time. I will place referral for vertigo since she states she has episodes of spinning sensation

## 2018-04-30 ENCOUNTER — Telehealth: Payer: Self-pay | Admitting: Family Medicine

## 2018-04-30 DIAGNOSIS — E039 Hypothyroidism, unspecified: Secondary | ICD-10-CM

## 2018-04-30 NOTE — Telephone Encounter (Signed)
She needs to have repeat TSH, last level was off

## 2018-04-30 NOTE — Telephone Encounter (Signed)
Patient states that she has not taken her medication in 3 days because it makes her stomach upset and she feel bad when she takes it.

## 2018-04-30 NOTE — Addendum Note (Signed)
Addended by: Chilton Greathouse on: 04/30/2018 02:05 PM   Modules accepted: Orders

## 2018-04-30 NOTE — Telephone Encounter (Signed)
It is okay to still have labs done

## 2018-04-30 NOTE — Telephone Encounter (Signed)
Copied from [place] [id number]. Topic: Quick Communication - See Telephone Encounter >> Apr 30, 2018  9:10 AM Conception Chancy, NT wrote: CRM for notification. See Telephone encounter for: 04/30/18.  Patient is calling and states levothyroxine (SYNTHROID, LEVOTHROID) 150 MCG tablet  is not agreeing with her body. She states she has been more tired, feeling down, does not appetite and stomach upset.

## 2018-05-01 ENCOUNTER — Other Ambulatory Visit: Payer: Self-pay | Admitting: Family Medicine

## 2018-05-01 NOTE — Telephone Encounter (Signed)
Patient called and Patient is aware.

## 2018-05-02 ENCOUNTER — Other Ambulatory Visit: Payer: Self-pay | Admitting: Family Medicine

## 2018-05-02 DIAGNOSIS — E039 Hypothyroidism, unspecified: Secondary | ICD-10-CM

## 2018-05-02 LAB — TSH: TSH: 5.71 u[IU]/mL — AB (ref 0.450–4.500)

## 2018-05-02 MED ORDER — LEVOTHYROXINE SODIUM 150 MCG PO TABS: 150.0000 ug | ORAL_TABLET | Freq: Every day | ORAL | 1 refills | Status: DC

## 2018-05-03 ENCOUNTER — Other Ambulatory Visit: Payer: Self-pay | Admitting: Family Medicine

## 2018-05-03 ENCOUNTER — Telehealth: Payer: Self-pay | Admitting: Family Medicine

## 2018-05-03 NOTE — Telephone Encounter (Signed)
Copied from [place] [id number]. Topic: Quick Communication - See Telephone Encounter >> May 03, 2018  4:02 PM Vernona Rieger wrote: CRM for notification. See Telephone encounter for: 05/03/18.  Pt would like the nurse to call her back about her thyroid. She would like to know what is she suppose to be taking because shes been without it for 4 days [phone]

## 2018-05-04 ENCOUNTER — Other Ambulatory Visit: Payer: Self-pay | Admitting: Family Medicine

## 2018-05-04 DIAGNOSIS — E039 Hypothyroidism, unspecified: Secondary | ICD-10-CM

## 2018-05-09 ENCOUNTER — Other Ambulatory Visit: Payer: Self-pay | Admitting: Family Medicine

## 2018-05-09 DIAGNOSIS — E039 Hypothyroidism, unspecified: Secondary | ICD-10-CM

## 2018-05-09 MED ORDER — LEVOTHYROXINE SODIUM 137 MCG PO TABS: 137.0000 ug | ORAL_TABLET | Freq: Every day | ORAL | 0 refills | Status: DC

## 2018-05-09 NOTE — Telephone Encounter (Signed)
Pt is calling to let dr Ancil Boozer know she has been taking 137 mcg for last 5 weeks and pt did skip a few times and was off med for 3 day prior to blood draw. After 5 wks on 137 mcg pt switch to 150 mcg and she took 3 pills for 3 day and did not look the reaction and she has been off med for 8 days including today. Please advise

## 2018-05-09 NOTE — Telephone Encounter (Signed)
Ask her to take 137 mcg M-Saturday and one 150 mcg on Sundays to get TSH to goal without causing her to feel jittery, recheck TSH in 6 weeks Thank you

## 2018-05-09 NOTE — Telephone Encounter (Signed)
Pt called in stating that medication was sent in wrong. She indicates that she is not to be on levothyroxine (SYNTHROID, LEVOTHROID) 150 MCG tablet. She had blood work done and was told to adjust dose. Pt states this medication made her stomach hurt, loss of appetite, moody, and depressed. She said it may be too much for her system. Please call pt to advise.   [id number] - [address], [place] [phone] (Phone) [phone] (Fax)

## 2018-05-09 NOTE — Telephone Encounter (Signed)
Called patient and left detailed voice message I need to know the dose of the levothyroxine she is currently taking ( prior to last labs) and how many days a week, so I can send a new rx with the correct directions.  Thank you

## 2018-05-09 NOTE — Telephone Encounter (Signed)
Patient is returning call to Dr. Ancil Boozer and asked if the doctor could call her to discuss the dosage.  CB# [phone]

## 2018-05-10 NOTE — Telephone Encounter (Signed)
Patient notified but states if that 150 mcg of Levothyroxine makes her feel foolish she would not take it. She verbalized understanding to come back around 6 weeks- March 20 to recheck her TSH.

## 2018-06-14 ENCOUNTER — Ambulatory Visit: Payer: Managed Care, Other (non HMO) | Admitting: Family Medicine

## 2018-10-17 ENCOUNTER — Ambulatory Visit (INDEPENDENT_AMBULATORY_CARE_PROVIDER_SITE_OTHER): Payer: Managed Care, Other (non HMO) | Admitting: Family Medicine

## 2018-10-17 ENCOUNTER — Encounter: Payer: Self-pay | Admitting: Family Medicine

## 2018-10-17 ENCOUNTER — Other Ambulatory Visit: Payer: Self-pay | Admitting: Family Medicine

## 2018-10-17 ENCOUNTER — Other Ambulatory Visit: Payer: Self-pay

## 2018-10-17 DIAGNOSIS — M79642 Pain in left hand: Secondary | ICD-10-CM

## 2018-10-17 DIAGNOSIS — R739 Hyperglycemia, unspecified: Secondary | ICD-10-CM

## 2018-10-17 DIAGNOSIS — J4489 Other specified chronic obstructive pulmonary disease: Secondary | ICD-10-CM

## 2018-10-17 DIAGNOSIS — Z8261 Family history of arthritis: Secondary | ICD-10-CM

## 2018-10-17 DIAGNOSIS — E039 Hypothyroidism, unspecified: Secondary | ICD-10-CM | POA: Diagnosis not present

## 2018-10-17 DIAGNOSIS — M659 Unspecified synovitis and tenosynovitis, unspecified site: Secondary | ICD-10-CM

## 2018-10-17 DIAGNOSIS — M79641 Pain in right hand: Secondary | ICD-10-CM | POA: Diagnosis not present

## 2018-10-17 DIAGNOSIS — J449 Chronic obstructive pulmonary disease, unspecified: Secondary | ICD-10-CM | POA: Diagnosis not present

## 2018-10-17 MED ORDER — TRAMADOL HCL 50 MG PO TABS: 50.0000 mg | ORAL_TABLET | Freq: Every day | ORAL | 0 refills | Status: AC

## 2018-10-17 MED ORDER — MELOXICAM 15 MG PO TABS: 15.0000 mg | ORAL_TABLET | Freq: Every day | ORAL | 0 refills | Status: DC
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
## 2018-10-19 LAB — COMPREHENSIVE METABOLIC PANEL
ALT: 9 IU/L (ref 0–32)
AST: 17 IU/L (ref 0–40)
Albumin/Globulin Ratio: 1.2 (ref 1.2–2.2)
Albumin: 3.8 g/dL (ref 3.8–4.9)
Alkaline Phosphatase: 89 IU/L (ref 39–117)
BUN/Creatinine Ratio: 12 (ref 9–23)
BUN: 12 mg/dL (ref 6–24)
Bilirubin Total: 0.5 mg/dL (ref 0.0–1.2)
CO2: 21 mmol/L (ref 20–29)
Calcium: 9.5 mg/dL (ref 8.7–10.2)
Chloride: 106 mmol/L (ref 96–106)
Creatinine, Ser: 1.03 mg/dL — ABNORMAL HIGH (ref 0.57–1.00)
GFR calc Af Amer: 70 mL/min/{1.73_m2} (ref >59)
GFR calc non Af Amer: 60 mL/min/{1.73_m2} (ref >59)
Globulin, Total: 3.2 g/dL (ref 1.5–4.5)
Glucose: 118 mg/dL — ABNORMAL HIGH (ref 65–99)
Potassium: 4.1 mmol/L (ref 3.5–5.2)
Sodium: 143 mmol/L (ref 134–144)
Total Protein: 7 g/dL (ref 6.0–8.5)

## 2018-10-19 LAB — CBC WITH DIFFERENTIAL/PLATELET
Basophils Absolute: 0.1 x10E3/uL (ref 0.0–0.2)
Basos: 1 %
EOS (ABSOLUTE): 0.3 x10E3/uL (ref 0.0–0.4)
Eos: 4 %
Hematocrit: 41.1 % (ref 34.0–46.6)
Hemoglobin: 12.9 g/dL (ref 11.1–15.9)
Immature Grans (Abs): 0 x10E3/uL (ref 0.0–0.1)
Immature Granulocytes: 0 %
Lymphocytes Absolute: 1.3 x10E3/uL (ref 0.7–3.1)
Lymphs: 15 %
MCH: 28 pg (ref 26.6–33.0)
MCHC: 31.4 g/dL — ABNORMAL LOW (ref 31.5–35.7)
MCV: 89 fL (ref 79–97)
Monocytes Absolute: 0.5 x10E3/uL (ref 0.1–0.9)
Monocytes: 6 %
Neutrophils Absolute: 6 x10E3/uL (ref 1.4–7.0)
Neutrophils: 74 %
Platelets: 306 x10E3/uL (ref 150–450)
RBC: 4.6 x10E6/uL (ref 3.77–5.28)
RDW: 14 % (ref 11.7–15.4)
WBC: 8.1 x10E3/uL (ref 3.4–10.8)

## 2018-10-19 LAB — HEMOGLOBIN A1C
Est. average glucose Bld gHb Est-mCnc: 128 mg/dL
Hgb A1c MFr Bld: 6.1 % — ABNORMAL HIGH (ref 4.8–5.6)

## 2018-10-19 LAB — TSH: TSH: 16.2 u[IU]/mL — ABNORMAL HIGH (ref 0.450–4.500)

## 2018-10-19 LAB — ANA,IFA RA DIAG PNL W/RFLX TIT/PATN
ANA Titer 1: NEGATIVE
Cyclic Citrullin Peptide Ab: 8 U (ref 0–19)
Rheumatoid fact SerPl-aCnc: <10 [IU]/mL (ref 0.0–13.9)

## 2018-10-19 LAB — URIC ACID: Uric Acid: 5.1 mg/dL (ref 2.5–7.1)

## 2018-10-21 ENCOUNTER — Other Ambulatory Visit: Payer: Self-pay | Admitting: Family Medicine

## 2018-10-21 DIAGNOSIS — E039 Hypothyroidism, unspecified: Secondary | ICD-10-CM

## 2018-10-21 MED ORDER — LEVOTHYROXINE SODIUM 137 MCG PO TABS: 137.0000 ug | ORAL_TABLET | Freq: Every day | ORAL | 0 refills | Status: DC

## 2018-10-22 ENCOUNTER — Other Ambulatory Visit: Payer: Self-pay | Admitting: Family Medicine

## 2018-10-22 DIAGNOSIS — M7989 Other specified soft tissue disorders: Secondary | ICD-10-CM

## 2018-11-21 ENCOUNTER — Other Ambulatory Visit: Payer: Self-pay | Admitting: Family Medicine

## 2018-11-21 DIAGNOSIS — J449 Chronic obstructive pulmonary disease, unspecified: Secondary | ICD-10-CM

## 2018-11-21 NOTE — Telephone Encounter (Signed)
Pt aware that prescription has been sent to pharmacy and she will call back to schedule her appt in Nov or Dec

## 2019-01-20 ENCOUNTER — Other Ambulatory Visit: Payer: Self-pay | Admitting: Family Medicine

## 2019-01-20 DIAGNOSIS — E039 Hypothyroidism, unspecified: Secondary | ICD-10-CM

## 2019-01-25 ENCOUNTER — Other Ambulatory Visit: Payer: Self-pay | Admitting: Family Medicine

## 2019-01-25 DIAGNOSIS — J449 Chronic obstructive pulmonary disease, unspecified: Secondary | ICD-10-CM

## 2019-04-04 DIAGNOSIS — Z8616 Personal history of COVID-19: Secondary | ICD-10-CM | POA: Insufficient documentation

## 2019-04-11 ENCOUNTER — Other Ambulatory Visit: Payer: Self-pay | Admitting: Family Medicine

## 2019-04-11 DIAGNOSIS — J449 Chronic obstructive pulmonary disease, unspecified: Secondary | ICD-10-CM

## 2019-04-11 NOTE — Telephone Encounter (Signed)
No refill protocol for Trelegy ellipta 100-62.5  Please review for refill.

## 2019-04-14 NOTE — Telephone Encounter (Signed)
Appt made 04/29/2019

## 2019-04-15 ENCOUNTER — Telehealth: Payer: Self-pay

## 2019-04-15 DIAGNOSIS — J449 Chronic obstructive pulmonary disease, unspecified: Secondary | ICD-10-CM

## 2019-04-15 MED ORDER — TRELEGY ELLIPTA 100-62.5-25 MCG/INH IN AEPB: 1.0000 | INHALATION_SPRAY | Freq: Every day | RESPIRATORY_TRACT | 0 refills | Status: DC

## 2019-04-15 NOTE — Telephone Encounter (Signed)
Refill request for general medication. Trelegy to [facility]  Last office visit 10/17/2018  Follow up on 04/29/2019

## 2019-04-16 ENCOUNTER — Other Ambulatory Visit: Payer: Self-pay | Admitting: Family Medicine

## 2019-04-16 DIAGNOSIS — J449 Chronic obstructive pulmonary disease, unspecified: Secondary | ICD-10-CM

## 2019-04-16 NOTE — Telephone Encounter (Signed)
Per insurance, pt needs 90 day supply. Please advise, pt is completely out.

## 2019-04-16 NOTE — Telephone Encounter (Signed)
Pt states her insurance will not cover for a 60 day supply of the Trelegy that it has to be 90 day supply

## 2019-04-17 NOTE — Telephone Encounter (Signed)
Pt has been notified and will send someone to pick up

## 2019-04-21 ENCOUNTER — Emergency Department: Payer: Managed Care, Other (non HMO)

## 2019-04-21 ENCOUNTER — Other Ambulatory Visit: Payer: Managed Care, Other (non HMO)

## 2019-04-21 ENCOUNTER — Inpatient Hospital Stay
Admission: EM | Admit: 2019-04-21 | Discharge: 2019-04-24 | DRG: 177 | Disposition: A | Discharge status: Home / Self Care | Payer: Managed Care, Other (non HMO) | Attending: Internal Medicine | Admitting: Internal Medicine

## 2019-04-21 ENCOUNTER — Other Ambulatory Visit: Payer: Self-pay

## 2019-04-21 ENCOUNTER — Encounter: Payer: Self-pay | Admitting: Emergency Medicine

## 2019-04-21 DIAGNOSIS — I959 Hypotension, unspecified: Secondary | ICD-10-CM | POA: Diagnosis not present

## 2019-04-21 DIAGNOSIS — Z79890 Hormone replacement therapy (postmenopausal): Secondary | ICD-10-CM

## 2019-04-21 DIAGNOSIS — E861 Hypovolemia: Secondary | ICD-10-CM | POA: Diagnosis not present

## 2019-04-21 DIAGNOSIS — Z7722 Contact with and (suspected) exposure to environmental tobacco smoke (acute) (chronic): Secondary | ICD-10-CM | POA: Diagnosis present

## 2019-04-21 DIAGNOSIS — E039 Hypothyroidism, unspecified: Secondary | ICD-10-CM | POA: Diagnosis present

## 2019-04-21 DIAGNOSIS — J9601 Acute respiratory failure with hypoxia: Secondary | ICD-10-CM | POA: Diagnosis present

## 2019-04-21 DIAGNOSIS — R0602 Shortness of breath: Secondary | ICD-10-CM

## 2019-04-21 DIAGNOSIS — J22 Unspecified acute lower respiratory infection: Secondary | ICD-10-CM

## 2019-04-21 DIAGNOSIS — Z841 Family history of disorders of kidney and ureter: Secondary | ICD-10-CM

## 2019-04-21 DIAGNOSIS — Z9049 Acquired absence of other specified parts of digestive tract: Secondary | ICD-10-CM

## 2019-04-21 DIAGNOSIS — Z791 Long term (current) use of non-steroidal anti-inflammatories (NSAID): Secondary | ICD-10-CM

## 2019-04-21 DIAGNOSIS — Z87891 Personal history of nicotine dependence: Secondary | ICD-10-CM

## 2019-04-21 DIAGNOSIS — Z79899 Other long term (current) drug therapy: Secondary | ICD-10-CM

## 2019-04-21 DIAGNOSIS — J1282 Pneumonia due to coronavirus disease 2019: Secondary | ICD-10-CM | POA: Diagnosis present

## 2019-04-21 DIAGNOSIS — U071 COVID-19: Principal | ICD-10-CM | POA: Diagnosis present

## 2019-04-21 DIAGNOSIS — Z833 Family history of diabetes mellitus: Secondary | ICD-10-CM

## 2019-04-21 DIAGNOSIS — L280 Lichen simplex chronicus: Secondary | ICD-10-CM | POA: Diagnosis present

## 2019-04-21 DIAGNOSIS — Z885 Allergy status to narcotic agent status: Secondary | ICD-10-CM

## 2019-04-21 DIAGNOSIS — E559 Vitamin D deficiency, unspecified: Secondary | ICD-10-CM | POA: Diagnosis present

## 2019-04-21 DIAGNOSIS — J45909 Unspecified asthma, uncomplicated: Secondary | ICD-10-CM | POA: Diagnosis present

## 2019-04-21 DIAGNOSIS — Z8249 Family history of ischemic heart disease and other diseases of the circulatory system: Secondary | ICD-10-CM

## 2019-04-21 DIAGNOSIS — Z800 Family history of malignant neoplasm of digestive organs: Secondary | ICD-10-CM

## 2019-04-21 LAB — CBC WITH DIFFERENTIAL/PLATELET
Abs Immature Granulocytes: 0.03 10*3/uL (ref 0.00–0.07)
Basophils Absolute: 0 10*3/uL (ref 0.0–0.1)
Basophils Relative: 0 %
Eosinophils Absolute: 0.2 10*3/uL (ref 0.0–0.5)
Eosinophils Relative: 5 %
HCT: 42.8 % (ref 36.0–46.0)
Hemoglobin: 13.5 g/dL (ref 12.0–15.0)
Immature Granulocytes: 1 %
Lymphocytes Relative: 25 %
Lymphs Abs: 1.1 10*3/uL (ref 0.7–4.0)
MCH: 28.6 pg (ref 26.0–34.0)
MCHC: 31.5 g/dL (ref 30.0–36.0)
MCV: 90.7 fL (ref 80.0–100.0)
Monocytes Absolute: 0.4 10*3/uL (ref 0.1–1.0)
Monocytes Relative: 9 %
Neutro Abs: 2.8 10*3/uL (ref 1.7–7.7)
Neutrophils Relative %: 60 %
Platelets: 234 10*3/uL (ref 150–400)
RBC: 4.72 MIL/uL (ref 3.87–5.11)
RDW: 15.6 % — ABNORMAL HIGH (ref 11.5–15.5)
WBC: 4.6 10*3/uL (ref 4.0–10.5)
nRBC: 0 % (ref 0.0–0.2)

## 2019-04-21 LAB — COMPREHENSIVE METABOLIC PANEL
ALT: 13 U/L (ref 0–44)
AST: 20 U/L (ref 15–41)
Albumin: 3.5 g/dL (ref 3.5–5.0)
Alkaline Phosphatase: 81 U/L (ref 38–126)
Anion gap: 9 (ref 5–15)
BUN: 10 mg/dL (ref 6–20)
CO2: 28 mmol/L (ref 22–32)
Calcium: 9 mg/dL (ref 8.9–10.3)
Chloride: 101 mmol/L (ref 98–111)
Creatinine, Ser: 0.89 mg/dL (ref 0.44–1.00)
GFR calc Af Amer: >60 mL/min (ref >60)
GFR calc non Af Amer: >60 mL/min (ref >60)
Glucose, Bld: 120 mg/dL — ABNORMAL HIGH (ref 70–99)
Potassium: 4 mmol/L (ref 3.5–5.1)
Sodium: 138 mmol/L (ref 135–145)
Total Bilirubin: 0.7 mg/dL (ref 0.3–1.2)
Total Protein: 7.6 g/dL (ref 6.5–8.1)

## 2019-04-21 LAB — TROPONIN I (HIGH SENSITIVITY): Troponin I (High Sensitivity): <2 ng/L (ref <18)

## 2019-04-21 NOTE — ED Notes (Signed)
Patient states was tested Friday and notified yesterday she was COVID positive. Patient states she began having COVID symptoms Sunday. Patient states today (Monday) she was having issues with coughing and used a pulse ox and O2 was in mid 80's%. Patient currently on 2 liter N/C with O2 98%.

## 2019-04-21 NOTE — ED Triage Notes (Signed)
Pt presents to ED via POV with c/o low oxygen levels at home. Pt states is 8 days into her Covid+ quarantine. Pt states hx of asthma. Pt states was checking her oxygen levels at home via at pulse ox. Pt states at home O2 80-81% on RA. Upon arrival to ED pt's O2 87% on RA after ambulating.

## 2019-04-22 ENCOUNTER — Inpatient Hospital Stay
Admission: AD | Admit: 2019-04-22 | Payer: Managed Care, Other (non HMO) | Source: Other Acute Inpatient Hospital | Admitting: Internal Medicine

## 2019-04-22 ENCOUNTER — Other Ambulatory Visit: Payer: Self-pay | Admitting: Family Medicine

## 2019-04-22 DIAGNOSIS — J1282 Pneumonia due to coronavirus disease 2019: Secondary | ICD-10-CM

## 2019-04-22 DIAGNOSIS — J9601 Acute respiratory failure with hypoxia: Secondary | ICD-10-CM | POA: Diagnosis present

## 2019-04-22 DIAGNOSIS — Z885 Allergy status to narcotic agent status: Secondary | ICD-10-CM | POA: Diagnosis not present

## 2019-04-22 DIAGNOSIS — Z791 Long term (current) use of non-steroidal anti-inflammatories (NSAID): Secondary | ICD-10-CM | POA: Diagnosis not present

## 2019-04-22 DIAGNOSIS — Z8249 Family history of ischemic heart disease and other diseases of the circulatory system: Secondary | ICD-10-CM | POA: Diagnosis not present

## 2019-04-22 DIAGNOSIS — E039 Hypothyroidism, unspecified: Secondary | ICD-10-CM

## 2019-04-22 DIAGNOSIS — E861 Hypovolemia: Secondary | ICD-10-CM | POA: Diagnosis not present

## 2019-04-22 DIAGNOSIS — Z79899 Other long term (current) drug therapy: Secondary | ICD-10-CM | POA: Diagnosis not present

## 2019-04-22 DIAGNOSIS — R0602 Shortness of breath: Secondary | ICD-10-CM | POA: Diagnosis present

## 2019-04-22 DIAGNOSIS — J45909 Unspecified asthma, uncomplicated: Secondary | ICD-10-CM | POA: Diagnosis present

## 2019-04-22 DIAGNOSIS — U071 COVID-19: Principal | ICD-10-CM

## 2019-04-22 DIAGNOSIS — Z9049 Acquired absence of other specified parts of digestive tract: Secondary | ICD-10-CM | POA: Diagnosis not present

## 2019-04-22 DIAGNOSIS — Z7722 Contact with and (suspected) exposure to environmental tobacco smoke (acute) (chronic): Secondary | ICD-10-CM | POA: Diagnosis present

## 2019-04-22 DIAGNOSIS — E559 Vitamin D deficiency, unspecified: Secondary | ICD-10-CM | POA: Diagnosis present

## 2019-04-22 DIAGNOSIS — I9589 Other hypotension: Secondary | ICD-10-CM | POA: Diagnosis not present

## 2019-04-22 DIAGNOSIS — Z800 Family history of malignant neoplasm of digestive organs: Secondary | ICD-10-CM | POA: Diagnosis not present

## 2019-04-22 DIAGNOSIS — Z833 Family history of diabetes mellitus: Secondary | ICD-10-CM | POA: Diagnosis not present

## 2019-04-22 DIAGNOSIS — Z79890 Hormone replacement therapy (postmenopausal): Secondary | ICD-10-CM | POA: Diagnosis not present

## 2019-04-22 DIAGNOSIS — I959 Hypotension, unspecified: Secondary | ICD-10-CM | POA: Diagnosis not present

## 2019-04-22 DIAGNOSIS — Z841 Family history of disorders of kidney and ureter: Secondary | ICD-10-CM | POA: Diagnosis not present

## 2019-04-22 DIAGNOSIS — L280 Lichen simplex chronicus: Secondary | ICD-10-CM | POA: Diagnosis present

## 2019-04-22 DIAGNOSIS — Z87891 Personal history of nicotine dependence: Secondary | ICD-10-CM | POA: Diagnosis not present

## 2019-04-22 LAB — TRIGLYCERIDES: Triglycerides: 66 mg/dL (ref <150)

## 2019-04-22 LAB — SARS CORONAVIRUS 2 (TAT 6-24 HRS): SARS Coronavirus 2: POSITIVE — AB

## 2019-04-22 LAB — FIBRIN DERIVATIVES D-DIMER (ARMC ONLY): Fibrin derivatives D-dimer (ARMC): 1195.64 ng/mL (FEU) — ABNORMAL HIGH (ref 0.00–499.00)

## 2019-04-22 LAB — HIV ANTIBODY (ROUTINE TESTING W REFLEX): HIV Screen 4th Generation wRfx: NONREACTIVE

## 2019-04-22 LAB — ABO/RH: ABO/RH(D): O POS

## 2019-04-22 LAB — FERRITIN: Ferritin: 438 ng/mL — ABNORMAL HIGH (ref 11–307)

## 2019-04-22 LAB — PROCALCITONIN: Procalcitonin: <0.1 ng/mL

## 2019-04-22 LAB — C-REACTIVE PROTEIN: CRP: 6.6 mg/dL — ABNORMAL HIGH (ref <1.0)

## 2019-04-22 LAB — LACTATE DEHYDROGENASE: LDH: 221 U/L — ABNORMAL HIGH (ref 98–192)

## 2019-04-22 LAB — LACTIC ACID, PLASMA: Lactic Acid, Venous: 0.9 mmol/L (ref 0.5–1.9)

## 2019-04-22 LAB — BRAIN NATRIURETIC PEPTIDE: B Natriuretic Peptide: 22 pg/mL (ref 0.0–100.0)

## 2019-04-22 LAB — FIBRINOGEN: Fibrinogen: 562 mg/dL — ABNORMAL HIGH (ref 210–475)

## 2019-04-22 MED ORDER — DEXAMETHASONE SODIUM PHOSPHATE 10 MG/ML IJ SOLN
10.0000 mg | Freq: Once | INTRAMUSCULAR | Status: AC
  Administered 2019-04-22: 10 mg via INTRAVENOUS
  Filled 2019-04-22: qty 1

## 2019-04-22 MED ORDER — ENOXAPARIN SODIUM 40 MG/0.4ML ~~LOC~~ SOLN
40.0000 mg | SUBCUTANEOUS | Status: DC
  Administered 2019-04-22 – 2019-04-24 (×3): 40 mg via SUBCUTANEOUS
  Filled 2019-04-22 (×3): qty 0.4

## 2019-04-22 MED ORDER — SODIUM CHLORIDE 0.9 % IV SOLN
100.0000 mg | Freq: Every day | INTRAVENOUS | Status: DC
  Administered 2019-04-23 – 2019-04-24 (×2): 100 mg via INTRAVENOUS
  Filled 2019-04-22 (×3): qty 20

## 2019-04-22 MED ORDER — UMECLIDINIUM BROMIDE 62.5 MCG/INH IN AEPB
1.0000 | INHALATION_SPRAY | Freq: Every day | RESPIRATORY_TRACT | Status: DC
  Administered 2019-04-22 – 2019-04-24 (×3): 1 via RESPIRATORY_TRACT
  Filled 2019-04-22: qty 7

## 2019-04-22 MED ORDER — FLUTICASONE-UMECLIDIN-VILANT 100-62.5-25 MCG/INH IN AEPB: 1.0000 | INHALATION_SPRAY | Freq: Every day | RESPIRATORY_TRACT | Status: DC

## 2019-04-22 MED ORDER — DEXAMETHASONE SODIUM PHOSPHATE 10 MG/ML IJ SOLN
6.0000 mg | INTRAMUSCULAR | Status: DC
  Administered 2019-04-23: 22:00:00 6 mg via INTRAVENOUS
  Filled 2019-04-22: qty 1

## 2019-04-22 MED ORDER — FLUTICASONE FUROATE-VILANTEROL 100-25 MCG/INH IN AEPB
1.0000 | INHALATION_SPRAY | Freq: Every day | RESPIRATORY_TRACT | Status: DC
  Administered 2019-04-22 – 2019-04-24 (×3): 1 via RESPIRATORY_TRACT
  Filled 2019-04-22: qty 28

## 2019-04-22 MED ORDER — LEVOTHYROXINE SODIUM 137 MCG PO TABS
137.0000 ug | ORAL_TABLET | Freq: Every day | ORAL | Status: DC
  Administered 2019-04-22 – 2019-04-24 (×3): 137 ug via ORAL
  Filled 2019-04-22 (×4): qty 1

## 2019-04-22 MED ORDER — SODIUM CHLORIDE 0.9 % IV SOLN
200.0000 mg | Freq: Once | INTRAVENOUS | Status: AC
  Administered 2019-04-22: 200 mg via INTRAVENOUS
  Filled 2019-04-22: qty 40

## 2019-04-22 NOTE — Progress Notes (Signed)
Patient ID: Maria Richardson, female   DOB: [birth year], 59 y.o.   MRN: [id number]  Patient's transfer to [facility] has been canceled. She will be admitted at [facility].

## 2019-04-22 NOTE — Discharge Summary (Signed)
Maria Richardson at [facility] NAME: Maria Richardson    MR#:  [id number]  DATE OF BIRTH:  Nov 29, 1960  DATE OF ADMISSION:  04/21/2019 ADMITTING PHYSICIAN: Orene Desanctis, DO  DATE OF DISCHARGE: 04/22/2019  PRIMARY CARE PHYSICIAN: Steele Sizer, MD    ADMISSION DIAGNOSIS:  Pneumonia due to COVID-19 virus [U07.1, J12.82]  DISCHARGE DIAGNOSIS:  Acute Hypoxic spectra failure secondary to bilateral COVID pneumonia  h/o asthma  SECONDARY DIAGNOSIS:   Past Medical History:  Diagnosis Date  . Asthma   . Eczema   . Hypoglycemia   . Hypothyroidism   . Shortness of breath dyspnea    because of asthma  . Wears dentures    full lower    HOSPITAL COURSE:  Maria Richardson is a 59 y.o. female with medical history significant of asthma and hypothyroidism who presents with concerns of hypoxia. Patient reports that she tested positive for Covid on 1/15 shortly after her husband was also positive.  She has been having productive cough and last night just did not sleep well.  Has been having intermittent dizziness. Has decreased appetite.  Today her friend brought over a pulse ox and found that her oxygen saturation was in the 80 to 82% on room air and prompted her to present to the ED.  #Acute hypoxic respiratory failure secondary to COVID pneumonia -On 2L  -Maintain O2 > 92%  -IV Decadron  -IV Remdesivir day 2/5 -Monitor inflammatory markers ---> CRP 6.6 -ferritin 438 -Cxr shows bilateral infiltrates  # Asthma -Not in acute exacerbation Continue bronchodilator  # Hypothyroidism Continue levothyroxine  DVT prophylaxis:.Lovenox Code Status: Full Family Communication: Plan discussed with patient at bedside  disposition Plan: transfer to [facility] and then Home   Consults called: none Admission status: inpatient  Discussed with patient regarding transferring to [facility] given bed crunch here at [facility]. She is in agreement.  Nursing supervisor is  aware of patient being transferred. She is agreeable with the plan to try get pt admitted at [facility]  CONSULTS OBTAINED:    DRUG ALLERGIES:   Allergies  Allergen Reactions  . Codeine Nausea And Vomiting    DISCHARGE MEDICATIONS:   Allergies as of 04/22/2019      Reactions   Codeine Nausea And Vomiting      Medication List    STOP taking these medications   albuterol 108 (90 Base) MCG/ACT inhaler Commonly known as: VENTOLIN HFA   fluorouracil 5 % cream Commonly known as: Efudex   loratadine 10 MG tablet Commonly known as: CLARITIN   meloxicam 15 MG tablet Commonly known as: MOBIC   triamcinolone 0.025 % ointment Commonly known as: KENALOG   Vitamin D (Ergocalciferol) 1.25 MG (50000 UNIT) Caps capsule Commonly known as: DRISDOL     TAKE these medications   calcipotriene 0.005 % cream Commonly known as: DOVONOX APP AA BID   levothyroxine 137 MCG tablet Commonly known as: SYNTHROID TAKE 1 TABLET(137 MCG) BY MOUTH DAILY BEFORE BREAKFAST   Trelegy Ellipta 100-62.5-25 MCG/INH Aepb Generic drug: Fluticasone-Umeclidin-Vilant Take 1 puff by mouth daily.       If you experience worsening of your admission symptoms, develop shortness of breath, life threatening emergency, suicidal or homicidal thoughts you must seek medical attention immediately by calling 911 or calling your MD immediately  if symptoms less severe.  You Must read complete instructions/literature along with all the possible adverse reactions/side effects for all the Medicines you take and that have been  prescribed to you. Take any new Medicines after you have completely understood and accept all the possible adverse reactions/side effects.   Please note  You were cared for by a hospitalist during your hospital stay. If you have any questions about your discharge medications or the care you received while you were in the hospital after you are discharged, you can call the unit and asked to speak with the  hospitalist on call if the hospitalist that took care of you is not available. Once you are discharged, your primary care physician will handle any further medical issues. Please note that NO REFILLS for any discharge medications will be authorized once you are discharged, as it is imperative that you return to your primary care physician (or establish a relationship with a primary care physician if you do not have one) for your aftercare needs so that they can reassess your need for medications and monitor your lab values. Today   SUBJECTIVE   Cough with white phlegm No respiratory distress  VITAL SIGNS:  Blood pressure 117/72, pulse 73, temperature 99.4 F (37.4 C), temperature source Oral, resp. rate 20, height 5' (1.524 m), weight 68.9 kg, SpO2 98 %.  I/O:  No intake or output data in the 24 hours ending 04/22/19 1118  PHYSICAL EXAMINATION:  GENERAL:  59 y.o.-year-old patient lying in the bed with no acute distress.  EYES: Pupils equal, round, reactive to light and accommodation. No scleral icterus.  HEENT: Head atraumatic, normocephalic. Oropharynx and nasopharynx clear.  NECK:  Supple, no jugular venous distention. No thyroid enlargement, no tenderness.  LUNGS: Coarse breath sounds bilaterally, scattered wheezing, no rales,rhonchi or crepitation. No use of accessory muscles of respiration.  CARDIOVASCULAR: S1, S2 normal. No murmurs, rubs, or gallops.  ABDOMEN: Soft, non-tender, non-distended. Bowel sounds present. No organomegaly or mass.  EXTREMITIES: No pedal edema, cyanosis, or clubbing.  NEUROLOGIC: Cranial nerves II through XII are intact. Muscle strength 5/5 in all extremities. Sensation intact. Gait not checked.  PSYCHIATRIC: patient is alert and oriented x 3.  SKIN: No obvious rash, lesion, or ulcer.   DATA REVIEW:   CBC  Recent Labs  Lab 04/21/19 2039  WBC 4.6  HGB 13.5  HCT 42.8  PLT 234    Chemistries  Recent Labs  Lab 04/21/19 2039  NA 138  K 4.0  CL  101  CO2 28  GLUCOSE 120*  BUN 10  CREATININE 0.89  CALCIUM 9.0  AST 20  ALT 13  ALKPHOS 81  BILITOT 0.7    Microbiology Results   Recent Results (from the past 240 hour(s))  Blood Culture (routine x 2)     Status: None (Preliminary result)   Collection Time: 04/22/19 12:40 AM   Specimen: BLOOD  Result Value Ref Range Status   Specimen Description BLOOD LEFT ANTECUBITAL  Final   Special Requests   Final    BOTTLES DRAWN AEROBIC AND ANAEROBIC Blood Culture adequate volume   Culture   Final    NO GROWTH < 12 HOURS Performed at [facility], [address], [place], [place] [address]    Report Status PENDING  Incomplete  Blood Culture (routine x 2)     Status: None (Preliminary result)   Collection Time: 04/22/19 12:40 AM   Specimen: BLOOD  Result Value Ref Range Status   Specimen Description BLOOD BLOOD RIGHT HAND  Final   Special Requests   Final    BOTTLES DRAWN AEROBIC AND ANAEROBIC Blood Culture adequate volume  Culture   Final    NO GROWTH < 12 HOURS Performed at [facility], [place], [place] [address]    Report Status PENDING  Incomplete  SARS CORONAVIRUS 2 (TAT 6-24 HRS) Nasopharyngeal Nasopharyngeal Swab     Status: Abnormal   Collection Time: 04/22/19 12:42 AM   Specimen: Nasopharyngeal Swab  Result Value Ref Range Status   SARS Coronavirus 2 POSITIVE (A) NEGATIVE Final    Comment: RESULT CALLED TO, READ BACK BY AND VERIFIED WITH: Torrie Mayers RN 10:45 04/22/19 ([email or web address]) (NOTE) SARS-CoV-2 target nucleic acids are DETECTED. The SARS-CoV-2 RNA is generally detectable in upper and lower respiratory specimens during the acute phase of infection. Positive results are indicative of the presence of SARS-CoV-2 RNA. Clinical correlation with patient history and other diagnostic information is  necessary to determine patient infection status. Positive results do not rule out bacterial infection or co-infection with other viruses.   The expected result is Negative. Fact Sheet for Patients: SugarRoll.be Fact Sheet for Healthcare Providers: https://www.woods-mathews.com/ This test is not yet approved or cleared by the [place] FDA and  has been authorized for detection and/or diagnosis of SARS-CoV-2 by FDA under an Emergency Use Authorization (EUA). This EUA will remain  in effect (meaning this test can be used) for th e duration of the COVID-19 declaration under Section 564(b)(1) of the Act, 21 U.S.C. section 360bbb-3(b)(1), unless the authorization is terminated or revoked sooner. Performed at [facility], [place] [address], [place], [place] [address]     RADIOLOGY:  DG Chest Port 1 View  Result Date: 04/21/2019 CLINICAL DATA:  Hypoxia EXAM: PORTABLE CHEST 1 VIEW COMPARISON:  09/14/2017 FINDINGS: Bilateral upper lobe predominant interstitial opacities, right greater than left. No pleural effusion or pneumothorax. Normal cardiomediastinal contours. IMPRESSION: Bilateral upper lobe predominant opacities, which may indicate multifocal infection. Electronically Signed   By: Ulyses Jarred M.D.   On: 04/21/2019 23:41     CODE STATUS:     Code Status Orders  (From admission, onward)         Start     Ordered   04/22/19 0140  Full code  Continuous     04/22/19 0139        Code Status History    This patient has a current code status but no historical code status.   Advance Care Planning Activity       TOTAL TIME TAKING CARE OF THIS PATIENT: *40* minutes.    Fritzi Mandes M.D  [facility]    CC: Primary care physician; Steele Sizer, MD

## 2019-04-22 NOTE — ED Notes (Addendum)
Pt given lunch tray and water. Pt provided with paper bag containing phone chargers that were dropped off by family

## 2019-04-22 NOTE — Progress Notes (Signed)
[facility] [place] NAME: Maria Richardson    MR#:  [id number]  DATE OF BIRTH:  08-08-1960  SUBJECTIVE:  Pt came in with increasing sob and cough with mild phlegm (whitish) No fever REVIEW OF SYSTEMS:   Review of Systems  Constitutional: Negative for chills, fever and weight loss.  HENT: Negative for ear discharge, ear pain and nosebleeds.   Eyes: Negative for blurred vision, pain and discharge.  Respiratory: Positive for cough and shortness of breath. Negative for sputum production, wheezing and stridor.   Cardiovascular: Negative for chest pain, palpitations, orthopnea and PND.  Gastrointestinal: Negative for abdominal pain, diarrhea, nausea and vomiting.  Genitourinary: Negative for frequency and urgency.  Musculoskeletal: Negative for back pain and joint pain.  Neurological: Negative for sensory change, speech change, focal weakness and weakness.  Psychiatric/Behavioral: Negative for depression and hallucinations. The patient is not nervous/anxious.    Tolerating Diet:yes Tolerating PT: not needed  DRUG ALLERGIES:   Allergies  Allergen Reactions  . Codeine Nausea And Vomiting    VITALS:  Blood pressure (!) 106/58, pulse (!) 112, temperature 99.4 F (37.4 C), temperature source Oral, resp. rate 20, height 5' (1.524 m), weight 68.9 kg, SpO2 92 %.  PHYSICAL EXAMINATION:   Physical Exam  GENERAL:  59 y.o.-year-old patient lying in the bed with no acute distress.  EYES: Pupils equal, round, reactive to light and accommodation. No scleral icterus.   HEENT: Head atraumatic, normocephalic. Oropharynx and nasopharynx clear.  NECK:  Supple, no jugular venous distention. No thyroid enlargement, no tenderness.  LUNGS:coarse breath sounds bilaterally, no wheezing, rales, rhonchi. No use of accessory muscles of respiration.  CARDIOVASCULAR: S1, S2 normal. No murmurs, rubs, or gallops.  ABDOMEN: Soft, nontender, nondistended. Bowel sounds  present. No organomegaly or mass.  EXTREMITIES: No cyanosis, clubbing or edema b/l.    NEUROLOGIC: Cranial nerves II through XII are intact. No focal Motor or sensory deficits b/l.   PSYCHIATRIC:  patient is alert and oriented x 3.  SKIN: No obvious rash, lesion, or ulcer.   LABORATORY PANEL:  CBC Recent Labs  Lab 04/21/19 2039  WBC 4.6  HGB 13.5  HCT 42.8  PLT 234    Chemistries  Recent Labs  Lab 04/21/19 2039  NA 138  K 4.0  CL 101  CO2 28  GLUCOSE 120*  BUN 10  CREATININE 0.89  CALCIUM 9.0  AST 20  ALT 13  ALKPHOS 81  BILITOT 0.7   Cardiac Enzymes No results for input(s): TROPONINI in the last 168 hours. RADIOLOGY:  DG Chest Port 1 View  Result Date: 04/21/2019 CLINICAL DATA:  Hypoxia EXAM: PORTABLE CHEST 1 VIEW COMPARISON:  09/14/2017 FINDINGS: Bilateral upper lobe predominant interstitial opacities, right greater than left. No pleural effusion or pneumothorax. Normal cardiomediastinal contours. IMPRESSION: Bilateral upper lobe predominant opacities, which may indicate multifocal infection. Electronically Signed   By: Ulyses Jarred M.D.   On: 04/21/2019 23:41   ASSESSMENT AND PLAN:  [name] a 59 y.o.femalewith medical history significant ofasthma and hypothyroidism who presents with concerns of hypoxia. Patient reports that she tested positive for Covid on 1/15 shortly after her husband was also positive. She has been having productive cough and last night just did not sleep well. Has been having intermittent dizziness. Hasdecreased appetite. Today her friend brought over a pulse ox and found that her oxygen saturation was in the 80 to 82% on room air and prompted her to present to the  ED.  #Acute hypoxic respiratory failure secondary to COVID pneumonia -On2L  -Maintain O2 >92%  -IV Decadron  -IV Remdesivir day 2/5 -Monitor inflammatory markers ---> CRP 6.6 -ferritin 438 -Cxr shows bilateral infiltrates -zinc and vitamin C - Procalcitonin  <0.10-- hold abxs  # Asthma -Not in acute exacerbation Continue bronchodilator  # Hypothyroidism Continue levothyroxine  DVT prophylaxis:.Lovenox Code Status: Full Family Communication: Plan discussed with patient at bedside  disposition Plan: transfer to [facility] and then Home   Consults called: none Admission status: inpatient  Procedures:none Family communication :pt Consults :none Discharge Disposition :home CODE STATUS: Full DVT Prophylaxis :lovenox  TOTAL TIME TAKING CARE OF THIS PATIENT: *30* minutes.  >50% time spent on counselling and coordination of care  Note: This dictation was prepared with Dragon dictation along with smaller phrase technology. Any transcriptional errors that result from this process are unintentional.  Fritzi Mandes M.D  [facility]   CC: Primary care physician; Steele Sizer, MDPatient ID: Maria Richardson, female   DOB: 1960/07/30, 59 y.o.   MRN: [id number]

## 2019-04-22 NOTE — ED Notes (Signed)
Jessica RN, aware of bed assigned  

## 2019-04-22 NOTE — ED Notes (Signed)
Pt given plastic bag of belongings that family dropped off. Pt's phone plugged into charger per request

## 2019-04-22 NOTE — ED Notes (Signed)
Pt given dinner tray.

## 2019-04-22 NOTE — ED Provider Notes (Signed)
[facility] Emergency Department Provider Note  ____________________________________________   First MD Initiated Contact with Patient 04/21/19 2356     (approximate)  I have reviewed the triage vital signs and the nursing notes.   HISTORY  Chief Complaint Shortness of Breath    HPI Maria Richardson is a 59 y.o. female with medical history as listed below which notably includes asthma although she says it is generally well controlled and she only uses albuterol once a day.  She does not have an oxygen requirement.  She presents tonight for evaluation of recent positive COVID-19 diagnosis and increasing shortness of breath.  She says that her husband helped some people move and then found out that those people were positive.  This was about a week and 1/2 to 2 weeks ago.  She started having symptoms at some point within the last few days but because of her baseline asthma and baseline cough she is not sure exactly when she became "symptomatic" from the COVID-19.  However she and her husband were tested at a [facility] about 3 days ago and they received their positive results yesterday.    She said that her breathing has gotten substantially worse over the last 24 hours.  Any amount of exertion makes her symptoms much worse and her cough with exertion has gotten severe.  She has a home pulse oximeter and her oxygen saturation has been in the low to mid 80s.  When she came to the emergency department her oxygen saturation was 89%.  She says she is better with rest, worse with exertion.  She denies fever/chills, sore throat, loss of smell or taste, chest pain, nausea, vomiting, abdominal pain, and dysuria.  She reports having a dry mouth and a loss of appetite.         Past Medical History:  Diagnosis Date  . Asthma   . Eczema   . Hypoglycemia   . Hypothyroidism   . Shortness of breath dyspnea    because of asthma  . Wears dentures    full lower    Patient  Active Problem List   Diagnosis Date Noted  . Pneumonia due to COVID-19 virus 04/22/2019  . Respiratory failure with hypoxia ([place]) 04/22/2019  . Asthma 04/22/2019  . Dyslipidemia 08/04/2015  . Benign neoplasm of sigmoid colon   . Seasonal allergic rhinitis 01/07/2015  . 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  . History of anemia 01/07/2015  . Excess weight 01/07/2015  . 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 Leiomyoma of uterus 01/07/2015  . Calculus of gallbladder 01/07/2015  . Acquired hypothyroidism 09/24/2009  . 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 Vitamin D deficiency 05/18/2009    Past Surgical History:  Procedure Laterality Date  . APPENDECTOMY    . COLONOSCOPY WITH PROPOFOL N/A 07/23/2015   Procedure: COLONOSCOPY WITH PROPOFOL;  Surgeon: Lucilla Lame, MD;  Location: [place];  Service: Endoscopy;  Laterality: N/A;  PLEASE LEAVE PT AT [place]     as teenager  . POLYPECTOMY  07/23/2015   Procedure: POLYPECTOMY;  Surgeon: Lucilla Lame, MD;  Location: [place];  Service: Endoscopy;;    Prior to Admission medications   Medication Sig Start Date End Date Taking? Authorizing Provider  Fluticasone-Umeclidin-Vilant (TRELEGY ELLIPTA) 100-62.5-25 MCG/INH AEPB Take 1 puff by mouth daily. 04/15/19  Yes Sowles, Drue Stager, MD  levothyroxine (SYNTHROID) 137 MCG tablet TAKE 1 TABLET(137 MCG) BY MOUTH DAILY BEFORE BREAKFAST 01/20/19  Yes Steele Sizer, MD  albuterol (PROVENTIL HFA;VENTOLIN HFA) 108 (90 Base) MCG/ACT inhaler Inhale 2 puffs into the lungs every 6 (six) hours as needed for wheezing or shortness of breath. Patient not taking: Reported on 12/21/2017 09/14/17   Steele Sizer, MD  calcipotriene (DOVONOX) 0.005 % cream APP AA BID 08/28/17   [provider]  fluorouracil (EFUDEX) 5 % cream Apply topically 2 (two) times daily. Patient not taking: Reported on 10/17/2018 07/27/17   Steele Sizer, MD  loratadine (CLARITIN) 10 MG tablet Take 1 tablet (10 mg  total) by mouth daily. Patient not taking: Reported on 12/21/2017 09/14/17   Steele Sizer, MD  meloxicam (MOBIC) 15 MG tablet Take 1 tablet (15 mg total) by mouth daily. Patient not taking: Reported on 04/22/2019 10/17/18   Steele Sizer, MD  triamcinolone (KENALOG) 0.025 % ointment Apply 1 application topically 2 (two) times daily. Patient not taking: Reported on 10/17/2018 08/02/17   Steele Sizer, MD  Vitamin D, Ergocalciferol, (DRISDOL) 1.25 MG (50000 UT) CAPS capsule Take 1 capsule (50,000 Units total) by mouth every 7 (seven) days. Patient not taking: Reported on 10/17/2018 02/21/18   Steele Sizer, MD    Allergies Codeine  Family History  Problem Relation Age of Onset  . Diabetes Father   . Hypertension Father   . Kidney disease Father   . Colon cancer Father     Social History Social History   Tobacco Use  . Smoking status: Former Smoker    Packs/day: 0.50    Years: 30.00    Pack years: 15.00    Types: Cigarettes    Quit date: 04/28/1999    Years since quitting: 19.9  . Smokeless tobacco: Never Used  Substance Use Topics  . Alcohol use: No    Alcohol/week: 0.0 standard drinks  . Drug use: No    Review of Systems Constitutional: No fever/chills Eyes: No visual changes. ENT: No sore throat. + Dry mouth Cardiovascular: Denies chest pain. Respiratory: Worsening shortness of breath and cough. Gastrointestinal: No abdominal pain.  No nausea, no vomiting.  No diarrhea.  No constipation.  Loss of appetite. Genitourinary: Negative for dysuria. Musculoskeletal: Negative for neck pain.  Negative for back pain. Integumentary: Negative for rash. Neurological: Negative for headaches, focal weakness or numbness.   ____________________________________________   PHYSICAL EXAM:  VITAL SIGNS: ED Triage Vitals  Enc Vitals Group     BP 04/21/19 2035 117/68     Pulse Rate 04/21/19 2035 93     Resp 04/21/19 2035 (!) 22     Temp 04/21/19 2035 99.4 F (37.4 C)     Temp  Source 04/21/19 2035 Oral     SpO2 04/21/19 2034 (!) 89 %     Weight 04/21/19 2036 68.9 kg (152 lb)     Height 04/21/19 2036 1.524 m (5')     Head Circumference --      Peak Flow --      Pain Score 04/21/19 2035 0     Pain Loc --      Pain Edu? --      Excl. in [place]? --     Constitutional: Alert and oriented.  Eyes: Conjunctivae are normal.  Head: Atraumatic. Nose: No congestion/rhinnorhea. Mouth/Throat: Patient is wearing a mask. Neck: No stridor.  No meningeal signs.   Cardiovascular: Normal rate, regular rhythm. Good peripheral circulation. Grossly normal heart sounds. Respiratory: Normal respiratory effort.  No retractions.  Patient is currently on 2 L of oxygen by nasal cannula and has an oxygen saturation in the  upper 90s. Gastrointestinal: Soft and nontender. No distention.  Musculoskeletal: No lower extremity tenderness nor edema. No gross deformities of extremities. Neurologic:  Normal speech and language. No gross focal neurologic deficits are appreciated.  Skin:  Skin is warm, dry and intact. Psychiatric: Mood and affect are normal. Speech and behavior are normal.  ____________________________________________   LABS (all labs ordered are listed, but only abnormal results are displayed)  Labs Reviewed  CBC WITH DIFFERENTIAL/PLATELET - Abnormal; Notable for the following components:      Result Value   RDW 15.6 (*)    All other components within normal limits  COMPREHENSIVE METABOLIC PANEL - Abnormal; Notable for the following components:   Glucose, Bld 120 (*)    All other components within normal limits  FIBRIN DERIVATIVES D-DIMER ([facility] ONLY) - Abnormal; Notable for the following components:   Fibrin derivatives D-dimer ([facility]) 1,195.64 (*)    All other components within normal limits  LACTATE DEHYDROGENASE - Abnormal; Notable for the following components:   LDH 221 (*)    All other components within normal limits  FERRITIN - Abnormal; Notable for the following  components:   Ferritin 438 (*)    All other components within normal limits  FIBRINOGEN - Abnormal; Notable for the following components:   Fibrinogen 562 (*)    All other components within normal limits  CULTURE, BLOOD (ROUTINE X 2)  CULTURE, BLOOD (ROUTINE X 2)  SARS CORONAVIRUS 2 (TAT 6-24 HRS)  LACTIC ACID, PLASMA  PROCALCITONIN  TRIGLYCERIDES  BRAIN NATRIURETIC PEPTIDE  C-REACTIVE PROTEIN  HIV ANTIBODY (ROUTINE TESTING W REFLEX)  POC SARS CORONAVIRUS 2 AG -  ED  ABO/RH  TROPONIN I (HIGH SENSITIVITY)   ____________________________________________  EKG  ED ECG REPORT I, Hinda Kehr, the attending physician, personally viewed and interpreted this ECG.  Date: 04/21/2019 EKG Time: 20: 41 Rate: 90 Rhythm: normal sinus rhythm QRS Axis: normal Intervals: normal ST/T Wave abnormalities: normal Narrative Interpretation: no evidence of acute ischemia  ____________________________________________  RADIOLOGY I, Hinda Kehr, personally viewed and evaluated these images (plain radiographs) as part of my medical decision making, as well as reviewing the written report by the radiologist.  ED MD interpretation: Patchy multifocal pneumonia consistent with COVID-19 infection  Official radiology report(s): DG Chest Port 1 View  Result Date: 04/21/2019 CLINICAL DATA:  Hypoxia EXAM: PORTABLE CHEST 1 VIEW COMPARISON:  09/14/2017 FINDINGS: Bilateral upper lobe predominant interstitial opacities, right greater than left. No pleural effusion or pneumothorax. Normal cardiomediastinal contours. IMPRESSION: Bilateral upper lobe predominant opacities, which may indicate multifocal infection. Electronically Signed   By: Ulyses Jarred M.D.   On: 04/21/2019 23:41    ____________________________________________   PROCEDURES   Procedure(s) performed (including Critical Care):  .Critical Care Performed by: Hinda Kehr, MD Authorized by: Hinda Kehr, MD   Critical care provider  statement:    Critical care time (minutes):  30   Critical care time was exclusive of:  Separately billable procedures and treating other patients   Critical care was necessary to treat or prevent imminent or life-threatening deterioration of the following conditions:  Respiratory failure (COVID-19 with oxygen requirement)   Critical care was time spent personally by me on the following activities:  Development of treatment plan with patient or surrogate, discussions with consultants, evaluation of patient's response to treatment, examination of patient, obtaining history from patient or surrogate, ordering and performing treatments and interventions, ordering and review of laboratory studies, ordering and review of radiographic studies, pulse oximetry, re-evaluation of patient's condition  and review of old charts     ____________________________________________   INITIAL IMPRESSION / MDM / [place] / ED COURSE  As part of my medical decision making, I reviewed the following data within the [place] notes reviewed and incorporated, Labs reviewed , EKG interpreted , Old chart reviewed, Radiograph reviewed , Discussed with admitting physician  and Notes from prior ED visits   Differential diagnosis includes, but is not limited to, COVID-19 lower respiratory infection, community-acquired pneumonia, other nonspecific viral infection, pulmonary embolism, ACS.  The patient gives an excellent history and was positive as an outpatient for COVID-19 as of the last couple of days.  However we cannot see the results in the record and she understands we need to retest her tonight.  However the clinical correlation is very strong and consistent with COVID-19 respiratory infection.  I have ordered remdesivir per pharmacy consult and Decadron 10 mg IV.  She has an oxygen requirement and needs admission.  She agrees with the plan.  Her basic labs are within normal limits and I  have added on the "Covid labs".  Patient is comfortable with the plan for admission and I will consult the hospitalist for admission.      Clinical Course as of Apr 21 246  Tue Apr 22, 2019  0045 Discussed with Dr. Flossie Buffy who will admit   [CF]  0248 Procalcitonin: <0.10 [CF]  0248 Most labs are consistent with COVID-19 infection , such as the elevated D-dimer, fibrinogen, LDH, ferritin, etc.  The 15-minute rapid swab was negative but this could be because she has actually been infected for longer than she realizes and the test is not particularly sensitive after a week.  The 6 to 24-hour swab is pending.   [CF]    Clinical Course User Index [CF] Hinda Kehr, MD     ____________________________________________  FINAL CLINICAL IMPRESSION(S) / ED DIAGNOSES  Final diagnoses:  Acute respiratory failure with hypoxemia ([place])  Lower respiratory tract infection due to COVID-19 virus     MEDICATIONS GIVEN DURING THIS VISIT:  Medications  remdesivir 200 mg in sodium chloride 0.9% 250 mL IVPB (200 mg Intravenous New Bag/Given 04/22/19 0156)    Followed by  remdesivir 100 mg in sodium chloride 0.9 % 100 mL IVPB (has no administration in time range)  enoxaparin (LOVENOX) injection 40 mg (has no administration in time range)  dexamethasone (DECADRON) injection 6 mg (has no administration in time range)  levothyroxine (SYNTHROID) tablet 137 mcg (has no administration in time range)  Fluticasone-Umeclidin-Vilant 100-62.5-25 MCG/INH AEPB 1 puff (has no administration in time range)  dexamethasone (DECADRON) injection 10 mg (10 mg Intravenous Given 04/22/19 0052)     ED Discharge Orders    None      *Please note:  TRANISE TUAZON was evaluated in Emergency Department on 04/22/2019 for the symptoms described in the history of present illness. She was evaluated in the context of the global COVID-19 pandemic, which necessitated consideration that the patient might be at risk for infection with  the SARS-CoV-2 virus that causes COVID-19. Institutional protocols and algorithms that pertain to the evaluation of patients at risk for COVID-19 are in a state of rapid change based on information released by regulatory bodies including the CDC and federal and state organizations. These policies and algorithms were followed during the patient's care in the ED.  Some ED evaluations and interventions may be delayed as a result of limited staffing during the pandemic.*  Note:  This document was prepared using Dragon voice recognition software and may include unintentional dictation errors.   Hinda Kehr, MD 04/22/19 [phone]

## 2019-04-22 NOTE — H&P (Signed)
History and Physical    Maria Richardson [id number] DOB: 1960/06/17 DOA: 04/21/2019  PCP: Steele Sizer, MD  Patient coming from: Home, lives with husband I have personally briefly reviewed patient's old medical records in [place]  Chief Complaint: Hypoxia  HPI: Maria Richardson is a 59 y.o. female with medical history significant of asthma and hypothyroidism who presents with concerns of hypoxia. Patient reports that she tested positive for Covid on 1/15 shortly after her husband was also positive.  She has been having productive cough and last night just did not sleep well.  Has been having intermittent dizziness. Has decreased appetite.  Today her friend brought over a pulse ox and found that her oxygen saturation was in the 80 to 82% on room air and prompted her to present to the ED. She denies any chest pain.  No nausea, vomiting or diarrhea abdominal pain.  Patient was a former tobacco user but quit 25 years ago.  Continues to get secondhand smoke from husband.  Denies any alcohol or illicit drug use.  ED Course: She had temperature of 99.4 but otherwise stable vitals and was found to have oxygen saturation 89% on room air that improved to 96% on 2 L via nasal cannula. WBC of 4.6, glucose of 120, procalcitonin of less than 0.10.  Chest x-ray shows bilateral upper lobe opacity indicating multifocal infection.  Review of Systems:  Constitutional: No Weight Change, No Fever ENT/Mouth: No sore throat, No Rhinorrhea Eyes: No Vision Changes Cardiovascular: No Chest Pain, + SOB Respiratory: + Cough, + Sputum Gastrointestinal: No Nausea, No Vomiting, No Diarrhea, No Constipation, No Pain Genitourinary: no Urinary Incontinence Musculoskeletal: No Arthralgias, No Myalgias Skin: No Skin Lesions, Neuro: no Weakness, No Numbness Psych:  + decrease appetite Heme/Lymph: No Bruising, No Bleeding  Past Medical History:  Diagnosis Date  . Asthma   . Eczema   . Hypoglycemia     . Hypothyroidism   . Shortness of breath dyspnea    because of asthma  . Wears dentures    full lower    Past Surgical History:  Procedure Laterality Date  . APPENDECTOMY    . COLONOSCOPY WITH PROPOFOL N/A 07/23/2015   Procedure: COLONOSCOPY WITH PROPOFOL;  Surgeon: Lucilla Lame, MD;  Location: [facility];  Service: Endoscopy;  Laterality: N/A;  PLEASE LEAVE PT AT [place]     as teenager  . POLYPECTOMY  07/23/2015   Procedure: POLYPECTOMY;  Surgeon: Lucilla Lame, MD;  Location: [facility];  Service: Endoscopy;;     reports that she quit smoking about 19 years ago. Her smoking use included cigarettes. She has a 15.00 pack-year smoking history. She has never used smokeless tobacco. She reports that she does not drink alcohol or use drugs.  Allergies  Allergen Reactions  . Codeine Nausea And Vomiting    Family History  Problem Relation Age of Onset  . Diabetes Father   . Hypertension Father   . Kidney disease Father   . Colon cancer Father      Prior to Admission medications   Medication Sig Start Date End Date Taking? Authorizing Provider  Fluticasone-Umeclidin-Vilant (TRELEGY ELLIPTA) 100-62.5-25 MCG/INH AEPB Take 1 puff by mouth daily. 04/15/19  Yes Sowles, Drue Stager, MD  levothyroxine (SYNTHROID) 137 MCG tablet TAKE 1 TABLET(137 MCG) BY MOUTH DAILY BEFORE BREAKFAST 01/20/19  Yes Sowles, Drue Stager, MD  albuterol (PROVENTIL HFA;VENTOLIN HFA) 108 (90 Base) MCG/ACT inhaler Inhale 2 puffs into the lungs every 6 (  six) hours as needed for wheezing or shortness of breath. Patient not taking: Reported on 12/21/2017 09/14/17   Steele Sizer, MD  calcipotriene (DOVONOX) 0.005 % cream APP AA BID 08/28/17   [provider]  fluorouracil (EFUDEX) 5 % cream Apply topically 2 (two) times daily. Patient not taking: Reported on 10/17/2018 07/27/17   Steele Sizer, MD  loratadine (CLARITIN) 10 MG tablet Take 1 tablet (10 mg total) by mouth  daily. Patient not taking: Reported on 12/21/2017 09/14/17   Steele Sizer, MD  meloxicam (MOBIC) 15 MG tablet Take 1 tablet (15 mg total) by mouth daily. Patient not taking: Reported on 04/22/2019 10/17/18   Steele Sizer, MD  triamcinolone (KENALOG) 0.025 % ointment Apply 1 application topically 2 (two) times daily. Patient not taking: Reported on 10/17/2018 08/02/17   Steele Sizer, MD  Vitamin D, Ergocalciferol, (DRISDOL) 1.25 MG (50000 UT) CAPS capsule Take 1 capsule (50,000 Units total) by mouth every 7 (seven) days. Patient not taking: Reported on 10/17/2018 02/21/18   Steele Sizer, MD    Physical Exam: Vitals:   04/21/19 2353 04/21/19 2354 04/21/19 2355 04/22/19 0030  BP:    109/70  Pulse: 81     Resp: 13 15 17 16   Temp:      TempSrc:      SpO2: 97%     Weight:      Height:        Constitutional: NAD, calm, comfortable, nontoxic female younger than her stated age laying flat in bed Vitals:   04/21/19 2353 04/21/19 2354 04/21/19 2355 04/22/19 0030  BP:    109/70  Pulse: 81     Resp: 13 15 17 16   Temp:      TempSrc:      SpO2: 97%     Weight:      Height:       Eyes: PERRL, lids and conjunctivae normal ENMT: Mucous membranes are moist.  Neck: normal, supple Respiratory: bibasilar crackles on 2L via [place]. Normal respiratory effort. No accessory muscle use.  Cardiovascular: Regular rate and rhythm, no murmurs / rubs / gallops. No extremity edema. Abdomen: no tenderness, no masses palpated. Bowel sounds positive.  Musculoskeletal: no clubbing / cyanosis. No joint deformity upper and lower extremities. Good ROM, no contractures. Normal muscle tone.  Skin: no rashes, lesions, ulcers. No induration Neurologic: CN 2-12 grossly intact. Sensation intact. Strength 5/5 in all 4.  Psychiatric: Normal judgment and insight. Alert and oriented x 3. Normal mood.     Labs on Admission: I have personally reviewed following labs and imaging studies  CBC: Recent Labs  Lab  04/21/19 2039  WBC 4.6  NEUTROABS 2.8  HGB 13.5  HCT 42.8  MCV 90.7  PLT [id number]   Basic Metabolic Panel: Recent Labs  Lab 04/21/19 2039  NA 138  K 4.0  CL 101  CO2 28  GLUCOSE 120*  BUN 10  CREATININE 0.89  CALCIUM 9.0   GFR: Estimated Creatinine Clearance: 60.4 mL/min (by C-G formula based on SCr of 0.89 mg/dL). Liver Function Tests: Recent Labs  Lab 04/21/19 2039  AST 20  ALT 13  ALKPHOS 81  BILITOT 0.7  PROT 7.6  ALBUMIN 3.5   No results for input(s): LIPASE, AMYLASE in the last 168 hours. No results for input(s): AMMONIA in the last 168 hours. Coagulation Profile: No results for input(s): INR, PROTIME in the last 168 hours. Cardiac Enzymes: No results for input(s): CKTOTAL, CKMB, CKMBINDEX, TROPONINI in the last 168 hours. BNP (  last 3 results) No results for input(s): PROBNP in the last 8760 hours. HbA1C: No results for input(s): HGBA1C in the last 72 hours. CBG: No results for input(s): GLUCAP in the last 168 hours. Lipid Profile: Recent Labs    04/21/19 2039  TRIG 66   Thyroid Function Tests: No results for input(s): TSH, T4TOTAL, FREET4, T3FREE, THYROIDAB in the last 72 hours. Anemia Panel: Recent Labs    04/21/19 2039  FERRITIN 438*   Urine analysis:    Component Value Date/Time   COLORURINE YELLOW (A) 06/13/2016 1740   APPEARANCEUR HAZY (A) 06/13/2016 1740   LABSPEC 1.027 06/13/2016 1740   PHURINE 5.0 06/13/2016 1740   GLUCOSEU NEGATIVE 06/13/2016 1740   HGBUR MODERATE (A) 06/13/2016 1740   BILIRUBINUR NEGATIVE 06/13/2016 1740   KETONESUR 5 (A) 06/13/2016 1740   PROTEINUR 100 (A) 06/13/2016 1740   NITRITE NEGATIVE 06/13/2016 1740   LEUKOCYTESUR NEGATIVE 06/13/2016 1740    Radiological Exams on Admission: DG Chest Port 1 View  Result Date: 04/21/2019 CLINICAL DATA:  Hypoxia EXAM: PORTABLE CHEST 1 VIEW COMPARISON:  09/14/2017 FINDINGS: Bilateral upper lobe predominant interstitial opacities, right greater than left. No pleural  effusion or pneumothorax. Normal cardiomediastinal contours. IMPRESSION: Bilateral upper lobe predominant opacities, which may indicate multifocal infection. Electronically Signed   By: Ulyses Jarred M.D.   On: 04/21/2019 23:41    EKG: Independently reviewed.   Assessment/Plan Acute hypoxic respiratory failure secondary to COVID pneumonia On 2L  Maintain O2 > 92%  IV Decadron  Remdesivir Monitor inflammatory markers  Asthma Not in acute exacerbation Continue bronchodilator  Hypothyroidism Continue levothyroxine  DVT prophylaxis:.Lovenox Code Status: Full Family Communication: Plan discussed with patient at bedside  disposition Plan: Home with at least 2 midnight stays  Consults called:  Admission status: inpatient  Teyah Rossy T Vikash Nest DO [facility]   If 7PM-7AM, please contact night-coverage [email or web address]   04/22/2019, 2:36 AM

## 2019-04-22 NOTE — ED Notes (Signed)
Report given to Candice, RN.

## 2019-04-22 NOTE — ED Notes (Signed)
Admitting physician in with patient.  

## 2019-04-22 NOTE — Progress Notes (Signed)
Remdesivir - Pharmacy Brief Note   O:  ALT: 13 CXR: evidence of infection SpO2: 97% on [place]   A/P:  Remdesivir 200 mg IVPB once followed by 100 mg IVPB daily x 4 days.   Hart Robinsons, PharmD Clinical Pharmacist   04/22/2019 12:55 AM

## 2019-04-22 NOTE — Telephone Encounter (Signed)
Requested Prescriptions  Pending Prescriptions Disp Refills  . levothyroxine (SYNTHROID) 137 MCG tablet [Pharmacy Med Name: LEVOTHYROXINE 0.137MG  (137MCG) TAB] 90 tablet 0    Sig: TAKE 1 TABLET(137 MCG) BY MOUTH DAILY BEFORE BREAKFAST     Endocrinology:  Hypothyroid Agents Failed - 04/22/2019  1:34 PM      Failed - TSH needs to be rechecked within 3 months after an abnormal result. Refill until TSH is due.      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

## 2019-04-23 DIAGNOSIS — I959 Hypotension, unspecified: Secondary | ICD-10-CM

## 2019-04-23 LAB — CBC WITH DIFFERENTIAL/PLATELET
Abs Immature Granulocytes: 0.05 10*3/uL (ref 0.00–0.07)
Basophils Absolute: 0 10*3/uL (ref 0.0–0.1)
Basophils Relative: 0 %
Eosinophils Absolute: 0 10*3/uL (ref 0.0–0.5)
Eosinophils Relative: 0 %
HCT: 41.2 % (ref 36.0–46.0)
Hemoglobin: 13 g/dL (ref 12.0–15.0)
Immature Granulocytes: 1 %
Lymphocytes Relative: 17 %
Lymphs Abs: 1.3 10*3/uL (ref 0.7–4.0)
MCH: 28.5 pg (ref 26.0–34.0)
MCHC: 31.6 g/dL (ref 30.0–36.0)
MCV: 90.4 fL (ref 80.0–100.0)
Monocytes Absolute: 0.7 10*3/uL (ref 0.1–1.0)
Monocytes Relative: 10 %
Neutro Abs: 5.5 10*3/uL (ref 1.7–7.7)
Neutrophils Relative %: 72 %
Platelets: 249 10*3/uL (ref 150–400)
RBC: 4.56 MIL/uL (ref 3.87–5.11)
RDW: 15.4 % (ref 11.5–15.5)
WBC: 7.6 10*3/uL (ref 4.0–10.5)
nRBC: 0 % (ref 0.0–0.2)

## 2019-04-23 LAB — COMPREHENSIVE METABOLIC PANEL
ALT: 13 U/L (ref 0–44)
AST: 17 U/L (ref 15–41)
Albumin: 3 g/dL — ABNORMAL LOW (ref 3.5–5.0)
Alkaline Phosphatase: 69 U/L (ref 38–126)
Anion gap: 5 (ref 5–15)
BUN: 18 mg/dL (ref 6–20)
CO2: 32 mmol/L (ref 22–32)
Calcium: 8.9 mg/dL (ref 8.9–10.3)
Chloride: 102 mmol/L (ref 98–111)
Creatinine, Ser: 0.82 mg/dL (ref 0.44–1.00)
GFR calc Af Amer: >60 mL/min (ref >60)
GFR calc non Af Amer: >60 mL/min (ref >60)
Glucose, Bld: 134 mg/dL — ABNORMAL HIGH (ref 70–99)
Potassium: 4.2 mmol/L (ref 3.5–5.1)
Sodium: 139 mmol/L (ref 135–145)
Total Bilirubin: 0.4 mg/dL (ref 0.3–1.2)
Total Protein: 6.6 g/dL (ref 6.5–8.1)

## 2019-04-23 LAB — FIBRIN DERIVATIVES D-DIMER (ARMC ONLY): Fibrin derivatives D-dimer (ARMC): 1394.06 ng/mL (FEU) — ABNORMAL HIGH (ref 0.00–499.00)

## 2019-04-23 LAB — C-REACTIVE PROTEIN: CRP: 4.5 mg/dL — ABNORMAL HIGH (ref <1.0)

## 2019-04-23 LAB — FERRITIN: Ferritin: 398 ng/mL — ABNORMAL HIGH (ref 11–307)

## 2019-04-23 MED ORDER — ZINC SULFATE 220 (50 ZN) MG PO CAPS
220.0000 mg | ORAL_CAPSULE | Freq: Every day | ORAL | Status: DC
  Administered 2019-04-23 – 2019-04-24 (×2): 220 mg via ORAL
  Filled 2019-04-23 (×2): qty 1

## 2019-04-23 MED ORDER — ASCORBIC ACID 500 MG PO TABS
500.0000 mg | ORAL_TABLET | Freq: Every day | ORAL | Status: DC
  Administered 2019-04-23 – 2019-04-24 (×2): 500 mg via ORAL
  Filled 2019-04-23 (×2): qty 1

## 2019-04-23 MED ORDER — ORAL CARE MOUTH RINSE
15.0000 mL | Freq: Two times a day (BID) | OROMUCOSAL | Status: DC
  Administered 2019-04-23: 10:00:00 15 mL via OROMUCOSAL

## 2019-04-23 MED ORDER — SODIUM CHLORIDE 0.9 % IV SOLN
INTRAVENOUS | Status: DC | PRN
  Administered 2019-04-23: 25 mL via INTRAVENOUS

## 2019-04-23 MED ORDER — SODIUM CHLORIDE 0.9 % IV SOLN: INTRAVENOUS | Status: AC

## 2019-04-23 MED ORDER — CHOLECALCIFEROL 10 MCG (400 UNIT) PO TABS
400.0000 [IU] | ORAL_TABLET | Freq: Every day | ORAL | Status: DC
  Administered 2019-04-23: 400 [IU] via ORAL
  Filled 2019-04-23 (×2): qty 1

## 2019-04-23 NOTE — Progress Notes (Signed)
   04/23/19 1537  Vitals  Temp 98.5 F (36.9 C)  Temp Source Oral  BP (!) 97/52  MAP (mmHg) 67  BP Location Left Arm  BP Method Automatic  Patient Position (if appropriate) Lying  Pulse Rate 72  Resp 17  Oxygen Therapy  SpO2 91 %  O2 Device Room Air  MEWS Score  MEWS Temp 0  MEWS Systolic 1  MEWS Pulse 0  MEWS RR 0  MEWS LOC 0  MEWS Score 1  MEWS Score Color Green  Made Dr. Clementeen Graham aware of patient's current BP and that she is feeling lethargic.  New order for IV fluids.  Clarise Cruz, BSN

## 2019-04-23 NOTE — Progress Notes (Signed)
PROGRESS NOTE                                                                                                                                                                                                             Patient Demographics:    Maria Richardson, is a 59 y.o. female, DOB - 08/18/1960, [id number]  Admit date - 04/21/2019   Admitting Physician Fritzi Mandes, MD  Outpatient Primary MD for the patient is Steele Sizer, MD  LOS - 1    Chief Complaint  Patient presents with  . Shortness of Breath       Brief Narrative 59 year old female with asthma, hypothyroidism presenting with dizziness, poor p.o. intake.  She was tested positive for COVID-19 on 1/15 shortly after her husband was tested positive as well.  Her friend brought over pulse ox at home and found her O2 sat to be in the low 80s. Patient found to have elevated inflammatory markers with chest x-ray showing bilateral infiltrate.  Admitted for acute respiratory failure with hypoxia secondary to COVID-19 pneumonia.   Subjective:    on 2L. Reports breathing better but feeling very weak. C/o some upper back pain   Assessment  & Plan :    Principal Problem: Acute respiratory failure with hypoxia secondary to COVID-19 pneumonia Currently on 2 L via nasal cannula, wean as tolerated. Airborne and contact precautions.  Continue IV remdesivir (day 3/5) and IV Decadron (day 3/10) Noted for worsened fibrinogen elevated D-dimer but improved CRP.  Given improving respiratory function does not need Actemra. Continue vitamin C and zinc.  Albuterol inhaler as needed  May DC home tomorrow with outpatient remdesivir infusion if sats stable and clinically improved  Active Problems:   Acquired hypothyroidism Continue Synthroid  Hypotension Patient feeling dizzy and lightheaded.  Will add IV fluids.      Code Status : Full code  Family Communication  :  None  Disposition Plan  : Home possibly tomorrow if respiratory function improved  Barriers For Discharge : Active symptoms  Consults  : None  Procedures  : None  DVT Prophylaxis  :  Lovenox -   Lab Results  Component Value Date   PLT 249 04/23/2019    Antibiotics  :    Anti-infectives (From admission, onward)   Start     Dose/Rate Route Frequency Ordered  Stop   04/23/19 1000  remdesivir 100 mg in sodium chloride 0.9 % 100 mL IVPB     100 mg 200 mL/hr over 30 Minutes Intravenous Daily 04/22/19 0055 04/27/19 0959   04/22/19 0200  remdesivir 200 mg in sodium chloride 0.9% 250 mL IVPB     200 mg 580 mL/hr over 30 Minutes Intravenous Once 04/22/19 0055 04/22/19 0318        Objective:   Vitals:   04/22/19 1730 04/22/19 2022 04/22/19 2316 04/23/19 0808  BP: 108/71 112/69 110/78 111/61  Pulse: 68 64 62 69  Resp:  18 18 18   Temp:  98 F (36.7 C) 98.5 F (36.9 C) 98.2 F (36.8 C)  TempSrc:    Oral  SpO2: 97% 99% 98% 97%  Weight:      Height:        Wt Readings from Last 3 Encounters:  04/21/19 68.9 kg  02/21/18 72 kg  12/21/17 72.5 kg    No intake or output data in the 24 hours ending 04/23/19 0927   Physical Exam  Gen: not in distress, fatigued HEENT:  moist mucosa, supple neck Chest: clear b/l, no added sounds CVS: N S1&S2, no murmurs,  GI: soft, NT, ND,  Musculoskeletal: warm, no edema     Data Review:    CBC Recent Labs  Lab 04/21/19 2039 04/23/19 0354  WBC 4.6 7.6  HGB 13.5 13.0  HCT 42.8 41.2  PLT 234 249  MCV 90.7 90.4  MCH 28.6 28.5  MCHC 31.5 31.6  RDW 15.6* 15.4  LYMPHSABS 1.1 1.3  MONOABS 0.4 0.7  EOSABS 0.2 0.0  BASOSABS 0.0 0.0    Chemistries  Recent Labs  Lab 04/21/19 2039 04/23/19 0354  NA 138 139  K 4.0 4.2  CL 101 102  CO2 28 32  GLUCOSE 120* 134*  BUN 10 18  CREATININE 0.89 0.82  CALCIUM 9.0 8.9  AST 20 17  ALT 13 13  ALKPHOS 81 69  BILITOT 0.7 0.4    ------------------------------------------------------------------------------------------------------------------ Recent Labs    04/21/19 2039  TRIG 66    Lab Results  Component Value Date   HGBA1C 6.1 (H) 10/17/2018   ------------------------------------------------------------------------------------------------------------------ No results for input(s): TSH, T4TOTAL, T3FREE, THYROIDAB in the last 72 hours.  Invalid input(s): FREET3 ------------------------------------------------------------------------------------------------------------------ Recent Labs    04/21/19 2039 04/23/19 0354  FERRITIN 438* 398*    Coagulation profile No results for input(s): INR, PROTIME in the last 168 hours.  No results for input(s): DDIMER in the last 72 hours.  Cardiac Enzymes No results for input(s): CKMB, TROPONINI, MYOGLOBIN in the last 168 hours.  Invalid input(s): CK ------------------------------------------------------------------------------------------------------------------    Component Value Date/Time   BNP 22.0 04/21/2019 2039    Inpatient Medications  Scheduled Meds: . dexamethasone (DECADRON) injection  6 mg Intravenous Q24H  . enoxaparin (LOVENOX) injection  40 mg Subcutaneous Q24H  . fluticasone furoate-vilanterol  1 puff Inhalation Daily   And  . umeclidinium bromide  1 puff Inhalation Daily  . levothyroxine  137 mcg Oral Q0600  . mouth rinse  15 mL Mouth Rinse BID   Continuous Infusions: . sodium chloride 25 mL (04/23/19 0927)  . remdesivir 100 mg in NS 100 mL     PRN Meds:.sodium chloride  Micro Results Recent Results (from the past 240 hour(s))  Blood Culture (routine x 2)     Status: None (Preliminary result)   Collection Time: 04/22/19 12:40 AM   Specimen: BLOOD  Result Value Ref Range Status  Specimen Description BLOOD LEFT ANTECUBITAL  Final   Special Requests   Final    BOTTLES DRAWN AEROBIC AND ANAEROBIC Blood Culture adequate volume    Culture   Final    NO GROWTH 1 DAY Performed at [facility], [address] [place], [place] [address]    Report Status PENDING  Incomplete  Blood Culture (routine x 2)     Status: None (Preliminary result)   Collection Time: 04/22/19 12:40 AM   Specimen: BLOOD  Result Value Ref Range Status   Specimen Description BLOOD BLOOD RIGHT HAND  Final   Special Requests   Final    BOTTLES DRAWN AEROBIC AND ANAEROBIC Blood Culture adequate volume   Culture   Final    NO GROWTH 1 DAY Performed at [facility], [address] [place], [place] [address]    Report Status PENDING  Incomplete  SARS CORONAVIRUS 2 (TAT 6-24 HRS) Nasopharyngeal Nasopharyngeal Swab     Status: Abnormal   Collection Time: 04/22/19 12:42 AM   Specimen: Nasopharyngeal Swab  Result Value Ref Range Status   SARS Coronavirus 2 POSITIVE (A) NEGATIVE Final    Comment: RESULT CALLED TO, READ BACK BY AND VERIFIED WITH: Torrie Mayers RN 10:45 04/22/19 ([email or web address]) (NOTE) SARS-CoV-2 target nucleic acids are DETECTED. The SARS-CoV-2 RNA is generally detectable in upper and lower respiratory specimens during the acute phase of infection. Positive results are indicative of the presence of SARS-CoV-2 RNA. Clinical correlation with patient history and other diagnostic information is  necessary to determine patient infection status. Positive results do not rule out bacterial infection or co-infection with other viruses.  The expected result is Negative. Fact Sheet for Patients: SugarRoll.be Fact Sheet for Healthcare Providers: https://www.woods-mathews.com/ This test is not yet approved or cleared by the [place] FDA and  has been authorized for detection and/or diagnosis of SARS-CoV-2 by FDA under an Emergency Use Authorization (EUA). This EUA will remain  in effect (meaning this test can be used) for th e duration of the COVID-19 declaration under Section 564(b)(1) of  the Act, 21 U.S.C. section 360bbb-3(b)(1), unless the authorization is terminated or revoked sooner. Performed at [facility], [place] [address], [place], [place] [address]     Radiology Reports DG Chest [place] 1 View  Result Date: 04/21/2019 CLINICAL DATA:  Hypoxia EXAM: PORTABLE CHEST 1 VIEW COMPARISON:  09/14/2017 FINDINGS: Bilateral upper lobe predominant interstitial opacities, right greater than left. No pleural effusion or pneumothorax. Normal cardiomediastinal contours. IMPRESSION: Bilateral upper lobe predominant opacities, which may indicate multifocal infection. Electronically Signed   By: Ulyses Jarred M.D.   On: 04/21/2019 23:41    Time Spent in minutes  35   Kailand Seda M.D on 04/23/2019 at 9:27 AM  Between 7am to 7pm - Pager - [phone]  After 7pm go to [email or web address] - password [facility]  [facility] -  Office  [phone]

## 2019-04-24 DIAGNOSIS — I9589 Other hypotension: Secondary | ICD-10-CM

## 2019-04-24 DIAGNOSIS — E861 Hypovolemia: Secondary | ICD-10-CM

## 2019-04-24 DIAGNOSIS — J22 Unspecified acute lower respiratory infection: Secondary | ICD-10-CM

## 2019-04-24 LAB — COMPREHENSIVE METABOLIC PANEL
ALT: 15 U/L (ref 0–44)
AST: 24 U/L (ref 15–41)
Albumin: 3.1 g/dL — ABNORMAL LOW (ref 3.5–5.0)
Alkaline Phosphatase: 81 U/L (ref 38–126)
Anion gap: 10 (ref 5–15)
BUN: 17 mg/dL (ref 6–20)
CO2: 25 mmol/L (ref 22–32)
Calcium: 8.5 mg/dL — ABNORMAL LOW (ref 8.9–10.3)
Chloride: 108 mmol/L (ref 98–111)
Creatinine, Ser: 0.82 mg/dL (ref 0.44–1.00)
GFR calc Af Amer: >60 mL/min (ref >60)
GFR calc non Af Amer: >60 mL/min (ref >60)
Glucose, Bld: 162 mg/dL — ABNORMAL HIGH (ref 70–99)
Potassium: 4.1 mmol/L (ref 3.5–5.1)
Sodium: 143 mmol/L (ref 135–145)
Total Bilirubin: 0.4 mg/dL (ref 0.3–1.2)
Total Protein: 7.3 g/dL (ref 6.5–8.1)

## 2019-04-24 LAB — CBC WITH DIFFERENTIAL/PLATELET
Abs Immature Granulocytes: 0.04 10*3/uL (ref 0.00–0.07)
Basophils Absolute: 0 10*3/uL (ref 0.0–0.1)
Basophils Relative: 0 %
Eosinophils Absolute: 0 10*3/uL (ref 0.0–0.5)
Eosinophils Relative: 0 %
HCT: 41.7 % (ref 36.0–46.0)
Hemoglobin: 13.2 g/dL (ref 12.0–15.0)
Immature Granulocytes: 1 %
Lymphocytes Relative: 14 %
Lymphs Abs: 0.9 10*3/uL (ref 0.7–4.0)
MCH: 28.6 pg (ref 26.0–34.0)
MCHC: 31.7 g/dL (ref 30.0–36.0)
MCV: 90.3 fL (ref 80.0–100.0)
Monocytes Absolute: 0.1 10*3/uL (ref 0.1–1.0)
Monocytes Relative: 2 %
Neutro Abs: 4.9 10*3/uL (ref 1.7–7.7)
Neutrophils Relative %: 83 %
Platelets: 276 10*3/uL (ref 150–400)
RBC: 4.62 MIL/uL (ref 3.87–5.11)
RDW: 15.5 % (ref 11.5–15.5)
WBC: 5.9 10*3/uL (ref 4.0–10.5)
nRBC: 0 % (ref 0.0–0.2)

## 2019-04-24 LAB — FERRITIN: Ferritin: 524 ng/mL — ABNORMAL HIGH (ref 11–307)

## 2019-04-24 LAB — FIBRIN DERIVATIVES D-DIMER (ARMC ONLY): Fibrin derivatives D-dimer (ARMC): 981.1 ng/mL (FEU) — ABNORMAL HIGH (ref 0.00–499.00)

## 2019-04-24 LAB — C-REACTIVE PROTEIN: CRP: 2.3 mg/dL — ABNORMAL HIGH (ref <1.0)

## 2019-04-24 MED ORDER — ZINC SULFATE 220 (50 ZN) MG PO CAPS: 220.0000 mg | ORAL_CAPSULE | Freq: Every day | ORAL | 0 refills | Status: DC

## 2019-04-24 MED ORDER — GUAIFENESIN-DM 100-10 MG/5ML PO SYRP
5.0000 mL | ORAL_SOLUTION | ORAL | Status: DC | PRN
  Administered 2019-04-24: 01:00:00 5 mL via ORAL
  Filled 2019-04-24: qty 5

## 2019-04-24 MED ORDER — GUAIFENESIN-DM 100-10 MG/5ML PO SYRP: 5.0000 mL | ORAL_SOLUTION | ORAL | 0 refills | Status: DC | PRN

## 2019-04-24 MED ORDER — DEXAMETHASONE 6 MG PO TABS: 6.0000 mg | ORAL_TABLET | Freq: Every day | ORAL | 0 refills | Status: AC

## 2019-04-24 MED ORDER — ASCORBIC ACID 500 MG PO TABS: 500.0000 mg | ORAL_TABLET | Freq: Every day | ORAL | 0 refills | Status: DC

## 2019-04-24 NOTE — Discharge Summary (Addendum)
Physician Discharge Summary  Maria Richardson [id number] DOB: May 13, 1960 DOA: 04/21/2019  PCP: Steele Sizer, MD  Admit date: 04/21/2019 Discharge date: 04/24/2019  Admitted From: Home Disposition: Home  Recommendations for Outpatient Follow-up:  1. Follow up with PCP in 1-2 weeks 2. Outpatient remdesivir infusion on 1/22 at 11:30 AM.  Transportation for the patient has been arranged.  Patient being discharged on 6 more days of oral Decadron to complete 10-day course of steroid.  Home Health: None Equipment/Devices: None  Discharge Condition: Fair CODE STATUS: Full code Diet recommendation: Regular   Discharge Diagnoses:  Principal Problem:   Acute respiratory failure with hypoxemia (HCC)  Active Problems:   Pneumonia due to COVID-19 virus   Acquired hypothyroidism   Asthma  Brief narrative/HPI 59 year old female with asthma, hypothyroidism presenting with dizziness, poor p.o. intake.  She was tested positive for COVID-19 on 1/15 shortly after her husband was tested positive as well.  Her friend brought over pulse ox at home and found her O2 sat to be in the low 80s. Patient found to have elevated inflammatory markers with chest x-ray showing bilateral infiltrate.  Admitted for acute respiratory failure with hypoxia secondary to COVID-19 pneumonia.  Hospital course  Principal Problem: Acute respiratory failure with hypoxia secondary to COVID-19 pneumonia Placed on IV remdesivir (day 4/5) and Decadron (daily 4/10).  Wean oxygen and satting well on room air.  Inflammatory markers improving. Respiratory symptoms have resolved and patient has some cough and weakness. Continue vitamin C and zinc.  Albuterol inhaler as needed  Needs 1 more day of IV remdesivir.  Outpatient IV infusion arranged for tomorrow including transportation.  Will discharge on oral Decadron to complete 10-day course of steroid.  Prescribed antitussive. Patient stable to be discharged home with  outpatient follow-up.  Detailed instruction for monitoring her symptoms, follow-up and quarantine provided.  Active Problems:   Acquired hypothyroidism Continue Synthroid  Hypotension Patient felt dizzy and lightheaded on 1/20.  Likely due to hypovolemia.  Resolved with IV fluids.  History of asthma Continue home inhaler.    Family Communication  : None  Disposition Plan  : Home   Consults  : None  Procedures  : None   Discharge Instructions  Discharge Instructions    Diet - low sodium heart healthy   Complete by: As directed    Increase activity slowly   Complete by: As directed      Allergies as of 04/24/2019      Reactions   Codeine Nausea And Vomiting      Medication List    STOP taking these medications   albuterol 108 (90 Base) MCG/ACT inhaler Commonly known as: VENTOLIN HFA   fluorouracil 5 % cream Commonly known as: Efudex   loratadine 10 MG tablet Commonly known as: CLARITIN   meloxicam 15 MG tablet Commonly known as: MOBIC   triamcinolone 0.025 % ointment Commonly known as: KENALOG   Vitamin D (Ergocalciferol) 1.25 MG (50000 UNIT) Caps capsule Commonly known as: DRISDOL     TAKE these medications   ascorbic acid 500 MG tablet Commonly known as: VITAMIN C Take 1 tablet (500 mg total) by mouth daily. Start taking on: April 25, 2019   calcipotriene 0.005 % cream Commonly known as: DOVONOX APP AA BID   dexamethasone 6 MG tablet Commonly known as: Decadron Take 1 tablet (6 mg total) by mouth daily for 6 days. Start taking on: April 25, 2019   guaiFENesin-dextromethorphan 100-10 MG/5ML syrup Commonly known as: ROBITUSSIN DM Take  5 mLs by mouth every 4 (four) hours as needed for cough.   levothyroxine 137 MCG tablet Commonly known as: SYNTHROID TAKE 1 TABLET(137 MCG) BY MOUTH DAILY BEFORE BREAKFAST   Trelegy Ellipta 100-62.5-25 MCG/INH Aepb Generic drug: Fluticasone-Umeclidin-Vilant Take 1 puff by mouth daily.   zinc  sulfate 220 (50 Zn) MG capsule Take 1 capsule (220 mg total) by mouth daily. Start taking on: April 25, 2019      Follow-up Information    Ancil Boozer, Drue Stager, MD. Schedule an appointment as soon as possible for a visit in 1 week(s).   Specialty: Family Medicine Contact information: [address] [place] [address] [phone]        remdesevir infusion on 1/22 at 1:30 am at [facility].          Allergies  Allergen Reactions  . Codeine Nausea And Vomiting    Procedures/Studies: DG Chest Port 1 View  Result Date: 04/21/2019 CLINICAL DATA:  Hypoxia EXAM: PORTABLE CHEST 1 VIEW COMPARISON:  09/14/2017 FINDINGS: Bilateral upper lobe predominant interstitial opacities, right greater than left. No pleural effusion or pneumothorax. Normal cardiomediastinal contours. IMPRESSION: Bilateral upper lobe predominant opacities, which may indicate multifocal infection. Electronically Signed   By: Ulyses Jarred M.D.   On: 04/21/2019 23:41       Subjective: Complains of feeling dizzy yesterday with low blood pressure.  Improved with fluids.  Reports some cough but no shortness of breath or dizziness.  Discharge Exam: Vitals:   04/24/19 0013 04/24/19 0802  BP: 130/68 138/79  Pulse: 68 64  Resp: 18 17  Temp: 98.4 F (36.9 C) 98.3 F (36.8 C)  SpO2: 95% 94%   Vitals:   04/23/19 1537 04/23/19 1739 04/24/19 0013 04/24/19 0802  BP: (!) 97/52 102/65 130/68 138/79  Pulse: 72 75 68 64  Resp: 17 18 18 17   Temp: 98.5 F (36.9 C) 98.3 F (36.8 C) 98.4 F (36.9 C) 98.3 F (36.8 C)  TempSrc: Oral Oral Oral   SpO2: 91% 96% 95% 94%  Weight:      Height:        General: Middle-aged female not in distress HEENT: Moist mucosa, supple neck Chest: Clear CVs: Normal S1-S2 GI: Soft, nondistended, nontender Musculoskeletal: Warm, negative    The results of significant diagnostics from this hospitalization (including imaging, microbiology, ancillary  and laboratory) are listed below for reference.     Microbiology: Recent Results (from the past 240 hour(s))  Blood Culture (routine x 2)     Status: None (Preliminary result)   Collection Time: 04/22/19 12:40 AM   Specimen: BLOOD  Result Value Ref Range Status   Specimen Description BLOOD LEFT ANTECUBITAL  Final   Special Requests   Final    BOTTLES DRAWN AEROBIC AND ANAEROBIC Blood Culture adequate volume   Culture   Final    NO GROWTH 2 DAYS Performed at [facility], [address] [place], [place] [address]    Report Status PENDING  Incomplete  Blood Culture (routine x 2)     Status: None (Preliminary result)   Collection Time: 04/22/19 12:40 AM   Specimen: BLOOD  Result Value Ref Range Status   Specimen Description BLOOD BLOOD RIGHT HAND  Final   Special Requests   Final    BOTTLES DRAWN AEROBIC AND ANAEROBIC Blood Culture adequate volume   Culture   Final    NO GROWTH 2 DAYS Performed at [facility], [place], [place]  [address]    Report Status PENDING  Incomplete  SARS CORONAVIRUS 2 (TAT 6-24 HRS) Nasopharyngeal Nasopharyngeal Swab     Status: Abnormal   Collection Time: 04/22/19 12:42 AM   Specimen: Nasopharyngeal Swab  Result Value Ref Range Status   SARS Coronavirus 2 POSITIVE (A) NEGATIVE Final    Comment: RESULT CALLED TO, READ BACK BY AND VERIFIED WITH: Torrie Mayers RN 10:45 04/22/19 ([email or web address]) (NOTE) SARS-CoV-2 target nucleic acids are DETECTED. The SARS-CoV-2 RNA is generally detectable in upper and lower respiratory specimens during the acute phase of infection. Positive results are indicative of the presence of SARS-CoV-2 RNA. Clinical correlation with patient history and other diagnostic information is  necessary to determine patient infection status. Positive results do not rule out bacterial infection or co-infection with other viruses.  The expected result is Negative. Fact Sheet for  Patients: SugarRoll.be Fact Sheet for Healthcare Providers: https://www.woods-mathews.com/ This test is not yet approved or cleared by the [place] FDA and  has been authorized for detection and/or diagnosis of SARS-CoV-2 by FDA under an Emergency Use Authorization (EUA). This EUA will remain  in effect (meaning this test can be used) for th e duration of the COVID-19 declaration under Section 564(b)(1) of the Act, 21 U.S.C. section 360bbb-3(b)(1), unless the authorization is terminated or revoked sooner. Performed at [facility], [place] [address] [place], [place] [address]      Labs: BNP (last 3 results) Recent Labs    04/21/19 2039  BNP [id number]   Basic Metabolic Panel: Recent Labs  Lab 04/21/19 2039 04/23/19 0354 04/24/19 0340  NA 138 139 143  K 4.0 4.2 4.1  CL 101 102 108  CO2 28 32 25  GLUCOSE 120* 134* 162*  BUN 10 18 17   CREATININE 0.89 0.82 0.82  CALCIUM 9.0 8.9 8.5*   Liver Function Tests: Recent Labs  Lab 04/21/19 2039 04/23/19 0354 04/24/19 0340  AST 20 17 24   ALT 13 13 15   ALKPHOS 81 69 81  BILITOT 0.7 0.4 0.4  PROT 7.6 6.6 7.3  ALBUMIN 3.5 3.0* 3.1*   No results for input(s): LIPASE, AMYLASE in the last 168 hours. No results for input(s): AMMONIA in the last 168 hours. CBC: Recent Labs  Lab 04/21/19 2039 04/23/19 0354 04/24/19 0340  WBC 4.6 7.6 5.9  NEUTROABS 2.8 5.5 4.9  HGB 13.5 13.0 13.2  HCT 42.8 41.2 41.7  MCV 90.7 90.4 90.3  PLT 234 249 276   Cardiac Enzymes: No results for input(s): CKTOTAL, CKMB, CKMBINDEX, TROPONINI in the last 168 hours. BNP: Invalid input(s): POCBNP CBG: No results for input(s): GLUCAP in the last 168 hours. D-Dimer No results for input(s): DDIMER in the last 72 hours. Hgb A1c No results for input(s): HGBA1C in the last 72 hours. Lipid Profile Recent Labs    04/21/19 2039  TRIG 66   Thyroid function studies No results for input(s): TSH, T4TOTAL,  T3FREE, THYROIDAB in the last 72 hours.  Invalid input(s): FREET3 Anemia work up Recent Labs    04/23/19 0354 04/24/19 0340  FERRITIN 398* 524*   Urinalysis    Component Value Date/Time   COLORURINE YELLOW (A) 06/13/2016 1740   APPEARANCEUR HAZY (A) 06/13/2016 1740   LABSPEC 1.027 06/13/2016 1740   PHURINE 5.0 06/13/2016 1740   GLUCOSEU NEGATIVE 06/13/2016 1740   HGBUR MODERATE (A) 06/13/2016 1740   BILIRUBINUR NEGATIVE 06/13/2016 1740   KETONESUR 5 (A) 06/13/2016 1740   PROTEINUR 100 (A) 06/13/2016 1740   NITRITE NEGATIVE  06/13/2016 1740   LEUKOCYTESUR NEGATIVE 06/13/2016 1740   Sepsis Labs Invalid input(s): PROCALCITONIN,  WBC,  LACTICIDVEN Microbiology Recent Results (from the past 240 hour(s))  Blood Culture (routine x 2)     Status: None (Preliminary result)   Collection Time: 04/22/19 12:40 AM   Specimen: BLOOD  Result Value Ref Range Status   Specimen Description BLOOD LEFT ANTECUBITAL  Final   Special Requests   Final    BOTTLES DRAWN AEROBIC AND ANAEROBIC Blood Culture adequate volume   Culture   Final    NO GROWTH 2 DAYS Performed at [facility], [address] [place], [place] [address]    Report Status PENDING  Incomplete  Blood Culture (routine x 2)     Status: None (Preliminary result)   Collection Time: 04/22/19 12:40 AM   Specimen: BLOOD  Result Value Ref Range Status   Specimen Description BLOOD BLOOD RIGHT HAND  Final   Special Requests   Final    BOTTLES DRAWN AEROBIC AND ANAEROBIC Blood Culture adequate volume   Culture   Final    NO GROWTH 2 DAYS Performed at [facility], [address] [place], [place] [address]    Report Status PENDING  Incomplete  SARS CORONAVIRUS 2 (TAT 6-24 HRS) Nasopharyngeal Nasopharyngeal Swab     Status: Abnormal   Collection Time: 04/22/19 12:42 AM   Specimen: Nasopharyngeal Swab  Result Value Ref Range Status   SARS Coronavirus 2 POSITIVE (A) NEGATIVE Final    Comment: RESULT CALLED TO,  READ BACK BY AND VERIFIED WITH: Torrie Mayers RN 10:45 04/22/19 ([email or web address]) (NOTE) SARS-CoV-2 target nucleic acids are DETECTED. The SARS-CoV-2 RNA is generally detectable in upper and lower respiratory specimens during the acute phase of infection. Positive results are indicative of the presence of SARS-CoV-2 RNA. Clinical correlation with patient history and other diagnostic information is  necessary to determine patient infection status. Positive results do not rule out bacterial infection or co-infection with other viruses.  The expected result is Negative. Fact Sheet for Patients: SugarRoll.be Fact Sheet for Healthcare Providers: https://www.woods-mathews.com/ This test is not yet approved or cleared by the [place] FDA and  has been authorized for detection and/or diagnosis of SARS-CoV-2 by FDA under an Emergency Use Authorization (EUA). This EUA will remain  in effect (meaning this test can be used) for th e duration of the COVID-19 declaration under Section 564(b)(1) of the Act, 21 U.S.C. section 360bbb-3(b)(1), unless the authorization is terminated or revoked sooner. Performed at [facility], [place] [address], [place], [place] [address]      Time coordinating discharge: 35 minutes  SIGNED:   Louellen Molder, MD  [facility] 04/24/2019, 10:16 AM Pager   If 7PM-7AM, please contact night-coverage [email or web address] Password [email or web address]

## 2019-04-24 NOTE — Discharge Instructions (Addendum)
You are scheduled for an outpatient infusion of Remdesivir at 11:30 am on Friday 1/22.   Please [facility] to [facility] at [address]  Drive to the security guard and tell them you are here for an infusion. They will direct you to the front entrance where we will come and get you.  For questions call [phone].  Thanks    10 Things You Can Do to Manage Your COVID-19 Symptoms at Home If you have possible or confirmed COVID-19: 1. Stay home from work and school. And stay away from other public places. If you must go out, avoid using any kind of public transportation, ridesharing, or taxis. 2. Monitor your symptoms carefully. If your symptoms get worse, call your healthcare provider immediately. 3. Get rest and stay hydrated. 4. If you have a medical appointment, call the healthcare provider ahead of time and tell them that you have or may have COVID-19. 5. For medical emergencies, call 911 and notify the dispatch personnel that you have or may have COVID-19. 6. Cover your cough and sneezes with a tissue or use the inside of your elbow. 7. Wash your hands often with soap and water for at least 20 seconds or clean your hands with an alcohol-based hand sanitizer that contains at least 60% alcohol. 8. As much as possible, stay in a specific room and away from other people in your home. Also, you should use a separate bathroom, if available. If you need to be around other people in or outside of the home, wear a mask. 9. Avoid sharing personal items with other people in your household, like dishes, towels, and bedding. 10. Clean all surfaces that are touched often, like counters, tabletops, and doorknobs. Use household cleaning sprays or wipes according to the label instructions. michellinders.com 10/02/2018 This information is not intended to replace advice given to you by your health care provider. Make sure you discuss any questions you have with your health care provider. Document  Revised: 03/06/2019 Document Reviewed: 03/06/2019 Elsevier Patient Education  [place].   COVID-19: How to Protect Yourself and Others Know how it spreads  There is currently no vaccine to prevent coronavirus disease 2019 (COVID-19).  The best way to prevent illness is to avoid being exposed to this virus.  The virus is thought to spread mainly from person-to-person. ? Between people who are in close contact with one another (within about 6 feet). ? Through respiratory droplets produced when an infected person coughs, sneezes or talks. ? These droplets can land in the mouths or noses of people who are nearby or possibly be inhaled into the lungs. ? COVID-19 may be spread by people who are not showing symptoms. Everyone should Clean your hands often  Wash your hands often with soap and water for at least 20 seconds especially after you have been in a public place, or after blowing your nose, coughing, or sneezing.  If soap and water are not readily available, use a hand sanitizer that contains at least 60% alcohol. Cover all surfaces of your hands and rub them together until they feel dry.  Avoid touching your eyes, nose, and mouth with unwashed hands. Avoid close contact  Limit contact with others as much as possible.  Avoid close contact with people who are sick.  Put distance between yourself and other people. ? Remember that some people without symptoms may be able to spread virus. ? This is especially important for people who are at higher risk  of getting very GainPain.com.cy Cover your mouth and nose with a mask when around others  You could spread COVID-19 to others even if you do not feel sick.  Everyone should wear a mask in public settings and when around people not living in their household, especially when social distancing is difficult to maintain. ? Masks should not be placed on young  children under age 37, anyone who has trouble breathing, or is unconscious, incapacitated or otherwise unable to remove the mask without assistance.  The mask is meant to protect other people in case you are infected.  Do NOT use a facemask meant for a Dietitian.  Continue to keep about 6 feet between yourself and others. The mask is not a substitute for social distancing. Cover coughs and sneezes  Always cover your mouth and nose with a tissue when you cough or sneeze or use the inside of your elbow.  Throw used tissues in the trash.  Immediately wash your hands with soap and water for at least 20 seconds. If soap and water are not readily available, clean your hands with a hand sanitizer that contains at least 60% alcohol. Clean and disinfect  Clean AND disinfect frequently touched surfaces daily. This includes tables, doorknobs, light switches, countertops, handles, desks, phones, keyboards, toilets, faucets, and sinks. RackRewards.fr  If surfaces are dirty, clean them: Use detergent or soap and water prior to disinfection.  Then, use a household disinfectant. You can see a list of EPA-registered household disinfectants here. michellinders.com 12/04/2018 This information is not intended to replace advice given to you by your health care provider. Make sure you discuss any questions you have with your health care provider. Document Revised: 12/12/2018 Document Reviewed: 10/10/2018 Elsevier Patient Education  [place].

## 2019-04-24 NOTE — Progress Notes (Signed)
Patient scheduled for outpatient Remdesivir infusion at 11:30 am on Friday 1/22.   Please advise them to report to [facility] at [address].  Drive to the security guard and tell them you are here for an infusion. They will direct you to the front entrance where we will come and get you.  For questions call [phone].  Thanks

## 2019-04-24 NOTE — Plan of Care (Signed)

## 2019-04-24 NOTE — Progress Notes (Signed)
Pt discharged home with family. Discharge paperwork and appointments gone over with patient by day shift RN, Jacelyn Pi.

## 2019-04-25 ENCOUNTER — Ambulatory Visit (HOSPITAL_COMMUNITY)
Admission: RE | Admit: 2019-04-25 | Discharge: 2019-04-25 | Disposition: A | Discharge status: Home / Self Care | Payer: Managed Care, Other (non HMO) | Source: Ambulatory Visit | Attending: Pulmonary Disease | Admitting: Pulmonary Disease

## 2019-04-25 ENCOUNTER — Telehealth: Payer: Self-pay

## 2019-04-25 VITALS — BP 120/70 | HR 70 | Temp 98.1°F | Resp 20

## 2019-04-25 DIAGNOSIS — U071 COVID-19: Secondary | ICD-10-CM | POA: Diagnosis not present

## 2019-04-25 DIAGNOSIS — J1282 Pneumonia due to coronavirus disease 2019: Secondary | ICD-10-CM | POA: Insufficient documentation

## 2019-04-25 MED ORDER — SODIUM CHLORIDE 0.9 % IV SOLN: INTRAVENOUS | Status: DC | PRN

## 2019-04-25 MED ORDER — METHYLPREDNISOLONE SODIUM SUCC 125 MG IJ SOLR: 125.0000 mg | Freq: Once | INTRAMUSCULAR | Status: DC | PRN

## 2019-04-25 MED ORDER — SODIUM CHLORIDE 0.9 % IV SOLN
INTRAVENOUS | Status: AC
  Administered 2019-04-25: 100 mg via INTRAVENOUS
  Filled 2019-04-25: qty 20

## 2019-04-25 MED ORDER — SODIUM CHLORIDE 0.9 % IV SOLN: 100.0000 mg | Freq: Once | INTRAVENOUS | Status: AC

## 2019-04-25 MED ORDER — ALBUTEROL SULFATE HFA 108 (90 BASE) MCG/ACT IN AERS: 2.0000 | INHALATION_SPRAY | Freq: Once | RESPIRATORY_TRACT | Status: DC | PRN

## 2019-04-25 MED ORDER — EPINEPHRINE 0.3 MG/0.3ML IJ SOAJ: 0.3000 mg | Freq: Once | INTRAMUSCULAR | Status: DC | PRN

## 2019-04-25 MED ORDER — FAMOTIDINE IN NACL 20-0.9 MG/50ML-% IV SOLN: 20.0000 mg | Freq: Once | INTRAVENOUS | Status: DC | PRN

## 2019-04-25 MED ORDER — DIPHENHYDRAMINE HCL 50 MG/ML IJ SOLN: 50.0000 mg | Freq: Once | INTRAMUSCULAR | Status: DC | PRN

## 2019-04-25 NOTE — Progress Notes (Signed)
  Diagnosis: COVID-19  Physician: Dr. Joya Gaskins  Procedure: [facility] Med: remdesivir infusion.  Complications: No immediate complications noted.  Discharge: Discharged home   Acquanetta Chain 04/25/2019

## 2019-04-25 NOTE — Telephone Encounter (Signed)
Transition Care Management Follow-up Telephone Call  Date of discharge and from where: 04/24/19 [facility]  How have you been since you were released from the hospital? Pt states she is doing okay. Denies pain, only SOB on exertion.   Any questions or concerns? No   Items Reviewed:  Did the pt receive and understand the discharge instructions provided? Yes   Medications obtained and verified? Yes  - pt to pick up prescriptions today after infusion  Any new allergies since your discharge? No   Dietary orders reviewed? Yes  Do you have support at home? Yes   Functional Questionnaire: (I = Independent and D = Dependent) ADLs: I  Bathing/Dressing- I  Meal Prep- I  Eating- I  Maintaining continence- I  Transferring/Ambulation- I  Managing Meds- I  Follow up appointments reviewed:   PCP Hospital f/u appt confirmed? Yes  Scheduled to see Dr. Ancil Boozer on 04/29/19 @ 10:20. Virtual.   Are transportation arrangements needed? No   If their condition worsens, is the pt aware to call PCP or go to the Emergency Dept.? Yes  Was the patient provided with contact information for the PCP's office or ED? Yes  Was to pt encouraged to call back with questions or concerns? Yes

## 2019-04-25 NOTE — Discharge Instructions (Signed)
10 Things You Can Do to Manage Your COVID-19 Symptoms at Home If you have possible or confirmed COVID-19: 1. Stay home from work and school. And stay away from other public places. If you must go out, avoid using any kind of public transportation, ridesharing, or taxis. 2. Monitor your symptoms carefully. If your symptoms get worse, call your healthcare provider immediately. 3. Get rest and stay hydrated. 4. If you have a medical appointment, call the healthcare provider ahead of time and tell them that you have or may have COVID-19. 5. For medical emergencies, call 911 and notify the dispatch personnel that you have or may have COVID-19. 6. Cover your cough and sneezes with a tissue or use the inside of your elbow. 7. Wash your hands often with soap and water for at least 20 seconds or clean your hands with an alcohol-based hand sanitizer that contains at least 60% alcohol. 8. As much as possible, stay in a specific room and away from other people in your home. Also, you should use a separate bathroom, if available. If you need to be around other people in or outside of the home, wear a mask. 9. Avoid sharing personal items with other people in your household, like dishes, towels, and bedding. 10. Clean all surfaces that are touched often, like counters, tabletops, and doorknobs. Use household cleaning sprays or wipes according to the label instructions. cdc.gov/coronavirus 10/02/2018 This information is not intended to replace advice given to you by your health care provider. Make sure you discuss any questions you have with your health care provider. Document Revised: 03/06/2019 Document Reviewed: 03/06/2019 Elsevier Patient Education  2020 Elsevier Inc.  

## 2019-04-27 LAB — CULTURE, BLOOD (ROUTINE X 2)
Culture: NO GROWTH
Culture: NO GROWTH
Special Requests: ADEQUATE
Special Requests: ADEQUATE

## 2019-04-29 ENCOUNTER — Ambulatory Visit (INDEPENDENT_AMBULATORY_CARE_PROVIDER_SITE_OTHER): Payer: Managed Care, Other (non HMO) | Admitting: Family Medicine

## 2019-04-29 ENCOUNTER — Encounter: Payer: Self-pay | Admitting: Family Medicine

## 2019-04-29 VITALS — HR 85

## 2019-04-29 DIAGNOSIS — Z09 Encounter for follow-up examination after completed treatment for conditions other than malignant neoplasm: Secondary | ICD-10-CM | POA: Diagnosis not present

## 2019-04-29 DIAGNOSIS — Z8616 Personal history of COVID-19: Secondary | ICD-10-CM

## 2019-04-29 DIAGNOSIS — J449 Chronic obstructive pulmonary disease, unspecified: Secondary | ICD-10-CM | POA: Diagnosis not present

## 2019-04-29 MED ORDER — TRELEGY ELLIPTA 100-62.5-25 MCG/INH IN AEPB: 1.0000 | INHALATION_SPRAY | Freq: Every day | RESPIRATORY_TRACT | 1 refills | Status: DC

## 2019-04-29 NOTE — Progress Notes (Signed)
Name: Maria Richardson   MRN: [id number]    DOB: 11-23-1960   Date:04/29/2019       Progress Note  Subjective  Chief Complaint  Chief Complaint  Patient presents with  . Medication Refill    She needs a refill on her asthma medications.  . Positive Covid    Discharged from hospital last Thursday. She has been quarantined for 15 days. She has infusion treatment. She is also taking Vitamin C, prednisone and Zinc. She can return to work the second week in February.    I connected with  Maria Richardson  on 04/29/19 at 10:20 AM EST by a video enabled telemedicine application and verified that I am speaking with the correct person using two identifiers.  I discussed the limitations of evaluation and management by telemedicine and the availability of in person appointments. The patient expressed understanding and agreed to proceed. Staff also discussed with the patient that there may be a patient responsible charge related to this service. Patient Location: at home  Provider Location: [facility] discharge follow up: she had an asthma flare 03/30/2019 and COVID-19 test was negative. She was exposed to someone 01/10/201, she tested positive on 04/18/2019. She states when she turned positive she only had a dry cough and lack of appetite, but on the 17 th her pulse ox started to drop to 80% and she went to [facility], she was admitted to [facility] she was given Remdesivir 3 doses at [facility] and was given an infusion at home , she was also sent hom with a 10 day course of steroid and diagnosed with COVID-19 pneumonia  She states she is feeling a lot better, she has been getting up every day , she is using a peak flow at home. Her pulse ox has improved at home . She needs refill of Trelegy. SOB is mostly when moving now. She will go back to work in about 2 weeks. She is due for regular labs and follow up with me in 4-6 weeks and we will recheck labs    Patient Active Problem List   Diagnosis Date Noted  . Pneumonia due to COVID-19 virus 04/22/2019  . Acute respiratory failure with hypoxemia ([place]) 04/22/2019  . Asthma 04/22/2019  . Dyslipidemia 08/04/2015  . Benign neoplasm of sigmoid colon   . Seasonal allergic rhinitis 01/07/2015  . 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  . History of anemia 01/07/2015  . Excess weight 01/07/2015  . 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 Leiomyoma of uterus 01/07/2015  . Calculus of gallbladder 01/07/2015  . Acquired hypothyroidism 09/24/2009  . 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 Vitamin D deficiency 05/18/2009    Past Surgical History:  Procedure Laterality Date  . APPENDECTOMY    . COLONOSCOPY WITH PROPOFOL N/A 07/23/2015   Procedure: COLONOSCOPY WITH PROPOFOL;  Surgeon: Lucilla Lame, MD;  Location: [place];  Service: Endoscopy;  Laterality: N/A;  PLEASE LEAVE PT AT [place]     as teenager  . POLYPECTOMY  07/23/2015   Procedure: POLYPECTOMY;  Surgeon: Lucilla Lame, MD;  Location: [place];  Service: Endoscopy;;    Family History  Problem Relation Age of Onset  . Diabetes Father   . Hypertension Father   . Kidney disease Father   . Colon cancer Father      Current Outpatient Medications:  .  ascorbic acid (VITAMIN C) 500 MG tablet, Take 1 tablet (500 mg total)  by mouth daily., Disp: 30 tablet, Rfl: 0 .  calcipotriene (DOVONOX) 0.005 % cream, APP AA BID, Disp: , Rfl: 1 .  dexamethasone (DECADRON) 6 MG tablet, Take 1 tablet (6 mg total) by mouth daily for 6 days., Disp: 6 tablet, Rfl: 0 .  Fluticasone-Umeclidin-Vilant (TRELEGY ELLIPTA) 100-62.5-25 MCG/INH AEPB, Take 1 puff by mouth daily., Disp: 60 each, Rfl: 0 .  guaiFENesin-dextromethorphan (ROBITUSSIN DM) 100-10 MG/5ML syrup, Take 5 mLs by mouth every 4 (four) hours as needed for cough., Disp: 118 mL, Rfl: 0 .  levothyroxine (SYNTHROID) 150 MCG tablet, Take 150 mcg by mouth daily., Disp: , Rfl:  .  zinc sulfate 220 (50 Zn) MG capsule, Take 1  capsule (220 mg total) by mouth daily., Disp: 30 capsule, Rfl: 0 .  levothyroxine (SYNTHROID) 137 MCG tablet, TAKE 1 TABLET(137 MCG) BY MOUTH DAILY BEFORE BREAKFAST (Patient not taking: Reported on 04/29/2019), Disp: 90 tablet, Rfl: 0  Allergies  Allergen Reactions  . Codeine Nausea And Vomiting    I personally reviewed active problem list, medication list, allergies, family history, social history with the patient/caregiver today.   ROS  Ten systems reviewed and is negative except as mentioned in HPI   Objective  Virtual encounter, vitals  Obtained at home  Vitals:   04/29/19 1028  Pulse: 85  SpO2: 97%    There is no height or weight on file to calculate BMI.  Physical Exam  Awake, alert and oriented  PHQ2/9: Depression screen [facility] 2/9 04/29/2019 10/17/2018 12/21/2017 04/27/2017 01/10/2017  Decreased Interest 0 0 0 0 0  Down, Depressed, Hopeless 0 0 0 0 0  PHQ - 2 Score 0 0 0 0 0  Altered sleeping 0 0 0 - -  Tired, decreased energy 0 0 0 - -  Change in appetite 0 0 0 - -  Feeling bad or failure about yourself  0 0 0 - -  Trouble concentrating 0 0 0 - -  Moving slowly or fidgety/restless 0 0 0 - -  Suicidal thoughts 0 0 0 - -  PHQ-9 Score 0 0 0 - -  Difficult doing work/chores - Somewhat difficult Not difficult at all - -   PHQ-2/9 Result is negative.    Fall Risk: Fall Risk  04/29/2019 10/17/2018 02/21/2018 12/21/2017 09/14/2017  Falls in the past year? 0 0 0 No No  Number falls in past yr: 0 0 0 - -  Injury with Fall? 0 0 0 - -     Assessment & Plan  1. COPD with asthma ([place])  - Fluticasone-Umeclidin-Vilant (TRELEGY ELLIPTA) 100-62.5-25 MCG/INH AEPB; Take 1 puff by mouth daily.  Dispense: 180 each; Refill: 1  2. History of COVID-19  Doing better, keep monitoring pulse ox  3. Hospital discharge follow-up  Doing better, we will recheck labs when she comes back for follow up in 4-6 weeks  I discussed the assessment and treatment plan with the patient. The  patient was provided an opportunity to ask questions and all were answered. The patient agreed with the plan and demonstrated an understanding of the instructions.  The patient was advised to call back or seek an in-person evaluation if the symptoms worsen or if the condition fails to improve as anticipated.  I provided 25 minutes of non-face-to-face time during this encounter.

## 2019-05-05 ENCOUNTER — Telehealth: Payer: Self-pay | Admitting: Family Medicine

## 2019-05-05 NOTE — Telephone Encounter (Signed)
Pt said that she is now going to need a note to return to work. She also needs a note for Treuligy. She is in the lobby waiting

## 2019-05-06 NOTE — Telephone Encounter (Signed)
Patient no longer needs a work note. She spoke with [employer] at her company and she is working from home and went back May 05, 2019.

## 2019-05-19 ENCOUNTER — Ambulatory Visit: Payer: Self-pay

## 2019-05-19 ENCOUNTER — Inpatient Hospital Stay
Admission: EM | Admit: 2019-05-19 | Discharge: 2019-05-21 | DRG: 189 | Disposition: A | Discharge status: Home / Self Care | Payer: Managed Care, Other (non HMO) | Attending: Internal Medicine | Admitting: Internal Medicine

## 2019-05-19 ENCOUNTER — Emergency Department: Payer: Managed Care, Other (non HMO)

## 2019-05-19 ENCOUNTER — Encounter: Payer: Self-pay | Admitting: *Deleted

## 2019-05-19 ENCOUNTER — Other Ambulatory Visit: Payer: Self-pay

## 2019-05-19 DIAGNOSIS — Z885 Allergy status to narcotic agent status: Secondary | ICD-10-CM

## 2019-05-19 DIAGNOSIS — J449 Chronic obstructive pulmonary disease, unspecified: Secondary | ICD-10-CM | POA: Diagnosis not present

## 2019-05-19 DIAGNOSIS — Z9981 Dependence on supplemental oxygen: Secondary | ICD-10-CM

## 2019-05-19 DIAGNOSIS — L280 Lichen simplex chronicus: Secondary | ICD-10-CM | POA: Diagnosis present

## 2019-05-19 DIAGNOSIS — Z9049 Acquired absence of other specified parts of digestive tract: Secondary | ICD-10-CM

## 2019-05-19 DIAGNOSIS — B948 Sequelae of other specified infectious and parasitic diseases: Secondary | ICD-10-CM

## 2019-05-19 DIAGNOSIS — J439 Emphysema, unspecified: Secondary | ICD-10-CM | POA: Diagnosis present

## 2019-05-19 DIAGNOSIS — R918 Other nonspecific abnormal finding of lung field: Secondary | ICD-10-CM | POA: Diagnosis present

## 2019-05-19 DIAGNOSIS — Z79890 Hormone replacement therapy (postmenopausal): Secondary | ICD-10-CM

## 2019-05-19 DIAGNOSIS — Z79899 Other long term (current) drug therapy: Secondary | ICD-10-CM

## 2019-05-19 DIAGNOSIS — J432 Centrilobular emphysema: Secondary | ICD-10-CM

## 2019-05-19 DIAGNOSIS — Z8701 Personal history of pneumonia (recurrent): Secondary | ICD-10-CM

## 2019-05-19 DIAGNOSIS — E039 Hypothyroidism, unspecified: Secondary | ICD-10-CM | POA: Diagnosis not present

## 2019-05-19 DIAGNOSIS — U071 COVID-19: Secondary | ICD-10-CM | POA: Diagnosis not present

## 2019-05-19 DIAGNOSIS — Z833 Family history of diabetes mellitus: Secondary | ICD-10-CM

## 2019-05-19 DIAGNOSIS — R0902 Hypoxemia: Secondary | ICD-10-CM | POA: Diagnosis not present

## 2019-05-19 DIAGNOSIS — J302 Other seasonal allergic rhinitis: Secondary | ICD-10-CM | POA: Diagnosis present

## 2019-05-19 DIAGNOSIS — Z87891 Personal history of nicotine dependence: Secondary | ICD-10-CM

## 2019-05-19 DIAGNOSIS — J9601 Acute respiratory failure with hypoxia: Principal | ICD-10-CM | POA: Diagnosis present

## 2019-05-19 DIAGNOSIS — Z8249 Family history of ischemic heart disease and other diseases of the circulatory system: Secondary | ICD-10-CM

## 2019-05-19 DIAGNOSIS — Z800 Family history of malignant neoplasm of digestive organs: Secondary | ICD-10-CM

## 2019-05-19 DIAGNOSIS — Z841 Family history of disorders of kidney and ureter: Secondary | ICD-10-CM

## 2019-05-19 LAB — COMPREHENSIVE METABOLIC PANEL
ALT: 262 U/L — ABNORMAL HIGH (ref 0–44)
AST: 64 U/L — ABNORMAL HIGH (ref 15–41)
Albumin: 3.4 g/dL — ABNORMAL LOW (ref 3.5–5.0)
Alkaline Phosphatase: 176 U/L — ABNORMAL HIGH (ref 38–126)
Anion gap: 9 (ref 5–15)
BUN: 20 mg/dL (ref 6–20)
CO2: 25 mmol/L (ref 22–32)
Calcium: 9.4 mg/dL (ref 8.9–10.3)
Chloride: 102 mmol/L (ref 98–111)
Creatinine, Ser: 0.92 mg/dL (ref 0.44–1.00)
GFR calc Af Amer: >60 mL/min (ref >60)
GFR calc non Af Amer: >60 mL/min (ref >60)
Glucose, Bld: 118 mg/dL — ABNORMAL HIGH (ref 70–99)
Potassium: 4.4 mmol/L (ref 3.5–5.1)
Sodium: 136 mmol/L (ref 135–145)
Total Bilirubin: 0.5 mg/dL (ref 0.3–1.2)
Total Protein: 7.7 g/dL (ref 6.5–8.1)

## 2019-05-19 LAB — CBC
HCT: 44.9 % (ref 36.0–46.0)
Hemoglobin: 14.5 g/dL (ref 12.0–15.0)
MCH: 29.3 pg (ref 26.0–34.0)
MCHC: 32.3 g/dL (ref 30.0–36.0)
MCV: 90.7 fL (ref 80.0–100.0)
Platelets: 214 10*3/uL (ref 150–400)
RBC: 4.95 MIL/uL (ref 3.87–5.11)
RDW: 16.8 % — ABNORMAL HIGH (ref 11.5–15.5)
WBC: 9.6 10*3/uL (ref 4.0–10.5)
nRBC: 0 % (ref 0.0–0.2)

## 2019-05-19 LAB — TROPONIN I (HIGH SENSITIVITY)
Troponin I (High Sensitivity): 2 ng/L (ref <18)
Troponin I (High Sensitivity): 3 ng/L (ref <18)

## 2019-05-19 LAB — RESPIRATORY PANEL BY RT PCR (FLU A&B, COVID)
Influenza A by PCR: NEGATIVE
Influenza B by PCR: NEGATIVE
SARS Coronavirus 2 by RT PCR: POSITIVE — AB

## 2019-05-19 MED ORDER — UMECLIDINIUM BROMIDE 62.5 MCG/INH IN AEPB
1.0000 | INHALATION_SPRAY | Freq: Every day | RESPIRATORY_TRACT | Status: DC
  Administered 2019-05-20 – 2019-05-21 (×2): 1 via RESPIRATORY_TRACT
  Filled 2019-05-19: qty 7

## 2019-05-19 MED ORDER — ALBUTEROL SULFATE HFA 108 (90 BASE) MCG/ACT IN AERS
2.0000 | INHALATION_SPRAY | Freq: Four times a day (QID) | RESPIRATORY_TRACT | Status: DC
  Administered 2019-05-20 – 2019-05-21 (×5): 2 via RESPIRATORY_TRACT
  Filled 2019-05-19: qty 6.7

## 2019-05-19 MED ORDER — ENOXAPARIN SODIUM 40 MG/0.4ML ~~LOC~~ SOLN
40.0000 mg | SUBCUTANEOUS | Status: DC
  Administered 2019-05-19 – 2019-05-20 (×2): 40 mg via SUBCUTANEOUS
  Filled 2019-05-19 (×2): qty 0.4

## 2019-05-19 MED ORDER — SODIUM CHLORIDE 0.9 % IV SOLN: INTRAVENOUS | Status: DC

## 2019-05-19 MED ORDER — FLUTICASONE FUROATE-VILANTEROL 100-25 MCG/INH IN AEPB
1.0000 | INHALATION_SPRAY | Freq: Every day | RESPIRATORY_TRACT | Status: DC
  Administered 2019-05-20 – 2019-05-21 (×2): 1 via RESPIRATORY_TRACT
  Filled 2019-05-19: qty 28

## 2019-05-19 MED ORDER — METHYLPREDNISOLONE SODIUM SUCC 125 MG IJ SOLR
125.0000 mg | Freq: Once | INTRAMUSCULAR | Status: AC
  Administered 2019-05-19: 125 mg via INTRAVENOUS
  Filled 2019-05-19: qty 2

## 2019-05-19 MED ORDER — ACETAMINOPHEN 650 MG RE SUPP: 650.0000 mg | Freq: Four times a day (QID) | RECTAL | Status: DC | PRN

## 2019-05-19 MED ORDER — ASCORBIC ACID 500 MG PO TABS
500.0000 mg | ORAL_TABLET | Freq: Every day | ORAL | Status: DC
  Administered 2019-05-19 – 2019-05-21 (×3): 500 mg via ORAL
  Filled 2019-05-19 (×3): qty 1

## 2019-05-19 MED ORDER — ONDANSETRON HCL 4 MG/2ML IJ SOLN: 4.0000 mg | Freq: Four times a day (QID) | INTRAMUSCULAR | Status: DC | PRN

## 2019-05-19 MED ORDER — ALBUTEROL SULFATE HFA 108 (90 BASE) MCG/ACT IN AERS
2.0000 | INHALATION_SPRAY | Freq: Once | RESPIRATORY_TRACT | Status: AC
  Administered 2019-05-19: 2 via RESPIRATORY_TRACT
  Filled 2019-05-19: qty 6.7

## 2019-05-19 MED ORDER — IOHEXOL 350 MG/ML SOLN
75.0000 mL | Freq: Once | INTRAVENOUS | Status: AC | PRN
  Administered 2019-05-19: 75 mL via INTRAVENOUS
  Filled 2019-05-19: qty 75

## 2019-05-19 MED ORDER — ONDANSETRON HCL 4 MG PO TABS: 4.0000 mg | ORAL_TABLET | Freq: Four times a day (QID) | ORAL | Status: DC | PRN

## 2019-05-19 MED ORDER — CALCIPOTRIENE 0.005 % EX CREA: TOPICAL_CREAM | Freq: Two times a day (BID) | CUTANEOUS | Status: DC

## 2019-05-19 MED ORDER — ACETAMINOPHEN 325 MG PO TABS: 650.0000 mg | ORAL_TABLET | Freq: Four times a day (QID) | ORAL | Status: DC | PRN

## 2019-05-19 MED ORDER — ZINC SULFATE 220 (50 ZN) MG PO CAPS
220.0000 mg | ORAL_CAPSULE | Freq: Every day | ORAL | Status: DC
  Administered 2019-05-19 – 2019-05-21 (×3): 220 mg via ORAL
  Filled 2019-05-19 (×3): qty 1

## 2019-05-19 MED ORDER — LEVOTHYROXINE SODIUM 50 MCG PO TABS
150.0000 ug | ORAL_TABLET | Freq: Every day | ORAL | Status: DC
  Administered 2019-05-20 – 2019-05-21 (×2): 150 ug via ORAL
  Filled 2019-05-19: qty 1
  Filled 2019-05-19: qty 3

## 2019-05-19 MED ORDER — FLUTICASONE-UMECLIDIN-VILANT 100-62.5-25 MCG/INH IN AEPB: 1.0000 | INHALATION_SPRAY | Freq: Every day | RESPIRATORY_TRACT | Status: DC

## 2019-05-19 MED ORDER — SODIUM CHLORIDE 0.9 % IV BOLUS
500.0000 mL | Freq: Once | INTRAVENOUS | Status: AC
  Administered 2019-05-19: 500 mL via INTRAVENOUS

## 2019-05-19 MED ORDER — MAGNESIUM HYDROXIDE 400 MG/5ML PO SUSP: 30.0000 mL | Freq: Every day | ORAL | Status: DC | PRN

## 2019-05-19 MED ORDER — GUAIFENESIN-DM 100-10 MG/5ML PO SYRP
5.0000 mL | ORAL_SOLUTION | ORAL | Status: DC | PRN
  Filled 2019-05-19: qty 5

## 2019-05-19 NOTE — ED Triage Notes (Signed)
Pt ambulatory to triage.  Pt wheezing.  Pt dx with covid 04/15/19.  Pt continues to have sob and a dry cough.  Pt alert  Speech clear.

## 2019-05-19 NOTE — ED Notes (Addendum)
Date and time results received: 05/19/19 21:28 PM (use smartphrase ".now" to insert current time)  Test: COVID  Critical Value: Positive  Name of Provider Notified: Dr. Sidney Ace   Orders Received? Or Actions Taken?: No new orders at this time

## 2019-05-19 NOTE — Telephone Encounter (Signed)
Patient called and states that she has had COVID-19 Jan 10 released from hospital and released from isolation.  She is calling because she has hx of asthma. She states that she is having trouble breathing with just a little activity. She states talking caused her to cough and wheeze. She has no nebulizer or rescue inhaler. No other symptoms O2 sat is 92% on one hand and 88% other hand. Per protocol patient advised to go to ER for evaluation of her symptoms. She verbalized understanding.  She states she has someone to drive her. Care advice read She verbalized understanding of all information.  Reason for Disposition . [1] Severe wheezing or coughing AND [2] doesn't have neb or inhaler available  Answer Assessment - Initial Assessment Questions 1. RESPIRATORY STATUS: "Describe your breathing?" (e.g., wheezing, shortness of breath, unable to speak, severe coughing)      SOB cough 2. ONSET: "When did this asthma attack begin?"      Thursday 3. TRIGGER: "What do you think triggered this attack?" (e.g., URI, exposure to pollen or other allergen, tobacco smoke)      unsure 4. PEAK EXPIRATORY FLOW RATE (PEFR): "Do you use a peak flow meter?" If so, ask: "What's the current peak flow? What's your personal best peak flow?"     Up to 1250 5. SEVERITY: "How bad is this attack?"    - MILD: No SOB at rest, mild SOB with walking, speaks normally in sentences, can lay down, no retractions, pulse < 100. (GREEN Zone: PEFR 80-100%)   - MODERATE: SOB at rest, SOB with minimal exertion and prefers to sit, cannot lie down flat, speaks in phrases, mild retractions, audible wheezing, pulse 100-120. (YELLOW Zone: PEFR 50-80%)    - SEVERE: Very SOB at rest, speaks in single words, struggling to breathe, sitting hunched forward, retractions, usually loud wheezing, sometimes minimal wheezing because of decreased air movement, pulse > 120. (RED Zone: PEFR < 50%).      moderate 6. MEDICATIONS (Inhaler or nebs): "What are your  asthma medications?" and "What treatments have you given so far?"    - Quick-relief: albuterol, metaproterenol, salbutamol, or other inhaled or nebulized beta-agonist medicines   - Long-term-control: steroids, cromolyn, or other anti-inflammatory medicines.     Trilogy  7. OTHER SYMPTOMS: "Do you have any other symptoms? (e.g., runny nose, chest pain, fever)     no 8. PREGNANCY: "Is there any chance you are pregnant?" "When was your last menstrual period?"   N/A  Protocols used: ASTHMA ATTACK-A-AH

## 2019-05-19 NOTE — ED Provider Notes (Signed)
[facility] Emergency Department Provider Note    First MD Initiated Contact with Patient 05/19/19 1815     (approximate)  I have reviewed the triage vital signs and the nursing notes.   HISTORY  Chief Complaint Wheezing    HPI Maria Richardson is a 59 y.o. female with the below listed past medical history and recent mission the hospital for Covid associated illness discharged home without oxygen presents the ER for worsening exertional dyspnea over the past several days.  She was okay at rest but anytime she takes only a few steps she feels like she is about to pass out due to his generalized weakness.  Does not feel like her asthma inhalers are helping at all.  She is not currently on any antibiotics.  Denies any history of congestive heart failure.  She is not on blood thinners.    Past Medical History:  Diagnosis Date  . Asthma   . Eczema   . Hypoglycemia   . Hypothyroidism   . Shortness of breath dyspnea    because of asthma  . Wears dentures    full lower   Family History  Problem Relation Age of Onset  . Diabetes Father   . Hypertension Father   . Kidney disease Father   . Colon cancer Father    Past Surgical History:  Procedure Laterality Date  . APPENDECTOMY    . COLONOSCOPY WITH PROPOFOL N/A 07/23/2015   Procedure: COLONOSCOPY WITH PROPOFOL;  Surgeon: Lucilla Lame, MD;  Location: [place];  Service: Endoscopy;  Laterality: N/A;  PLEASE LEAVE PT AT [place]     as teenager  . POLYPECTOMY  07/23/2015   Procedure: POLYPECTOMY;  Surgeon: Lucilla Lame, MD;  Location: [place];  Service: Endoscopy;;   Patient Active Problem List   Diagnosis Date Noted  . Pneumonia due to COVID-19 virus 04/22/2019  . Acute respiratory failure with hypoxemia ([place]) 04/22/2019  . Asthma 04/22/2019  . Dyslipidemia 08/04/2015  . Benign neoplasm of sigmoid colon   . Seasonal allergic rhinitis 01/07/2015  . Lichen simplex  [id number]  . History of anemia 01/07/2015  . Excess weight 01/07/2015  . 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 Leiomyoma of uterus 01/07/2015  . Calculus of gallbladder 01/07/2015  . Acquired hypothyroidism 09/24/2009  . 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 Vitamin D deficiency 05/18/2009      Prior to Admission medications   Medication Sig Start Date End Date Taking? Authorizing Provider  ascorbic acid (VITAMIN C) 500 MG tablet Take 1 tablet (500 mg total) by mouth daily. 04/25/19   Dhungel, Flonnie Overman, MD  calcipotriene (DOVONOX) 0.005 % cream APP AA BID 08/28/17   [provider]  Fluticasone-Umeclidin-Vilant (TRELEGY ELLIPTA) 100-62.5-25 MCG/INH AEPB Take 1 puff by mouth daily. 04/29/19   Steele Sizer, MD  guaiFENesin-dextromethorphan (ROBITUSSIN DM) 100-10 MG/5ML syrup Take 5 mLs by mouth every 4 (four) hours as needed for cough. 04/24/19   Dhungel, Flonnie Overman, MD  levothyroxine (SYNTHROID) 150 MCG tablet Take 150 mcg by mouth daily. 04/22/19   [provider]  zinc sulfate 220 (50 Zn) MG capsule Take 1 capsule (220 mg total) by mouth daily. 04/25/19   Dhungel, Flonnie Overman, MD    Allergies Codeine    Social History Social History   Tobacco Use  . Smoking status: Former Smoker    Packs/day: 0.50    Years: 30.00    Pack years: 15.00    Types: Cigarettes  Quit date: 04/28/1999    Years since quitting: 20.0  . Smokeless tobacco: Never Used  Substance Use Topics  . Alcohol use: No    Alcohol/week: 0.0 standard drinks  . Drug use: No    Review of Systems Patient denies headaches, rhinorrhea, blurry vision, numbness, shortness of breath, chest pain, edema, cough, abdominal pain, nausea, vomiting, diarrhea, dysuria, fevers, rashes or hallucinations unless otherwise stated above in HPI. ____________________________________________   PHYSICAL EXAM:  VITAL SIGNS: Vitals:   05/19/19 1823 05/19/19 1826  BP:    Pulse: (!) 106 (!) 104  Resp: (!) 26 17  Temp:      SpO2: 97% 93%    Constitutional: Alert and oriented.  Eyes: Conjunctivae are normal.  Head: Atraumatic. Nose: No congestion/rhinnorhea. Mouth/Throat: Mucous membranes are moist.   Neck: No stridor. Painless ROM.  Cardiovascular: Normal rate, regular rhythm. Grossly normal heart sounds.  Good peripheral circulation. Respiratory: Normal respiratory effort.  No retractions. Lungs CTAB. Gastrointestinal: Soft and nontender. No distention. No abdominal bruits. No CVA tenderness. Genitourinary:  Musculoskeletal: No lower extremity tenderness nor edema.  No joint effusions. Neurologic:  Normal speech and language. No gross focal neurologic deficits are appreciated. No facial droop Skin:  Skin is warm, dry and intact. No rash noted. Psychiatric: Mood and affect are normal. Speech and behavior are normal.  ____________________________________________   LABS (all labs ordered are listed, but only abnormal results are displayed)  Results for orders placed or performed during the hospital encounter of 05/19/19 (from the past 24 hour(s))  CBC     Status: Abnormal   Collection Time: 05/19/19  5:29 PM  Result Value Ref Range   WBC 9.6 4.0 - 10.5 K/uL   RBC 4.95 3.87 - 5.11 MIL/uL   Hemoglobin 14.5 12.0 - 15.0 g/dL   HCT 44.9 36.0 - 46.0 %   MCV 90.7 80.0 - 100.0 fL   MCH 29.3 26.0 - 34.0 pg   MCHC 32.3 30.0 - 36.0 g/dL   RDW 16.8 (H) 11.5 - 15.5 %   Platelets 214 150 - 400 K/uL   nRBC 0.0 0.0 - 0.2 %  Comprehensive metabolic panel     Status: Abnormal   Collection Time: 05/19/19  5:29 PM  Result Value Ref Range   Sodium 136 135 - 145 mmol/L   Potassium 4.4 3.5 - 5.1 mmol/L   Chloride 102 98 - 111 mmol/L   CO2 25 22 - 32 mmol/L   Glucose, Bld 118 (H) 70 - 99 mg/dL   BUN 20 6 - 20 mg/dL   Creatinine, Ser 0.92 0.44 - 1.00 mg/dL   Calcium 9.4 8.9 - 10.3 mg/dL   Total Protein 7.7 6.5 - 8.1 g/dL   Albumin 3.4 (L) 3.5 - 5.0 g/dL   AST 64 (H) 15 - 41 U/L   ALT 262 (H) 0 - 44 U/L    Alkaline Phosphatase 176 (H) 38 - 126 U/L   Total Bilirubin 0.5 0.3 - 1.2 mg/dL   GFR calc non Af Amer >60 >60 mL/min   GFR calc Af Amer >60 >60 mL/min   Anion gap 9 5 - 15  Troponin I (High Sensitivity)     Status: None   Collection Time: 05/19/19  5:29 PM  Result Value Ref Range   Troponin I (High Sensitivity) 2 <18 ng/L   ____________________________________________  EKG My review and personal interpretation at Time: 17:25   Indication: sob  Rate: 110  Rhythm: sinus Axis: normal  Other: normal intervals,  no stemi ____________________________________________  RADIOLOGY  I personally reviewed all radiographic images ordered to evaluate for the above acute complaints and reviewed radiology reports and findings.  These findings were personally discussed with the patient.  Please see medical record for radiology report.  ____________________________________________   PROCEDURES  Procedure(s) performed:  .Critical Care Performed by: Merlyn Lot, MD Authorized by: Merlyn Lot, MD   Critical care provider statement:    Critical care time (minutes):  30   Critical care was necessary to treat or prevent imminent or life-threatening deterioration of the following conditions:  Respiratory failure   Critical care was time spent personally by me on the following activities:  Discussions with consultants, evaluation of patient's response to treatment, examination of patient, ordering and performing treatments and interventions, ordering and review of laboratory studies, ordering and review of radiographic studies, pulse oximetry, re-evaluation of patient's condition, obtaining history from patient or surrogate and review of old charts      Critical Care performed: yes   ____________________________________________   INITIAL IMPRESSION / ASSESSMENT AND PLAN / ED COURSE  Pertinent labs & imaging results that were available during my care of the patient were reviewed by me  and considered in my medical decision making (see chart for details).   DDX: fibrosis, asthma, chf, pna, pe, long covid  JEFFREY GEESAMAN is a 59 y.o. who presents to the ED with symptoms as described above.  Patient's presentation complicated by recent Covid illness.  She does not have significant wheezing on exam but unfortunately even with short exertion becomes very tachycardic and hypoxic down to the mid to low 80s on room air.  She is not on home oxygen.  Does have some scarring on her chest x-ray which may be the cause of her symptoms but as she is recently post Covid will order CTA to exclude PE.  Clinical Course as of May 18 2044  Mon May 19, 2019  2001 Without evidence of PE but does have persistent low O2 sats now even at rest to 88% on room air.  Do feel patient will require hospitalization.  May qualify or benefit from home oxygen.  Have discussed with the patient and available family all diagnostics and treatments performed thus far and all questions were answered to the best of my ability. The patient demonstrates understanding and agreement with plan.    [PR]    Clinical Course User Index [PR] Merlyn Lot, MD    The patient was evaluated in Emergency Department today for the symptoms described in the history of present illness. He/she was evaluated in the context of the global COVID-19 pandemic, which necessitated consideration that the patient might be at risk for infection with the SARS-CoV-2 virus that causes COVID-19. Institutional protocols and algorithms that pertain to the evaluation of patients at risk for COVID-19 are in a state of rapid change based on information released by regulatory bodies including the CDC and federal and state organizations. These policies and algorithms were followed during the patient's care in the ED.  As part of my medical decision making, I reviewed the following data within the [place] notes reviewed and  incorporated, Labs reviewed, notes from prior ED visits and [place] Controlled Substance Database   ____________________________________________   FINAL CLINICAL IMPRESSION(S) / ED DIAGNOSES  Final diagnoses:  Acute respiratory failure with hypoxia ([place])  COVID-19 virus infection      NEW MEDICATIONS STARTED DURING THIS VISIT:  New Prescriptions   No medications  on file     Note:  This document was prepared using Dragon voice recognition software and may include unintentional dictation errors.    Merlyn Lot, MD 05/19/19 2046

## 2019-05-19 NOTE — H&P (Signed)
[facility] NAME: Maria Richardson    MR#:  [id number]  DATE OF BIRTH:  1960-09-04  DATE OF ADMISSION:  05/19/2019  PRIMARY CARE PHYSICIAN: Steele Sizer, MD   REQUESTING/REFERRING PHYSICIAN: Merlyn Lot, MD  CHIEF COMPLAINT:   Chief Complaint  Patient presents with  . Wheezing    HISTORY OF PRESENT ILLNESS:  Maria Richardson  is a 59 y.o. female with a known history of asthma and hypothyroidism as well as COVID-19 diagnosed on 1/21, who presented to the emergency room with acute onset of worsening dyspnea mainly on exertion with only occasional mild dry cough with occasional wheezing.  She has been exposed to cold weather prior to beginning of her symptoms. She denies any fever or chills.  No nausea vomiting or abdominal pain.  No chest pain or palpitations.  She was diagnosed with COVID-19 on 04/18/2019 and was admitted for worsening symptoms over few days as stated 4 days in the hospital.  She did not require home O2.  Today however her pulse oximetry has been in the 90s on 2 L O2 by nasal cannula and on room air at rest was 88%.  When she came to the ER, temperature was 99.5 and heart rate 113 and respiratory rate was 26 with pulse oximetry of 93% on room air and later dropped to 88% as mentioned above.  Labs revealed unremarkable CBC and CMP.  COVID-19 PCR came back positive and influenza antigens were negative. Two-view chest x-ray showed postinflammatory scarring with no acute airspace disease.  Chest CT angio revealed scattered bilateral reticular opacities with subtle associated groundglass opacities likely representing resolving COVID-19 pneumonia.  It showed emphysema and bilateral pulmonary nodules measuring approximately 7 mm with recommendation for follow-up chest CT in 3 to 6 months if stable at that time then future CT at 18 to 24 months that is optional for low risk patients but recommended for high risk patients.  The patient was given  125 mg of IV Solu-Medrol, 2 puffs of albuterol MDI and 500 mill IV normal saline bolus.  She will be admitted to an observation medical monitored bed for further evaluation and management.  PAST MEDICAL HISTORY:   Past Medical History:  Diagnosis Date  . Asthma   . Eczema   . Hypoglycemia   . Hypothyroidism   . Shortness of breath dyspnea    because of asthma  . Wears dentures    full lower    PAST SURGICAL HISTORY:   Past Surgical History:  Procedure Laterality Date  . APPENDECTOMY    . COLONOSCOPY WITH PROPOFOL N/A 07/23/2015   Procedure: COLONOSCOPY WITH PROPOFOL;  Surgeon: Lucilla Lame, MD;  Location: [facility];  Service: Endoscopy;  Laterality: N/A;  PLEASE LEAVE PT AT [place]     as teenager  . POLYPECTOMY  07/23/2015   Procedure: POLYPECTOMY;  Surgeon: Lucilla Lame, MD;  Location: [facility];  Service: Endoscopy;;    SOCIAL HISTORY:   Social History   Tobacco Use  . Smoking status: Former Smoker    Packs/day: 0.50    Years: 30.00    Pack years: 15.00    Types: Cigarettes    Quit date: 04/28/1999    Years since quitting: 20.0  . Smokeless tobacco: Never Used  Substance Use Topics  . Alcohol use: No    Alcohol/week: 0.0 standard drinks    FAMILY HISTORY:   Family History  Problem Relation Age  of Onset  . Diabetes Father   . Hypertension Father   . Kidney disease Father   . Colon cancer Father     DRUG ALLERGIES:   Allergies  Allergen Reactions  . Codeine Nausea And Vomiting    REVIEW OF SYSTEMS:   ROS As per history of present illness. All pertinent systems were reviewed above. Constitutional,  HEENT, cardiovascular, respiratory, GI, GU, musculoskeletal, neuro, psychiatric, endocrine,  integumentary and hematologic systems were reviewed and are otherwise  negative/unremarkable except for positive findings mentioned above in the HPI.   MEDICATIONS AT HOME:   Prior to Admission medications   Medication Sig  Start Date End Date Taking? Authorizing Provider  ascorbic acid (VITAMIN C) 500 MG tablet Take 1 tablet (500 mg total) by mouth daily. 04/25/19   Dhungel, Flonnie Overman, MD  calcipotriene (DOVONOX) 0.005 % cream APP AA BID 08/28/17   [provider]  Fluticasone-Umeclidin-Vilant (TRELEGY ELLIPTA) 100-62.5-25 MCG/INH AEPB Take 1 puff by mouth daily. 04/29/19   Steele Sizer, MD  guaiFENesin-dextromethorphan (ROBITUSSIN DM) 100-10 MG/5ML syrup Take 5 mLs by mouth every 4 (four) hours as needed for cough. 04/24/19   Dhungel, Flonnie Overman, MD  levothyroxine (SYNTHROID) 150 MCG tablet Take 150 mcg by mouth daily. 04/22/19   [provider]  zinc sulfate 220 (50 Zn) MG capsule Take 1 capsule (220 mg total) by mouth daily. 04/25/19   Dhungel, Flonnie Overman, MD      VITAL SIGNS:  Blood pressure 107/88, pulse (!) 104, temperature 99.5 F (37.5 C), temperature source Oral, resp. rate 17, height 5' (1.524 m), weight 67.1 kg, SpO2 93 %.  PHYSICAL EXAMINATION:  Physical Exam  GENERAL:  59 y.o.-year-old African-American patient lying in the bed with no acute distress.  EYES: Pupils equal, round, reactive to light and accommodation. No scleral icterus. Extraocular muscles intact.  HEENT: Head atraumatic, normocephalic. Oropharynx and nasopharynx clear.  NECK:  Supple, no jugular venous distention. No thyroid enlargement, no tenderness.  LUNGS: Slightly diminished bibasal breath sounds with bibasal crackles. CARDIOVASCULAR: Regular rate and rhythm, S1, S2 normal. No murmurs, rubs, or gallops.  ABDOMEN: Soft, nondistended, nontender. Bowel sounds present. No organomegaly or mass.  EXTREMITIES: No pedal edema, cyanosis, or clubbing.  NEUROLOGIC: Cranial nerves II through XII are intact. Muscle strength 5/5 in all extremities. Sensation intact. Gait not checked.  PSYCHIATRIC: The patient is alert and oriented x 3.  Normal affect and good eye contact. SKIN: No obvious rash, lesion, or ulcer.   LABORATORY  PANEL:   CBC Recent Labs  Lab 05/19/19 1729  WBC 9.6  HGB 14.5  HCT 44.9  PLT 214   ------------------------------------------------------------------------------------------------------------------  Chemistries  Recent Labs  Lab 05/19/19 1729  NA 136  K 4.4  CL 102  CO2 25  GLUCOSE 118*  BUN 20  CREATININE 0.92  CALCIUM 9.4  AST 64*  ALT 262*  ALKPHOS 176*  BILITOT 0.5   ------------------------------------------------------------------------------------------------------------------  Cardiac Enzymes No results for input(s): TROPONINI in the last 168 hours. ------------------------------------------------------------------------------------------------------------------  RADIOLOGY:  DG Chest 2 View  Result Date: 05/19/2019 CLINICAL DATA:  Shortness of breath, dry cough, previous COVID-19 EXAM: CHEST - 2 VIEW COMPARISON:  04/21/2019 FINDINGS: There is a stable cardiac silhouette. Airspace disease seen previously has resolved. There is some mild scarring in the upper lung zones. No effusion or pneumothorax. No acute bony abnormalities. IMPRESSION: 1. Postinflammatory scarring as above, no acute airspace disease. Electronically Signed   By: Randa Ngo M.D.   On: 05/19/2019 17:59   CT  Angio Chest PE W and/or Wo Contrast  Result Date: 05/19/2019 CLINICAL DATA:  Shortness of breath EXAM: CT ANGIOGRAPHY CHEST WITH CONTRAST TECHNIQUE: Multidetector CT imaging of the chest was performed using the standard protocol during bolus administration of intravenous contrast. Multiplanar CT image reconstructions and MIPs were obtained to evaluate the vascular anatomy. CONTRAST:  31mL OMNIPAQUE IOHEXOL 350 MG/ML SOLN COMPARISON:  None. FINDINGS: Cardiovascular: Evaluation is somewhat limited by respiratory motion artifact.Given this limitation, no acute pulmonary embolism was detected. The main pulmonary artery is within normal limits for size. There is no CT evidence of acute right heart  strain. The visualized aorta is normal. Heart size is normal, without pericardial effusion. Mediastinum/Nodes: --No mediastinal or hilar lymphadenopathy. --No axillary lymphadenopathy. --No supraclavicular lymphadenopathy. --Normal thyroid gland. --The esophagus is unremarkable Lungs/Pleura: Emphysematous changes are noted bilaterally. There are scattered bilateral reticular airspace opacities. There is a 5 mm pulmonary nodule in the right upper lobe (axial series 5, image 63). There is a perifissural nodule measuring 4 mm in the right upper lobe (axial series 5, image 108). There is a 7 mm perifissural pulmonary nodule involving the right lower lobe (axial series 5, image 114). There is a 4 mm pulmonary nodule in the peripheral left lower lobe (axial series 5, image 132). There is no pneumothorax. No large pleural effusion. Upper Abdomen: No acute abnormality. Musculoskeletal: No chest wall abnormality. No acute or significant osseous findings. Review of the MIP images confirms the above findings. IMPRESSION: 1. Evaluation for pulmonary emboli is limited by respiratory motion artifact. Given this limitation, no acute pulmonary embolism was detected. 2. Scattered bilateral reticular airspace opacities with subtle associated ground-glass opacities likely represents the patient's resolving COVID-19 pneumonia. 3. Emphysema. 4. Bilateral pulmonary nodules measuring up to approximately 7 mm. Non-contrast chest CT at 3-6 months is recommended. If the nodules are stable at time of repeat CT, then future CT at 18-24 months (from today's scan) is considered optional for low-risk patients, but is recommended for high-risk patients. This recommendation follows the consensus statement: Guidelines for Management of Incidental Pulmonary Nodules Detected on CT Images: From the Fleischner Society 2017; Radiology 2017; 284:228-243. Emphysema (ICD10-J43.9). Electronically Signed   By: Constance Holster M.D.   On: 05/19/2019 19:29       IMPRESSION AND PLAN:   1.  Acute hypoxemia.  This could be residual from postinflammatory changes from COVID-19 pneumonia.  It could be related to asthma emphysema exacerbation with recent cold exposure.  The patient will be admitted to an observation medical monitored bed.  She is over 27 days from her first positive test last month and therefore no isolation is required at this time.  Will obtain inflammatory markers.  We will continue steroid therapy as well as scheduled and as needed albuterol MDI.  While assess her O2 at rest and on exertion to qualify her for home O2 in a.m.  2.  COPD/asthma.  Place the patient on steroids for now as well as scheduled and as needed albuterol MDI.  We will hold off Incruse and Breo Ellipta for now.  3.  Hypothyroidism.  We will continue Synthroid and check TSH.  4.  DVT prophylaxis.  Subcutaneous Lovenox.    All the records are reviewed and case discussed with ED provider. The plan of care was discussed in details with the patient (and family). I answered all questions. The patient agreed to proceed with the above mentioned plan. Further management will depend upon hospital course.   CODE STATUS: Full  code  TOTAL TIME TAKING CARE OF THIS PATIENT: 50 minutes.    Christel Mormon M.D on 05/19/2019 at 9:04 PM  [facility]   From 7 PM-7 AM, contact night-coverage [email or web address]  CC: Primary care physician; Steele Sizer, MD   Note: This dictation was prepared with Dragon dictation along with smaller phrase technology. Any transcriptional errors that result from this process are unintentional.

## 2019-05-19 NOTE — ED Notes (Signed)
After walking from waiting room to treatment room pt oxygen saturation decreased to 87%, pt also experienced increased work of breathing and wheezing. After sitting on bed for a couple minutes pt oxygen slowly increased back to 95% on room air.

## 2019-05-20 ENCOUNTER — Encounter: Payer: Self-pay | Admitting: Internal Medicine

## 2019-05-20 DIAGNOSIS — J302 Other seasonal allergic rhinitis: Secondary | ICD-10-CM | POA: Diagnosis present

## 2019-05-20 DIAGNOSIS — Z841 Family history of disorders of kidney and ureter: Secondary | ICD-10-CM | POA: Diagnosis not present

## 2019-05-20 DIAGNOSIS — R918 Other nonspecific abnormal finding of lung field: Secondary | ICD-10-CM | POA: Diagnosis present

## 2019-05-20 DIAGNOSIS — Z87891 Personal history of nicotine dependence: Secondary | ICD-10-CM | POA: Diagnosis not present

## 2019-05-20 DIAGNOSIS — L280 Lichen simplex chronicus: Secondary | ICD-10-CM | POA: Diagnosis present

## 2019-05-20 DIAGNOSIS — Z79899 Other long term (current) drug therapy: Secondary | ICD-10-CM | POA: Diagnosis not present

## 2019-05-20 DIAGNOSIS — J9601 Acute respiratory failure with hypoxia: Principal | ICD-10-CM

## 2019-05-20 DIAGNOSIS — E039 Hypothyroidism, unspecified: Secondary | ICD-10-CM | POA: Diagnosis present

## 2019-05-20 DIAGNOSIS — Z8701 Personal history of pneumonia (recurrent): Secondary | ICD-10-CM | POA: Diagnosis not present

## 2019-05-20 DIAGNOSIS — R0902 Hypoxemia: Secondary | ICD-10-CM | POA: Diagnosis present

## 2019-05-20 DIAGNOSIS — Z833 Family history of diabetes mellitus: Secondary | ICD-10-CM | POA: Diagnosis not present

## 2019-05-20 DIAGNOSIS — J439 Emphysema, unspecified: Secondary | ICD-10-CM | POA: Diagnosis present

## 2019-05-20 DIAGNOSIS — U071 COVID-19: Secondary | ICD-10-CM | POA: Diagnosis not present

## 2019-05-20 DIAGNOSIS — B948 Sequelae of other specified infectious and parasitic diseases: Secondary | ICD-10-CM | POA: Diagnosis not present

## 2019-05-20 DIAGNOSIS — Z885 Allergy status to narcotic agent status: Secondary | ICD-10-CM | POA: Diagnosis not present

## 2019-05-20 DIAGNOSIS — Z9981 Dependence on supplemental oxygen: Secondary | ICD-10-CM | POA: Diagnosis not present

## 2019-05-20 DIAGNOSIS — Z9049 Acquired absence of other specified parts of digestive tract: Secondary | ICD-10-CM | POA: Diagnosis not present

## 2019-05-20 DIAGNOSIS — Z79890 Hormone replacement therapy (postmenopausal): Secondary | ICD-10-CM | POA: Diagnosis not present

## 2019-05-20 DIAGNOSIS — Z800 Family history of malignant neoplasm of digestive organs: Secondary | ICD-10-CM | POA: Diagnosis not present

## 2019-05-20 DIAGNOSIS — Z8249 Family history of ischemic heart disease and other diseases of the circulatory system: Secondary | ICD-10-CM | POA: Diagnosis not present

## 2019-05-20 DIAGNOSIS — J432 Centrilobular emphysema: Secondary | ICD-10-CM | POA: Diagnosis not present

## 2019-05-20 LAB — COMPREHENSIVE METABOLIC PANEL
ALT: 215 U/L — ABNORMAL HIGH (ref 0–44)
AST: 57 U/L — ABNORMAL HIGH (ref 15–41)
Albumin: 2.9 g/dL — ABNORMAL LOW (ref 3.5–5.0)
Alkaline Phosphatase: 159 U/L — ABNORMAL HIGH (ref 38–126)
Anion gap: 9 (ref 5–15)
BUN: 19 mg/dL (ref 6–20)
CO2: 22 mmol/L (ref 22–32)
Calcium: 9 mg/dL (ref 8.9–10.3)
Chloride: 104 mmol/L (ref 98–111)
Creatinine, Ser: 0.87 mg/dL (ref 0.44–1.00)
GFR calc Af Amer: >60 mL/min (ref >60)
GFR calc non Af Amer: >60 mL/min (ref >60)
Glucose, Bld: 241 mg/dL — ABNORMAL HIGH (ref 70–99)
Potassium: 4.5 mmol/L (ref 3.5–5.1)
Sodium: 135 mmol/L (ref 135–145)
Total Bilirubin: 0.6 mg/dL (ref 0.3–1.2)
Total Protein: 7 g/dL (ref 6.5–8.1)

## 2019-05-20 LAB — CBC
HCT: 41.2 % (ref 36.0–46.0)
Hemoglobin: 13.1 g/dL (ref 12.0–15.0)
MCH: 28.7 pg (ref 26.0–34.0)
MCHC: 31.8 g/dL (ref 30.0–36.0)
MCV: 90.4 fL (ref 80.0–100.0)
Platelets: 187 10*3/uL (ref 150–400)
RBC: 4.56 MIL/uL (ref 3.87–5.11)
RDW: 16.8 % — ABNORMAL HIGH (ref 11.5–15.5)
WBC: 8.9 10*3/uL (ref 4.0–10.5)
nRBC: 0 % (ref 0.0–0.2)

## 2019-05-20 LAB — LACTATE DEHYDROGENASE: LDH: 176 U/L (ref 98–192)

## 2019-05-20 LAB — PROCALCITONIN: Procalcitonin: <0.1 ng/mL

## 2019-05-20 LAB — C-REACTIVE PROTEIN: CRP: 6.7 mg/dL — ABNORMAL HIGH (ref <1.0)

## 2019-05-20 LAB — TSH: TSH: 0.346 u[IU]/mL — ABNORMAL LOW (ref 0.350–4.500)

## 2019-05-20 LAB — FERRITIN: Ferritin: 659 ng/mL — ABNORMAL HIGH (ref 11–307)

## 2019-05-20 MED ORDER — METHYLPREDNISOLONE SODIUM SUCC 125 MG IJ SOLR
60.0000 mg | INTRAMUSCULAR | Status: DC
  Administered 2019-05-21: 60 mg via INTRAVENOUS
  Filled 2019-05-20: qty 2

## 2019-05-20 NOTE — Telephone Encounter (Signed)
Left a message to check on patient since going to ER for severe wheezing and coughing.

## 2019-05-20 NOTE — ED Notes (Signed)
Lunch tray provided; pt able to feed self. 

## 2019-05-20 NOTE — Progress Notes (Addendum)
[facility] NAME: Maria Richardson    MR#:  [id number]  DATE OF BIRTH:  07/15/1960  SUBJECTIVE:  feeling better today. No wheezing. She thinks oxygen will help her at home. History of smoking for 21 years  REVIEW OF SYSTEMS:   Review of Systems  Constitutional: Negative for chills, fever and weight loss.  HENT: Negative for ear discharge, ear pain and nosebleeds.   Eyes: Negative for blurred vision, pain and discharge.  Respiratory: Positive for shortness of breath. Negative for sputum production, wheezing and stridor.   Cardiovascular: Negative for chest pain, palpitations, orthopnea and PND.  Gastrointestinal: Negative for abdominal pain, diarrhea, nausea and vomiting.  Genitourinary: Negative for frequency and urgency.  Musculoskeletal: Negative for back pain and joint pain.  Neurological: Negative for sensory change, speech change, focal weakness and weakness.  Psychiatric/Behavioral: Negative for depression and hallucinations. The patient is not nervous/anxious.    Tolerating Diet:yes Tolerating PT: not needed  DRUG ALLERGIES:   Allergies  Allergen Reactions  . Codeine Nausea And Vomiting    VITALS:  Blood pressure 119/78, pulse 72, temperature 99.5 F (37.5 C), temperature source Oral, resp. rate 19, height 5' (1.524 m), weight 67.1 kg, SpO2 97 %.  PHYSICAL EXAMINATION:   Physical Exam  GENERAL:  59 y.o.-year-old patient lying in the bed with no acute distress.  EYES: Pupils equal, round, reactive to light and accommodation. No scleral icterus.   HEENT: Head atraumatic, normocephalic. Oropharynx and nasopharynx clear.  NECK:  Supple, no jugular venous distention. No thyroid enlargement, no tenderness.  LUNGS: Normal breath sounds bilaterally, no wheezing, rales, rhonchi. No use of accessory muscles of respiration.  CARDIOVASCULAR: S1, S2 normal. No murmurs, rubs, or gallops.  ABDOMEN: Soft, nontender, nondistended.  Bowel sounds present. No organomegaly or mass.  EXTREMITIES: No cyanosis, clubbing or edema b/l.    NEUROLOGIC: Cranial nerves II through XII are intact. No focal Motor or sensory deficits b/l.   PSYCHIATRIC:  patient is alert and oriented x 3.  SKIN: No obvious rash, lesion, or ulcer.   LABORATORY PANEL:  CBC Recent Labs  Lab 05/20/19 0157  WBC 8.9  HGB 13.1  HCT 41.2  PLT 187    Chemistries  Recent Labs  Lab 05/20/19 0157  NA 135  K 4.5  CL 104  CO2 22  GLUCOSE 241*  BUN 19  CREATININE 0.87  CALCIUM 9.0  AST 57*  ALT 215*  ALKPHOS 159*  BILITOT 0.6   Cardiac Enzymes No results for input(s): TROPONINI in the last 168 hours. RADIOLOGY:  DG Chest 2 View  Result Date: 05/19/2019 CLINICAL DATA:  Shortness of breath, dry cough, previous COVID-19 EXAM: CHEST - 2 VIEW COMPARISON:  04/21/2019 FINDINGS: There is a stable cardiac silhouette. Airspace disease seen previously has resolved. There is some mild scarring in the upper lung zones. No effusion or pneumothorax. No acute bony abnormalities. IMPRESSION: 1. Postinflammatory scarring as above, no acute airspace disease. Electronically Signed   By: Randa Ngo M.D.   On: 05/19/2019 17:59   CT Angio Chest PE W and/or Wo Contrast  Result Date: 05/19/2019 CLINICAL DATA:  Shortness of breath EXAM: CT ANGIOGRAPHY CHEST WITH CONTRAST TECHNIQUE: Multidetector CT imaging of the chest was performed using the standard protocol during bolus administration of intravenous contrast. Multiplanar CT image reconstructions and MIPs were obtained to evaluate the vascular anatomy. CONTRAST:  15mL OMNIPAQUE IOHEXOL 350 MG/ML SOLN COMPARISON:  None. FINDINGS: Cardiovascular: Evaluation is  somewhat limited by respiratory motion artifact.Given this limitation, no acute pulmonary embolism was detected. The main pulmonary artery is within normal limits for size. There is no CT evidence of acute right heart strain. The visualized aorta is normal. Heart  size is normal, without pericardial effusion. Mediastinum/Nodes: --No mediastinal or hilar lymphadenopathy. --No axillary lymphadenopathy. --No supraclavicular lymphadenopathy. --Normal thyroid gland. --The esophagus is unremarkable Lungs/Pleura: Emphysematous changes are noted bilaterally. There are scattered bilateral reticular airspace opacities. There is a 5 mm pulmonary nodule in the right upper lobe (axial series 5, image 63). There is a perifissural nodule measuring 4 mm in the right upper lobe (axial series 5, image 108). There is a 7 mm perifissural pulmonary nodule involving the right lower lobe (axial series 5, image 114). There is a 4 mm pulmonary nodule in the peripheral left lower lobe (axial series 5, image 132). There is no pneumothorax. No large pleural effusion. Upper Abdomen: No acute abnormality. Musculoskeletal: No chest wall abnormality. No acute or significant osseous findings. Review of the MIP images confirms the above findings. IMPRESSION: 1. Evaluation for pulmonary emboli is limited by respiratory motion artifact. Given this limitation, no acute pulmonary embolism was detected. 2. Scattered bilateral reticular airspace opacities with subtle associated ground-glass opacities likely represents the patient's resolving COVID-19 pneumonia. 3. Emphysema. 4. Bilateral pulmonary nodules measuring up to approximately 7 mm. Non-contrast chest CT at 3-6 months is recommended. If the nodules are stable at time of repeat CT, then future CT at 18-24 months (from today's scan) is considered optional for low-risk patients, but is recommended for high-risk patients. This recommendation follows the consensus statement: Guidelines for Management of Incidental Pulmonary Nodules Detected on CT Images: From the Fleischner Society 2017; Radiology 2017; 284:228-243. Emphysema (ICD10-J43.9). Electronically Signed   By: Constance Holster M.D.   On: 05/19/2019 19:29   ASSESSMENT AND PLAN:   Maria Richardson  is a  59 y.o. female with a known history of asthma and hypothyroidism as well as COVID-19 diagnosed on 1/21, who presented to the emergency room with acute onset of worsening dyspnea mainly on exertion with only occasional mild dry cough with occasional wheezing.  She has been exposed to cold weather prior to beginning of her symptoms.Today however her pulse oximetry has been in the 90s on 2 L O2 by nasal cannula and on room air at rest was 88%.  1.  Acute battery failure hypoxemia   -This could be residual from postinflammatory changes from COVID-19 pneumonia and  could be related to asthma emphysema exacerbation with recent cold exposure.  - She is over 27 days from her first positive test last month and therefore no isolation is required at this time.  - continue steroid therapy as well as scheduled and as needed albuterol MDI.  - assess her O2 at rest and on exertion to qualify her for home O2 -patient denies any symptoms. She is overall feeling better.  2.  COPD/asthma.  Place the patient on steroids for now as well as scheduled and as needed albuterol MDI.    3.  Hypothyroidism.   continue Synthroid   4.  DVT prophylaxis.  Subcutaneous Lovenox.  TOC for oxygen and nebs if pt qualifies  Procedures: Family communication : patient Consults : Discharge Disposition : home tomorrow CODE STATUS: full DVT Prophylaxis : Lovenox Barriers to discharge:  TOTAL TIME TAKING CARE OF THIS PATIENT: *30* minutes.  >50% time spent on counselling and coordination of care  Note: This dictation was prepared with  Dragon dictation along with smaller Company secretary. Any transcriptional errors that result from this process are unintentional.  Fritzi Mandes M.D    [facility]   CC: Primary care physician; Steele Sizer, MDPatient ID: [name], female   DOB: 01/13/1961, 59 y.o.   MRN: [id number]

## 2019-05-20 NOTE — Plan of Care (Signed)

## 2019-05-20 NOTE — ED Notes (Signed)
Pt provided breakfast tray; able to feed self. 

## 2019-05-20 NOTE — Telephone Encounter (Addendum)
Pt went to ED at [facility] and as of this am is still a pt there.  Routing information to [place].

## 2019-05-21 ENCOUNTER — Telehealth: Payer: Self-pay

## 2019-05-21 DIAGNOSIS — J432 Centrilobular emphysema: Secondary | ICD-10-CM

## 2019-05-21 DIAGNOSIS — U071 COVID-19: Secondary | ICD-10-CM

## 2019-05-21 LAB — C-REACTIVE PROTEIN: CRP: 2 mg/dL — ABNORMAL HIGH (ref <1.0)

## 2019-05-21 MED ORDER — PREDNISONE 10 MG (21) PO TBPK: ORAL_TABLET | ORAL | 0 refills | Status: DC

## 2019-05-21 MED ORDER — ALBUTEROL SULFATE HFA 108 (90 BASE) MCG/ACT IN AERS: 2.0000 | INHALATION_SPRAY | Freq: Four times a day (QID) | RESPIRATORY_TRACT | 1 refills | Status: DC | PRN

## 2019-05-21 NOTE — TOC Initial Note (Signed)
Transition of Care [facility]) - Initial/Assessment Note    Patient Details  Name: Maria Richardson MRN: [id number] Date of Birth: 1960/07/16  [facility] of [facility]) CM/SW Contact:    Elease Hashimoto, LCSW Phone Number: 05/21/2019, 10:12 AM  Clinical Narrative:    Met with pt to discuss discharge needs. She is feeling much better now she is on O2. She struggles without it. Discussed possibility of needing at DC, but will need to meet criteria for insurance to cover it. She reports she looked into it privately and can not afford it. She lives with her husband who works on one week and off one week. She is not employed outside the home and also assists with the care of her 59 yo father. She was independent prior to admission and drives. Will follow along in case need for Home O2 arises. Will ask bedside RN to check her O2 sats, to see if would be eligible for home O2               Expected Discharge Plan: Home/Self Care Barriers to Discharge: Continued Medical Work up   Patient Goals and CMS Choice Patient states their goals for this hospitalization and ongoing recovery are:: My breathing is much better now I am on oxygen.      Expected Discharge Plan and Services Expected Discharge Plan: Home/Self Care In-house Referral: Clinical Social Work     Living arrangements for the past 2 months: Single Family Home                                      Prior Living Arrangements/Services Living arrangements for the past 2 months: Single Family Home Lives with:: Spouse                   Activities of Daily Living Home Assistive Devices/Equipment: None ADL Screening (condition at time of admission) Patient's cognitive ability adequate to safely complete daily activities?: Yes Is the patient deaf or have difficulty hearing?: No Does the patient have difficulty seeing, even when wearing glasses/contacts?: Yes Does the patient have difficulty concentrating, remembering, or making  decisions?: No Patient able to express need for assistance with ADLs?: Yes Does the patient have difficulty dressing or bathing?: No Independently performs ADLs?: Yes (appropriate for developmental age) Does the patient have difficulty walking or climbing stairs?: No Weakness of Legs: None Weakness of Arms/Hands: None  Permission Sought/Granted                  Emotional Assessment Appearance:: Appears stated age Attitude/Demeanor/Rapport: Engaged Affect (typically observed): Adaptable, Accepting Orientation: : Oriented to Self, Oriented to Place, Oriented to  Time, Oriented to Situation      Admission diagnosis:  Hypoxemia [R09.02] Acute respiratory failure with hypoxia (HCC) [J96.01] Acute hypoxemic respiratory failure ([place]) [J96.01] COVID-19 virus infection [U07.1] Patient Active Problem List   Diagnosis Date Noted  . Acute respiratory failure with hypoxia ([place]) 05/20/2019  . Pulmonary emphysema ([place])   . Hypoxemia 05/19/2019  . Pneumonia due to COVID-19 virus 04/22/2019  . Acute respiratory failure with hypoxemia ([place]) 04/22/2019  . Asthma 04/22/2019  . Dyslipidemia 08/04/2015  . Benign neoplasm of sigmoid colon   . Seasonal allergic rhinitis 01/07/2015  . Lichen simplex 61/95/0932  . History of anemia 01/07/2015  . Excess weight 01/07/2015  . 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 Leiomyoma of uterus 01/07/2015  . Calculus of  gallbladder 01/07/2015  . Hypothyroidism 09/24/2009  . 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 Vitamin D deficiency 05/18/2009   PCP:  Steele Sizer, MD Pharmacy:   [facility] [place], [place] [address] [place] [address] Phone: [phone] Fax: [id number] [place], [place] [address] [place] [address] Phone: [phone] Fax:  [phone]     Social Determinants of Health (SDOH) Interventions    Readmission Risk Interventions No flowsheet data found.

## 2019-05-21 NOTE — Progress Notes (Signed)
Patient Saturations on Room Air at Rest = 98%  Patient Saturations on Room Air while Ambulating = 88%-94%  No O2 needed.  O2 ambulation was done. Pt walked independently around the Nursing Pod 5x. Her lowest O2 was 87% but was able to bump up to 90's without the need of oxygen.

## 2019-05-21 NOTE — Telephone Encounter (Signed)
Patient states she did not have wheezing. She was just very SOB and though she just though she need to come in to open her airways back up with a nebulizer treatment. However, she went to the ER and she was hypoxic. She was told COVID can cause lung damage and scar tissue. She is still in the hospital but suppose to be released today. They have her on oxygen and she is feeling better.

## 2019-05-21 NOTE — Discharge Instructions (Signed)
COVID-19 COVID-19 is a respiratory infection that is caused by a virus called severe acute respiratory syndrome coronavirus 2 (SARS-CoV-2). The disease is also known as coronavirus disease or novel coronavirus. In some people, the virus may not cause any symptoms. In others, it may cause a serious infection. The infection can get worse quickly and can lead to complications, such as:  Pneumonia, or infection of the lungs.  Acute respiratory distress syndrome or ARDS. This is a condition in which fluid build-up in the lungs prevents the lungs from filling with air and passing oxygen into the blood.  Acute respiratory failure. This is a condition in which there is not enough oxygen passing from the lungs to the body or when carbon dioxide is not passing from the lungs out of the body.  Sepsis or septic shock. This is a serious bodily reaction to an infection.  Blood clotting problems.  Secondary infections due to bacteria or fungus.  Organ failure. This is when your body's organs stop working. The virus that causes COVID-19 is contagious. This means that it can spread from person to person through droplets from coughs and sneezes (respiratory secretions). What are the causes? This illness is caused by a virus. You may catch the virus by:  Breathing in droplets from an infected person. Droplets can be spread by a person breathing, speaking, singing, coughing, or sneezing.  Touching something, like a table or a doorknob, that was exposed to the virus (contaminated) and then touching your mouth, nose, or eyes. What increases the risk? Risk for infection You are more likely to be infected with this virus if you:  Are within 6 feet (2 meters) of a person with COVID-19.  Provide care for or live with a person who is infected with COVID-19.  Spend time in crowded indoor spaces or live in shared housing. Risk for serious illness You are more likely to become seriously ill from the virus if you:   Are 50 years of age or older. The higher your age, the more you are at risk for serious illness.  Live in a nursing home or long-term care facility.  Have cancer.  Have a long-term (chronic) disease such as: ? Chronic lung disease, including chronic obstructive pulmonary disease or asthma. ? A long-term disease that lowers your body's ability to fight infection (immunocompromised). ? Heart disease, including heart failure, a condition in which the arteries that lead to the heart become narrow or blocked (coronary artery disease), a disease which makes the heart muscle thick, weak, or stiff (cardiomyopathy). ? Diabetes. ? Chronic kidney disease. ? Sickle cell disease, a condition in which red blood cells have an abnormal "sickle" shape. ? Liver disease.  Are obese. What are the signs or symptoms? Symptoms of this condition can range from mild to severe. Symptoms may appear any time from 2 to 14 days after being exposed to the virus. They include:  A fever or chills.  A cough.  Difficulty breathing.  Headaches, body aches, or muscle aches.  Runny or stuffy (congested) nose.  A sore throat.  New loss of taste or smell. Some people may also have stomach problems, such as nausea, vomiting, or diarrhea. Other people may not have any symptoms of COVID-19. How is this diagnosed? This condition may be diagnosed based on:  Your signs and symptoms, especially if: ? You live in an area with a COVID-19 outbreak. ? You recently traveled to or from an area where the virus is common. ? You   provide care for or live with a person who was diagnosed with COVID-19. ? You were exposed to a person who was diagnosed with COVID-19.  A physical exam.  Lab tests, which may include: ? Taking a sample of fluid from the back of your nose and throat (nasopharyngeal fluid), your nose, or your throat using a swab. ? A sample of mucus from your lungs (sputum). ? Blood tests.  Imaging tests, which  may include, X-rays, CT scan, or ultrasound. How is this treated? At present, there is no medicine to treat COVID-19. Medicines that treat other diseases are being used on a trial basis to see if they are effective against COVID-19. Your health care provider will talk with you about ways to treat your symptoms. For most people, the infection is mild and can be managed at home with rest, fluids, and over-the-counter medicines. Treatment for a serious infection usually takes places in a hospital intensive care unit (ICU). It may include one or more of the following treatments. These treatments are given until your symptoms improve.  Receiving fluids and medicines through an IV.  Supplemental oxygen. Extra oxygen is given through a tube in the nose, a face mask, or a hood.  Positioning you to lie on your stomach (prone position). This makes it easier for oxygen to get into the lungs.  Continuous positive airway pressure (CPAP) or bi-level positive airway pressure (BPAP) machine. This treatment uses mild air pressure to keep the airways open. A tube that is connected to a motor delivers oxygen to the body.  Ventilator. This treatment moves air into and out of the lungs by using a tube that is placed in your windpipe.  Tracheostomy. This is a procedure to create a hole in the neck so that a breathing tube can be inserted.  Extracorporeal membrane oxygenation (ECMO). This procedure gives the lungs a chance to recover by taking over the functions of the heart and lungs. It supplies oxygen to the body and removes carbon dioxide. Follow these instructions at home: Lifestyle  If you are sick, stay home except to get medical care. Your health care provider will tell you how long to stay home. Call your health care provider before you go for medical care.  Rest at home as told by your health care provider.  Do not use any products that contain nicotine or tobacco, such as cigarettes, e-cigarettes, and  chewing tobacco. If you need help quitting, ask your health care provider.  Return to your normal activities as told by your health care provider. Ask your health care provider what activities are safe for you. General instructions  Take over-the-counter and prescription medicines only as told by your health care provider.  Drink enough fluid to keep your urine pale yellow.  Keep all follow-up visits as told by your health care provider. This is important. How is this prevented?  There is no vaccine to help prevent COVID-19 infection. However, there are steps you can take to protect yourself and others from this virus. To protect yourself:   Do not travel to areas where COVID-19 is a risk. The areas where COVID-19 is reported change often. To identify high-risk areas and travel restrictions, check the CDC travel website: wwwnc.cdc.gov/travel/notices  If you live in, or must travel to, an area where COVID-19 is a risk, take precautions to avoid infection. ? Stay away from people who are sick. ? Wash your hands often with soap and water for 20 seconds. If soap and water   are not available, use an alcohol-based hand sanitizer. ? Avoid touching your mouth, face, eyes, or nose. ? Avoid going out in public, follow guidance from your state and local health authorities. ? If you must go out in public, wear a cloth face covering or face mask. Make sure your mask covers your nose and mouth. ? Avoid crowded indoor spaces. Stay at least 6 feet (2 meters) away from others. ? Disinfect objects and surfaces that are frequently touched every day. This may include:  Counters and tables.  Doorknobs and light switches.  Sinks and faucets.  Electronics, such as phones, remote controls, keyboards, computers, and tablets. To protect others: If you have symptoms of COVID-19, take steps to prevent the virus from spreading to others.  If you think you have a COVID-19 infection, contact your health care  provider right away. Tell your health care team that you think you may have a COVID-19 infection.  Stay home. Leave your house only to seek medical care. Do not use public transport.  Do not travel while you are sick.  Wash your hands often with soap and water for 20 seconds. If soap and water are not available, use alcohol-based hand sanitizer.  Stay away from other members of your household. Let healthy household members care for children and pets, if possible. If you have to care for children or pets, wash your hands often and wear a mask. If possible, stay in your own room, separate from others. Use a different bathroom.  Make sure that all people in your household wash their hands well and often.  Cough or sneeze into a tissue or your sleeve or elbow. Do not cough or sneeze into your hand or into the air.  Wear a cloth face covering or face mask. Make sure your mask covers your nose and mouth. Where to find more information  Centers for Disease Control and Prevention: www.cdc.gov/coronavirus/2019-ncov/index.html  World Health Organization: www.who.int/health-topics/coronavirus Contact a health care provider if:  You live in or have traveled to an area where COVID-19 is a risk and you have symptoms of the infection.  You have had contact with someone who has COVID-19 and you have symptoms of the infection. Get help right away if:  You have trouble breathing.  You have pain or pressure in your chest.  You have confusion.  You have bluish lips and fingernails.  You have difficulty waking from sleep.  You have symptoms that get worse. These symptoms may represent a serious problem that is an emergency. Do not wait to see if the symptoms will go away. Get medical help right away. Call your local emergency services (911 in the [place] Do not drive yourself to the hospital. Let the emergency medical personnel know if you think you have COVID-19. Summary  COVID-19 is a  respiratory infection that is caused by a virus. It is also known as coronavirus disease or novel coronavirus. It can cause serious infections, such as pneumonia, acute respiratory distress syndrome, acute respiratory failure, or sepsis.  The virus that causes COVID-19 is contagious. This means that it can spread from person to person through droplets from breathing, speaking, singing, coughing, or sneezing.  You are more likely to develop a serious illness if you are 50 years of age or older, have a weak immune system, live in a nursing home, or have chronic disease.  There is no medicine to treat COVID-19. Your health care provider will talk with you about ways to treat your symptoms.    Take steps to protect yourself and others from infection. Wash your hands often and disinfect objects and surfaces that are frequently touched every day. Stay away from people who are sick and wear a mask if you are sick. This information is not intended to replace advice given to you by your health care provider. Make sure you discuss any questions you have with your health care provider. Document Revised: 01/17/2019 Document Reviewed: 04/25/2018 Elsevier Patient Education  2020 [employer].  

## 2019-05-21 NOTE — TOC Progression Note (Signed)
Transition of Care [facility]) - Progression Note    Patient Details  Name: Maria Richardson MRN: [id number] Date of Birth: 1960-12-05  Transition [facility]) CM/SW Contact  Jamarius Saha, Gardiner Rhyme, LCSW Phone Number: 05/21/2019, 2:15 PM  Clinical Narrative:   Pt does not qualify for home O2 due to RN ambulating in the hall and checking her O2 sats. Made pt aware of this and she reports the inhalers and nebs treatments have helped her with her breathing. Will continue to follow for any discharge needs.    Expected Discharge Plan: Home/Self Care Barriers to Discharge: Continued Medical Work up  Expected Discharge Plan and Services Expected Discharge Plan: Home/Self Care In-house Referral: Clinical Social Work     Living arrangements for the past 2 months: Single Family Home                                       Social Determinants of Health (SDOH) Interventions    Readmission Risk Interventions No flowsheet data found.

## 2019-05-24 NOTE — Discharge Summary (Signed)
Maria Richardson NAME: Maria Richardson    MR#:  [id number]  DATE OF BIRTH:  [birth year]  DATE OF ADMISSION:  05/19/2019   ADMITTING PHYSICIAN: Fritzi Mandes, MD  DATE OF DISCHARGE: 05/21/2019  6:22 PM  PRIMARY CARE PHYSICIAN: Steele Sizer, MD   ADMISSION DIAGNOSIS:  Hypoxemia [R09.02] Acute respiratory failure with hypoxia ([place]) [J96.01] Acute hypoxemic respiratory failure ([place]) [J96.01] COVID-19 virus infection [U07.1] DISCHARGE DIAGNOSIS:  Active Problems:   Richardson   COVID-19 virus infection   Hypoxemia   Acute respiratory failure with hypoxia ([place])  SECONDARY DIAGNOSIS:   Past Medical History:  Diagnosis Date  . Asthma   . Eczema   . Hypoglycemia   . Richardson   . Shortness of breath dyspnea    because of asthma  . Wears dentures    full lower   HOSPITAL COURSE:  Maria Richardson a58 y.o.femalewith a known history of asthma and Richardson as well as COVID-19 diagnosed on 1/21 admitted for hypoxia.  1. Acute resp failure hypoxemia -likely residual from postinflammatory changes from COVID-19 pneumonia and asthma/emphysema exacerbation with recent cold exposure. Now at baseline  2. COPD/asthma - stable  3. Richardson. continue Synthroid  DISCHARGE CONDITIONS:  stable CONSULTS OBTAINED:   DRUG ALLERGIES:   Allergies  Allergen Reactions  . Codeine Nausea And Vomiting   DISCHARGE MEDICATIONS:   Allergies as of 05/21/2019      Reactions   Codeine Nausea And Vomiting      Medication List    TAKE these medications   albuterol 108 (90 Base) MCG/ACT inhaler Commonly known as: VENTOLIN HFA Inhale 2 puffs into the lungs every 6 (six) hours as needed for wheezing or shortness of breath.   ascorbic acid 500 MG tablet Commonly known as: VITAMIN C Take 1 tablet (500 mg total) by mouth daily.   calcipotriene 0.005 % cream Commonly known as: DOVONOX APP AA BID   guaiFENesin-dextromethorphan  100-10 MG/5ML syrup Commonly known as: ROBITUSSIN DM Take 5 mLs by mouth every 4 (four) hours as needed for cough.   levothyroxine 150 MCG tablet Commonly known as: SYNTHROID Take 150 mcg by mouth daily.   predniSONE 10 MG (21) Tbpk tablet Commonly known as: STERAPRED UNI-PAK 21 TAB Start 60 mg p.o. daily.  Taper 10 mg daily until finished   Trelegy Ellipta 100-62.5-25 MCG/INH Aepb Generic drug: Fluticasone-Umeclidin-Vilant Take 1 puff by mouth daily.   zinc sulfate 220 (50 Zn) MG capsule Take 1 capsule (220 mg total) by mouth daily.      DISCHARGE INSTRUCTIONS:  Patient didn't qualify for O2  Patient Saturations on Room Air at Rest = 98%  Patient Saturations on Room Air while Ambulating = 88%-94%  No O2 needed.  O2 ambulation was done. Pt walked independently around the Nursing Pod 5x. Her lowest O2 was 87% but was able to bump up to 90's without the need of oxygen.  DIET:  Regular diet DISCHARGE CONDITION:  Stable ACTIVITY:  Activity as tolerated OXYGEN:  Home Oxygen: Yes.    Oxygen Delivery: room air DISCHARGE LOCATION:  home   If you experience worsening of your admission symptoms, develop shortness of breath, life threatening emergency, suicidal or homicidal thoughts you must seek medical attention immediately by calling 911 or calling your MD immediately  if symptoms less severe.  You Must read complete instructions/literature along with all the possible adverse reactions/side effects for all the Medicines you take and that have been prescribed to you. Take any new  Medicines after you have completely understood and accpet all the possible adverse reactions/side effects.   Please note  You were cared for by a hospitalist during your hospital stay. If you have any questions about your discharge medications or the care you received while you were in the hospital after you are discharged, you can call the unit and asked to speak with the hospitalist on call if  the hospitalist that took care of you is not available. Once you are discharged, your primary care physician will handle any further medical issues. Please note that NO REFILLS for any discharge medications will be authorized once you are discharged, as it is imperative that you return to your primary care physician (or establish a relationship with a primary care physician if you do not have one) for your aftercare needs so that they can reassess your need for medications and monitor your lab values.    On the day of Discharge:  VITAL SIGNS:  Blood pressure 128/69, pulse 88, temperature 98.3 F (36.8 C), temperature source Oral, resp. rate 18, height 5' (1.524 m), weight 67.1 kg, SpO2 95 %. PHYSICAL EXAMINATION:  GENERAL:  59 y.o.-year-old patient lying in the bed with no acute distress.  EYES: Pupils equal, round, reactive to light and accommodation. No scleral icterus. Extraocular muscles intact.  HEENT: Head atraumatic, normocephalic. Oropharynx and nasopharynx clear.  NECK:  Supple, no jugular venous distention. No thyroid enlargement, no tenderness.  LUNGS: Normal breath sounds bilaterally, no wheezing, rales,rhonchi or crepitation. No use of accessory muscles of respiration.  CARDIOVASCULAR: S1, S2 normal. No murmurs, rubs, or gallops.  ABDOMEN: Soft, non-tender, non-distended. Bowel sounds present. No organomegaly or mass.  EXTREMITIES: No pedal edema, cyanosis, or clubbing.  NEUROLOGIC: Cranial nerves II through XII are intact. Muscle strength 5/5 in all extremities. Sensation intact. Gait not checked.  PSYCHIATRIC: The patient is alert and oriented x 3.  SKIN: No obvious rash, lesion, or ulcer.  DATA REVIEW:   CBC Recent Labs  Lab 05/20/19 0157  WBC 8.9  HGB 13.1  HCT 41.2  PLT 187    Chemistries  Recent Labs  Lab 05/20/19 0157  NA 135  K 4.5  CL 104  CO2 22  GLUCOSE 241*  BUN 19  CREATININE 0.87  CALCIUM 9.0  AST 57*  ALT 215*  ALKPHOS 159*  BILITOT 0.6       Follow-up Information    Steele Sizer, MD. Schedule an appointment as soon as possible for a visit in 1 week(s).   Specialty: Family Medicine Contact information: [address] [place] [address] [phone]        Ottie Glazier, MD. Schedule an appointment as soon as possible for a visit in 2 week(s).   Specialty: Pulmonary Disease Contact information: [place] [address] [phone]            Management plans discussed with the patient, family and they are in agreement.  CODE STATUS: Prior   TOTAL TIME TAKING CARE OF THIS PATIENT: 45 minutes.    Max Sane M.D on 05/24/2019 at 2:14 PM  [facility]   CC: Primary care physician; Steele Sizer, MD   Note: This dictation was prepared with Dragon dictation along with smaller phrase technology. Any transcriptional errors that result from this process are unintentional.

## 2019-05-30 ENCOUNTER — Other Ambulatory Visit: Payer: Self-pay

## 2019-05-30 ENCOUNTER — Emergency Department
Admission: EM | Admit: 2019-05-30 | Discharge: 2019-05-30 | Disposition: A | Discharge status: Home / Self Care | Payer: Managed Care, Other (non HMO) | Attending: Emergency Medicine | Admitting: Emergency Medicine

## 2019-05-30 ENCOUNTER — Encounter: Payer: Self-pay | Admitting: Emergency Medicine

## 2019-05-30 DIAGNOSIS — J45909 Unspecified asthma, uncomplicated: Secondary | ICD-10-CM | POA: Insufficient documentation

## 2019-05-30 DIAGNOSIS — J449 Chronic obstructive pulmonary disease, unspecified: Secondary | ICD-10-CM | POA: Diagnosis not present

## 2019-05-30 DIAGNOSIS — K2900 Acute gastritis without bleeding: Secondary | ICD-10-CM | POA: Diagnosis not present

## 2019-05-30 DIAGNOSIS — Z8616 Personal history of COVID-19: Secondary | ICD-10-CM | POA: Diagnosis not present

## 2019-05-30 DIAGNOSIS — Z79899 Other long term (current) drug therapy: Secondary | ICD-10-CM | POA: Diagnosis not present

## 2019-05-30 DIAGNOSIS — Z87891 Personal history of nicotine dependence: Secondary | ICD-10-CM | POA: Insufficient documentation

## 2019-05-30 DIAGNOSIS — R112 Nausea with vomiting, unspecified: Secondary | ICD-10-CM | POA: Diagnosis present

## 2019-05-30 DIAGNOSIS — E039 Hypothyroidism, unspecified: Secondary | ICD-10-CM | POA: Diagnosis not present

## 2019-05-30 LAB — COMPREHENSIVE METABOLIC PANEL
ALT: 40 U/L (ref 0–44)
AST: 32 U/L (ref 15–41)
Albumin: 3.7 g/dL (ref 3.5–5.0)
Alkaline Phosphatase: 119 U/L (ref 38–126)
Anion gap: 10 (ref 5–15)
BUN: 17 mg/dL (ref 6–20)
CO2: 23 mmol/L (ref 22–32)
Calcium: 9.5 mg/dL (ref 8.9–10.3)
Chloride: 102 mmol/L (ref 98–111)
Creatinine, Ser: 0.77 mg/dL (ref 0.44–1.00)
GFR calc Af Amer: >60 mL/min (ref >60)
GFR calc non Af Amer: >60 mL/min (ref >60)
Glucose, Bld: 169 mg/dL — ABNORMAL HIGH (ref 70–99)
Potassium: 4 mmol/L (ref 3.5–5.1)
Sodium: 135 mmol/L (ref 135–145)
Total Bilirubin: 1 mg/dL (ref 0.3–1.2)
Total Protein: 7.6 g/dL (ref 6.5–8.1)

## 2019-05-30 LAB — CBC
HCT: 45.9 % (ref 36.0–46.0)
Hemoglobin: 14.3 g/dL (ref 12.0–15.0)
MCH: 29.2 pg (ref 26.0–34.0)
MCHC: 31.2 g/dL (ref 30.0–36.0)
MCV: 93.7 fL (ref 80.0–100.0)
Platelets: 385 10*3/uL (ref 150–400)
RBC: 4.9 MIL/uL (ref 3.87–5.11)
RDW: 16.6 % — ABNORMAL HIGH (ref 11.5–15.5)
WBC: 17.4 10*3/uL — ABNORMAL HIGH (ref 4.0–10.5)
nRBC: 0 % (ref 0.0–0.2)

## 2019-05-30 LAB — URINALYSIS, COMPLETE (UACMP) WITH MICROSCOPIC
Bacteria, UA: NONE SEEN
Bilirubin Urine: NEGATIVE
Glucose, UA: NEGATIVE mg/dL
Hgb urine dipstick: NEGATIVE
Ketones, ur: NEGATIVE mg/dL
Leukocytes,Ua: NEGATIVE
Nitrite: NEGATIVE
Protein, ur: NEGATIVE mg/dL
Specific Gravity, Urine: 1.017 (ref 1.005–1.030)
pH: 5 (ref 5.0–8.0)

## 2019-05-30 LAB — LIPASE, BLOOD: Lipase: 25 U/L (ref 11–51)

## 2019-05-30 MED ORDER — FAMOTIDINE 20 MG PO TABS: 20.0000 mg | ORAL_TABLET | Freq: Two times a day (BID) | ORAL | 0 refills | Status: DC

## 2019-05-30 MED ORDER — FAMOTIDINE 20 MG PO TABS
40.0000 mg | ORAL_TABLET | Freq: Once | ORAL | Status: AC
  Administered 2019-05-30: 15:00:00 40 mg via ORAL
  Filled 2019-05-30: qty 2

## 2019-05-30 MED ORDER — ONDANSETRON 4 MG PO TBDP
8.0000 mg | ORAL_TABLET | Freq: Once | ORAL | Status: AC
  Administered 2019-05-30: 15:00:00 8 mg via ORAL
  Filled 2019-05-30: qty 2

## 2019-05-30 MED ORDER — SODIUM CHLORIDE 0.9% FLUSH: 3.0000 mL | Freq: Once | INTRAVENOUS | Status: DC

## 2019-05-30 MED ORDER — ONDANSETRON 4 MG PO TBDP: 4.0000 mg | ORAL_TABLET | Freq: Three times a day (TID) | ORAL | 0 refills | Status: DC | PRN

## 2019-05-30 MED ORDER — ALUM & MAG HYDROXIDE-SIMETH 200-200-20 MG/5ML PO SUSP
30.0000 mL | Freq: Once | ORAL | Status: AC
  Administered 2019-05-30: 15:00:00 30 mL via ORAL
  Filled 2019-05-30: qty 30

## 2019-05-30 NOTE — ED Notes (Signed)
Pt given crackers and water per PO challenge order/request.

## 2019-05-30 NOTE — ED Notes (Signed)
Pt signed printed d/c paperwork.  

## 2019-05-30 NOTE — ED Notes (Addendum)
Pt reports had covid in January and has had scarring to lungs. Denies major SOB. Reports had sudden nausea, vomiting, & diarrhea this morning after taking several of her daily meds on an empty stomach. Denies nausea or pain currently.

## 2019-05-30 NOTE — ED Provider Notes (Signed)
[facility] Emergency Department Provider Note  ____________________________________________  Time seen: Approximately 3:42 PM  I have reviewed the triage vital signs and the nursing notes.   HISTORY  Chief Complaint Nausea and Emesis    HPI Maria Richardson is a 59 y.o. female with a history of COPD, recent COVID-19 infection a month ago is been taking albuterol, steroids, vitamin C and zinc for the past several days.  Today, after taking all her medications on an empty stomach she started to feel malaise and nausea.  She then had some upper abdominal discomfort and vomited one time.  She now is feeling better and denies any abdominal pain.  No chest pain shortness of breath fevers or chills.  No increasing cough, otherwise tolerating oral intake.  No radiating pain.    Past Medical History:  Diagnosis Date  . Asthma   . Eczema   . Hypoglycemia   . Hypothyroidism   . Shortness of breath dyspnea    because of asthma  . Wears dentures    full lower     Patient Active Problem List   Diagnosis Date Noted  . Acute respiratory failure with hypoxia ([place]) 05/20/2019  . Pulmonary emphysema ([place])   . Hypoxemia 05/19/2019  . COVID-19 virus infection 04/22/2019  . Acute respiratory failure with hypoxemia ([place]) 04/22/2019  . Asthma 04/22/2019  . Dyslipidemia 08/04/2015  . Benign neoplasm of sigmoid colon   . Seasonal allergic rhinitis 01/07/2015  . 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  . History of anemia 01/07/2015  . Excess weight 01/07/2015  . 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 Leiomyoma of uterus 01/07/2015  . Calculus of gallbladder 01/07/2015  . Hypothyroidism 09/24/2009  . COPD with asthma ([place]) 05/18/2009  . Vitamin D deficiency 05/18/2009     Past Surgical History:  Procedure Laterality Date  . APPENDECTOMY    . COLONOSCOPY WITH PROPOFOL N/A 07/23/2015   Procedure: COLONOSCOPY WITH PROPOFOL;  Surgeon: Lucilla Lame, MD;  Location: [place];   Service: Endoscopy;  Laterality: N/A;  PLEASE LEAVE PT AT [place]     as teenager  . POLYPECTOMY  07/23/2015   Procedure: POLYPECTOMY;  Surgeon: Lucilla Lame, MD;  Location: [place];  Service: Endoscopy;;     Prior to Admission medications   Medication Sig Start Date End Date Taking? Authorizing Provider  albuterol (VENTOLIN HFA) 108 (90 Base) MCG/ACT inhaler Inhale 2 puffs into the lungs every 6 (six) hours as needed for wheezing or shortness of breath. 05/21/19   Max Sane, MD  ascorbic acid (VITAMIN C) 500 MG tablet Take 1 tablet (500 mg total) by mouth daily. 04/25/19   Dhungel, Flonnie Overman, MD  calcipotriene (DOVONOX) 0.005 % cream APP AA BID 08/28/17   [provider]  famotidine (PEPCID) 20 MG tablet Take 1 tablet (20 mg total) by mouth 2 (two) times daily. 05/30/19   Carrie Mew, MD  Fluticasone-Umeclidin-Vilant (TRELEGY ELLIPTA) 100-62.5-25 MCG/INH AEPB Take 1 puff by mouth daily. 04/29/19   Steele Sizer, MD  guaiFENesin-dextromethorphan (ROBITUSSIN DM) 100-10 MG/5ML syrup Take 5 mLs by mouth every 4 (four) hours as needed for cough. 04/24/19   Dhungel, Flonnie Overman, MD  levothyroxine (SYNTHROID) 150 MCG tablet Take 150 mcg by mouth daily. 04/22/19   [provider]  ondansetron (ZOFRAN ODT) 4 MG disintegrating tablet Take 1 tablet (4 mg total) by mouth every 8 (eight) hours as needed for nausea or vomiting. 05/30/19   Carrie Mew, MD  predniSONE [facility] Eleanora Neighbor  21 TAB) 10 MG (21) TBPK tablet Start 60 mg p.o. daily.  Taper 10 mg daily until finished 05/21/19   Max Sane, MD  zinc sulfate 220 (50 Zn) MG capsule Take 1 capsule (220 mg total) by mouth daily. 04/25/19   Dhungel, Flonnie Overman, MD     Allergies Codeine   Family History  Problem Relation Age of Onset  . Diabetes Father   . Hypertension Father   . Kidney disease Father   . Colon cancer Father     Social History Social History   Tobacco Use  . Smoking status: Former  Smoker    Packs/day: 0.50    Years: 30.00    Pack years: 15.00    Types: Cigarettes    Quit date: 04/28/1999    Years since quitting: 20.1  . Smokeless tobacco: Never Used  Substance Use Topics  . Alcohol use: No    Alcohol/week: 0.0 standard drinks  . Drug use: No    Review of Systems  Constitutional:   No fever or chills.  ENT:   No sore throat. No rhinorrhea. Cardiovascular:   No chest pain or syncope. Respiratory:   No dyspnea or cough. Gastrointestinal:   Positive as above for abdominal pain and vomiting Musculoskeletal:   Negative for focal pain or swelling All other systems reviewed and are negative except as documented above in ROS and HPI.  ____________________________________________   PHYSICAL EXAM:  VITAL SIGNS: ED Triage Vitals  Enc Vitals Group     BP 05/30/19 1158 112/64     Pulse Rate 05/30/19 1158 95     Resp 05/30/19 1158 16     Temp 05/30/19 1158 98.1 F (36.7 C)     Temp Source 05/30/19 1158 Oral     SpO2 05/30/19 1158 95 %     Weight 05/30/19 1156 147 lb 14.9 oz (67.1 kg)     Height 05/30/19 1156 5' (1.524 m)     Head Circumference --      Peak Flow --      Pain Score 05/30/19 1156 0     Pain Loc --      Pain Edu? --      Excl. in [place]? --     Vital signs reviewed, nursing assessments reviewed.   Constitutional:   Alert and oriented. Non-toxic appearance. Eyes:   Conjunctivae are normal. EOMI. PERRL. ENT      Head:   Normocephalic and atraumatic.      Nose:   Wearing a mask.      Mouth/Throat:   Wearing a mask.      Neck:   No meningismus. Full ROM. Hematological/Lymphatic/Immunilogical:   No cervical lymphadenopathy. Cardiovascular:   RRR. Symmetric bilateral radial and DP pulses.  No murmurs. Cap refill less than 2 seconds. Respiratory:   Normal respiratory effort without tachypnea/retractions. Breath sounds are clear and equal bilaterally. No wheezes/rales/rhonchi. Gastrointestinal:   Soft and nontender. Non distended. There is no CVA  tenderness.  No rebound, rigidity, or guarding. Genitourinary:   deferred Musculoskeletal:   Normal range of motion in all extremities. No joint effusions.  No lower extremity tenderness.  No edema. Neurologic:   Normal speech and language.  Motor grossly intact. No acute focal neurologic deficits are appreciated.  Skin:    Skin is warm, dry and intact. No rash noted.  No petechiae, purpura, or bullae.  ____________________________________________    LABS (pertinent positives/negatives) (all labs ordered are listed, but only abnormal results are displayed) Labs Reviewed  COMPREHENSIVE METABOLIC PANEL - Abnormal; Notable for the following components:      Result Value   Glucose, Bld 169 (*)    All other components within normal limits  CBC - Abnormal; Notable for the following components:   WBC 17.4 (*)    RDW 16.6 (*)    All other components within normal limits  URINALYSIS, COMPLETE (UACMP) WITH MICROSCOPIC - Abnormal; Notable for the following components:   Color, Urine YELLOW (*)    APPearance HAZY (*)    All other components within normal limits  LIPASE, BLOOD   ____________________________________________   EKG    ____________________________________________    RADIOLOGY  No results found.  ____________________________________________   PROCEDURES Procedures  ____________________________________________    CLINICAL IMPRESSION / ASSESSMENT AND PLAN / ED COURSE  Medications ordered in the ED: Medications  sodium chloride flush (NS) 0.9 % injection 3 mL (has no administration in time range)  ondansetron (ZOFRAN-ODT) disintegrating tablet 8 mg (8 mg Oral Given 05/30/19 1451)  alum & mag hydroxide-simeth (MAALOX/MYLANTA) 200-200-20 MG/5ML suspension 30 mL (30 mLs Oral Given 05/30/19 1451)  famotidine (PEPCID) tablet 40 mg (40 mg Oral Given 05/30/19 1451)    Pertinent labs & imaging results that were available during my care of the patient were reviewed by  me and considered in my medical decision making (see chart for details).  Maria Richardson was evaluated in Emergency Department on 05/30/2019 for the symptoms described in the history of present illness. She was evaluated in the context of the global COVID-19 pandemic, which necessitated consideration that the patient might be at risk for infection with the SARS-CoV-2 virus that causes COVID-19. Institutional protocols and algorithms that pertain to the evaluation of patients at risk for COVID-19 are in a state of rapid change based on information released by regulatory bodies including the CDC and federal and state organizations. These policies and algorithms were followed during the patient's care in the ED.   Patient presents with an episode of abdominal pain and vomiting, most likely gastritis given the self-limited nature and recent use of steroids, vitamin C, albuterol together on an empty stomach.  Exam is benign and reassuring, vital signs are normal, labs are normal.  She is tolerating oral intake.  Stable for discharge home, would recommend antacid therapy for the duration of her steroid use.  Continue follow-up with her pulmonologist, has an appointment scheduled already.      ____________________________________________   FINAL CLINICAL IMPRESSION(S) / ED DIAGNOSES    Final diagnoses:  Acute gastritis without hemorrhage, unspecified gastritis type     ED Discharge Orders         Ordered    ondansetron (ZOFRAN ODT) 4 MG disintegrating tablet  Every 8 hours PRN     05/30/19 1541    famotidine (PEPCID) 20 MG tablet  2 times daily     05/30/19 1541          Portions of this note were generated with dragon dictation software. Dictation errors may occur despite best attempts at proofreading.   Carrie Mew, MD 05/30/19 [phone]

## 2019-05-30 NOTE — ED Notes (Signed)
Pt tolerating snack and drink.

## 2019-05-30 NOTE — ED Notes (Signed)
Pt alert and resting calmly in bed. Bed locked low. Rail up.

## 2019-05-30 NOTE — ED Triage Notes (Signed)
States used her albuterol inhaler this morning on an empty stomach, and then states had an episode of vomiting and diarrhea.   Patient is AAOx3.  Skin warm and dry. NAD

## 2019-06-02 ENCOUNTER — Other Ambulatory Visit: Payer: Self-pay | Admitting: Internal Medicine

## 2019-06-02 ENCOUNTER — Telehealth: Payer: Self-pay

## 2019-06-02 DIAGNOSIS — U071 COVID-19: Secondary | ICD-10-CM

## 2019-06-02 DIAGNOSIS — J432 Centrilobular emphysema: Secondary | ICD-10-CM

## 2019-06-02 NOTE — Telephone Encounter (Signed)
Copied from [place] [id number]. Topic: General - Other >> Jun 02, 2019  8:09 AM Leward Quan A wrote: Reason for CRM: Patient called to inform Dr Ancil Boozer that the Fluticasone-Umeclidin-Vilant [facility] ELLIPTA) 100-62.5-25 MCG/INH AEPB is not working for her Asthma since recovering from Covid. Per patient when she was in the hospital she was given INCRUSE ELLIPTA, and Breo Inhaler. Patient states that she is having a hard time with her breathing and need a call back today. Please call Ph#  [phone] or [phone]   She has sob with exertion O2 level was 92% @ rest and when she walks around 88% with exertion. She also has a cough when she does not take her medication. She thinks the Trelegy is not working because of the problems with her asthma.

## 2019-06-03 NOTE — Telephone Encounter (Signed)
Neither of the inhalers are working for her at this point. She already has an appointment on 06/13/2019 to see Pulmonologist.

## 2019-06-03 NOTE — Telephone Encounter (Signed)
She is over COVID it has been over 21 days. She does not need to go to a [place] clinic unless she has covid.

## 2019-06-18 ENCOUNTER — Other Ambulatory Visit: Payer: Self-pay | Admitting: Internal Medicine

## 2019-06-30 ENCOUNTER — Telehealth: Payer: Self-pay

## 2019-06-30 IMAGING — CR DG CHEST 2V
1 series · 2 of 2 positions shown · non-contrast
Comparison: April 20, 2009

CLINICAL DATA: Cough and congestion

EXAM:
CHEST - 2 VIEW

[Series 1: dg chest 2 view · 0.14mm/px · 2 of 2 slices shown]
[im 1/2]
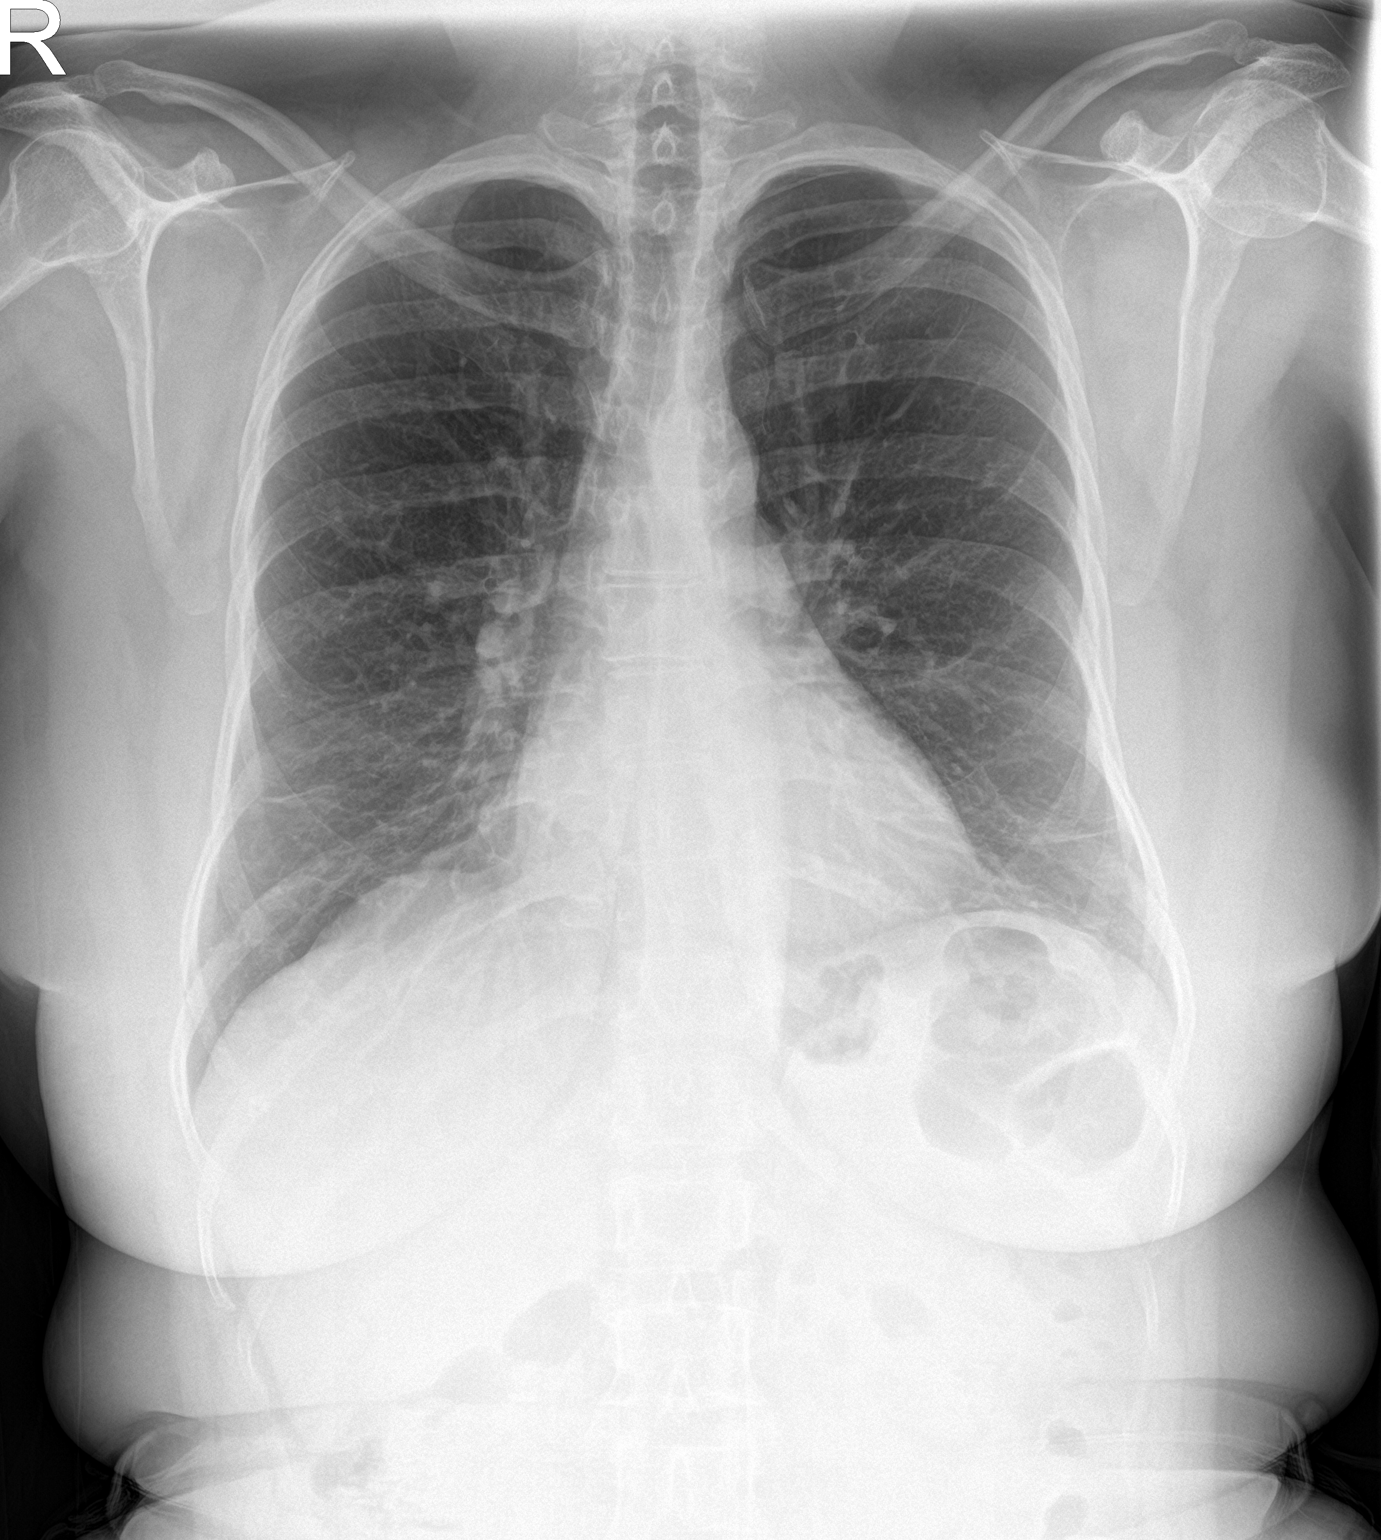
[im 2/2]
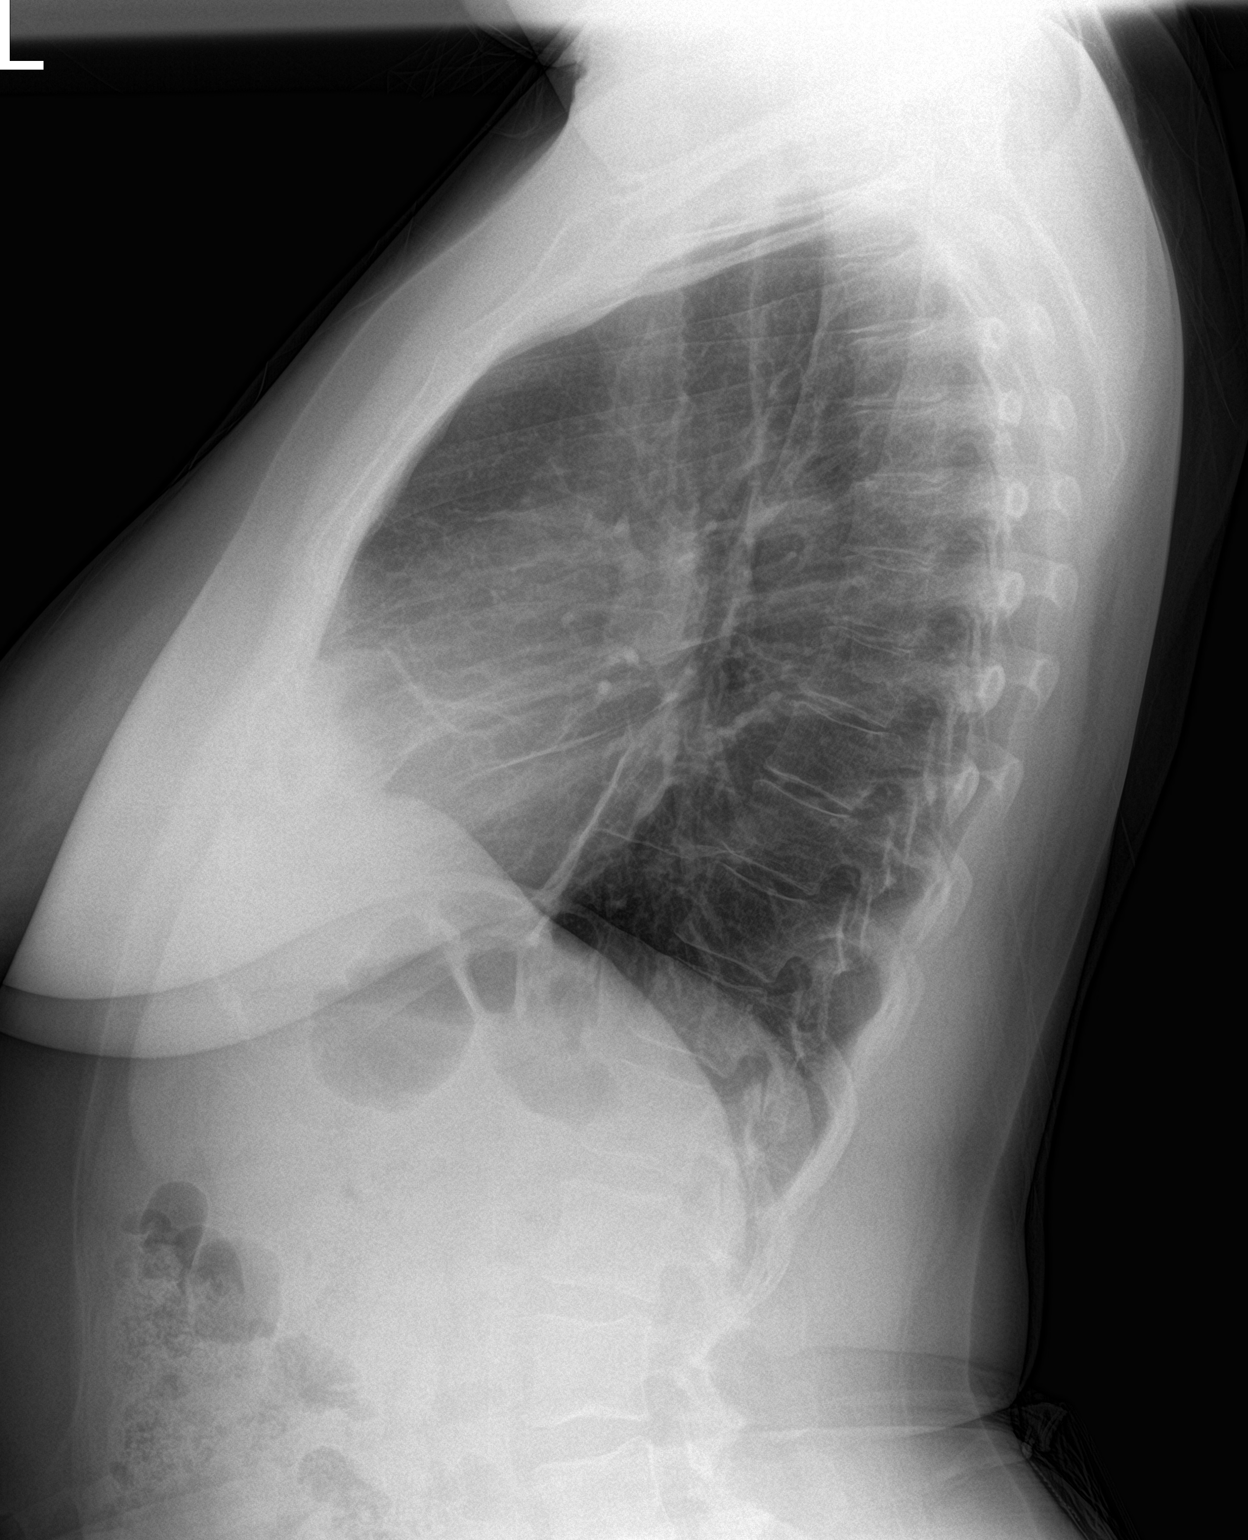

[2 of 2 positions shown; findings below may reference images not displayed]

FINDINGS: There is mild bibasilar atelectasis. There is no edema or
consolidation. Heart size and pulmonary vascularity are normal. No
adenopathy. No bone lesions.
IMPRESSION: Bibasilar atelectasis. No edema or consolidation. Heart size normal.

## 2019-06-30 NOTE — Telephone Encounter (Signed)
Copied from [place] [phone]. Topic: General - Other >> Jun 30, 2019  3:29 PM Alanda Slim E wrote: Reason for CRM: Pt went to pulmonary doctor today and they asked the pt when her last appt was that she had a breathing treatment done/ please advise   Last spirometry test I saw was 05/10/2016. Patient has been notified.

## 2019-07-17 ENCOUNTER — Other Ambulatory Visit: Payer: Self-pay | Admitting: *Deleted

## 2019-07-17 DIAGNOSIS — U071 COVID-19: Secondary | ICD-10-CM

## 2019-08-04 ENCOUNTER — Ambulatory Visit: Payer: Managed Care, Other (non HMO) | Admitting: Family Medicine

## 2019-09-25 ENCOUNTER — Telehealth: Payer: Self-pay | Admitting: Family Medicine

## 2019-09-25 NOTE — Telephone Encounter (Signed)
Patient requesting flexeril for muscles spasms. Recent visit at hospital yesterday. Please advise.

## 2019-09-25 NOTE — Telephone Encounter (Signed)
Patient was seen at the hospital on yesterday for muscle spasms, and was advised to take Tylenol. Patient states she is still having muscle spasms and wanted to know of PCP can prescribe Flexerall.

## 2019-10-16 ENCOUNTER — Other Ambulatory Visit: Payer: Self-pay | Admitting: Family Medicine

## 2019-10-16 DIAGNOSIS — E039 Hypothyroidism, unspecified: Secondary | ICD-10-CM

## 2019-11-11 DIAGNOSIS — M5412 Radiculopathy, cervical region: Secondary | ICD-10-CM | POA: Insufficient documentation

## 2019-12-10 NOTE — Progress Notes (Signed)
Name: Maria Richardson   MRN: [id number]    DOB: 03/05/1961   Date:12/12/2019       Progress Note  Subjective  Chief Complaint  Chief Complaint  Patient presents with  . Annual Exam  . Follow-up    HPI  Patient presents for annual CPE and follow up  Adult hypothyroidism: she resumed taking medication this week, she takes it off an on,  he has not been seen for a regular visit last year, explained importance of compliance, she has gained weight.   Pre-diabetes: father has DM, her hgbA1C went as high as 6.3%, but last one was 6% . She denies polyuria, polydipsia or polyphagia. She continues to drink sodas.   COPD/Asthma: she is on Trelegy once in am's. She has been seeing pulmonologist since she had COVID-19 back in January 2021, she denies wheezing or SOB, she has a cough when her husband is smoking . CT showed emphysema and also pulmonary nodules.   Vulva lesion: she noticed a spot on her vulva on right side going on for a while   Diet: discussed balanced diet with her today  Exercise: discussed 150 minutes per week.     Office Visit from 04/29/2019 in [facility]  AUDIT-C Score 0     Depression: Phq 9 is  negative Depression screen [facility] 2/9 12/12/2019 04/29/2019 10/17/2018 12/21/2017 04/27/2017  Decreased Interest 0 0 0 0 0  Down, Depressed, Hopeless 0 0 0 0 0  PHQ - 2 Score 0 0 0 0 0  Altered sleeping - 0 0 0 -  Tired, decreased energy - 0 0 0 -  Change in appetite - 0 0 0 -  Feeling bad or failure about yourself  - 0 0 0 -  Trouble concentrating - 0 0 0 -  Moving slowly or fidgety/restless - 0 0 0 -  Suicidal thoughts - 0 0 0 -  PHQ-9 Score - 0 0 0 -  Difficult doing work/chores - - Somewhat difficult Not difficult at all -   Hypertension: BP Readings from Last 3 Encounters:  12/12/19 130/80  05/30/19 115/82  05/21/19 128/69   Obesity: Wt Readings from Last 3 Encounters:  12/12/19 159 lb 6.4 oz (72.3 kg)  05/30/19 147 lb 14.9 oz (67.1 kg)   05/19/19 148 lb (67.1 kg)   BMI Readings from Last 3 Encounters:  12/12/19 31.13 kg/m  05/30/19 28.89 kg/m  05/19/19 28.90 kg/m     Vaccines:  Shingrix: 71-64 yo and ask insurance if covered when patient above 47 yo Pneumonia:  educated and discussed with patient. Flu:  educated and discussed with patient.  Hep C Screening: 2014  STD testing and prevention (HIV/chl/gon/syphilis): not interested  Intimate partner violence: negative screen  Sexual History : not sexually in years Menstrual History/LMP/Abnormal Bleeding:discussed post-menopausal bleeding  Incontinence Symptoms: no problems.   Breast cancer:  - Last Mammogram: last one in 2017 , explained importance of follow up - BRCA gene screening: N/A  Osteoporosis: Discussed high calcium and vitamin D supplementation, weight bearing exercises  Cervical cancer screening: today   Skin cancer: Discussed monitoring for atypical lesions  Colorectal cancer: repeat in 2022  Lung cancer:   She has lung nodules under the care of pulmonologist  ECG: 05/2019  Advanced Care Planning: A voluntary discussion about advance care planning including the explanation and discussion of advance directives.  Discussed health care proxy and Living will, and the patient was able to identify a health care proxy  as brother   Patient does not have a living will at present time. If patient does have living will  Lipids: Lab Results  Component Value Date   CHOL 199 02/21/2018   CHOL 176 04/27/2017   CHOL 164 08/04/2015   Lab Results  Component Value Date   HDL 50 02/21/2018   HDL 56 04/27/2017   HDL 58 08/04/2015   Lab Results  Component Value Date   LDLCALC 137 (H) 02/21/2018   LDLCALC 107 (H) 04/27/2017   LDLCALC 97 08/04/2015   Lab Results  Component Value Date   TRIG 66 04/21/2019   TRIG 60 02/21/2018   TRIG 64 04/27/2017   Lab Results  Component Value Date   CHOLHDL 4.0 02/21/2018   CHOLHDL 3.1 04/27/2017   CHOLHDL 2.8  08/04/2015   No results found for: LDLDIRECT  Glucose: Glucose, Bld  Date Value Ref Range Status  05/30/2019 169 (H) 70 - 99 mg/dL Final    Comment:    Glucose reference range applies only to samples taken after fasting for at least 8 hours.  05/20/2019 241 (H) 70 - 99 mg/dL Final  05/19/2019 118 (H) 70 - 99 mg/dL Final    Patient Active Problem List   Diagnosis Date Noted  . Cervical radiculitis 11/11/2019  . Pulmonary emphysema ([place])   . Asthma 04/22/2019  . History of COVID-19 04/2019  . Dyslipidemia 08/04/2015  . Benign neoplasm of sigmoid colon   . Seasonal allergic rhinitis 01/07/2015  . Lichen simplex 50/06/7046  . History of anemia 01/07/2015  . Excess weight 01/07/2015  . 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 Leiomyoma of uterus 01/07/2015  . Calculus of gallbladder 01/07/2015  . Hypothyroidism 09/24/2009  . 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 Vitamin D deficiency 05/18/2009    Past Surgical History:  Procedure Laterality Date  . APPENDECTOMY    . COLONOSCOPY WITH PROPOFOL N/A 07/23/2015   Procedure: COLONOSCOPY WITH PROPOFOL;  Surgeon: Lucilla Lame, MD;  Location: [place];  Service: Endoscopy;  Laterality: N/A;  PLEASE LEAVE PT AT [place]     as teenager  . POLYPECTOMY  07/23/2015   Procedure: POLYPECTOMY;  Surgeon: Lucilla Lame, MD;  Location: [place];  Service: Endoscopy;;    Family History  Problem Relation Age of Onset  . Diabetes Father   . Hypertension Father   . Kidney disease Father   . Colon cancer Father     Social History   Socioeconomic History  . Marital status: Married    Spouse name: Richard   . Number of children: 0  . Years of education: Not on file  . Highest education level: 12th grade  Occupational History  . Occupation: Hotel manager: [employer]  Tobacco Use  . Smoking status: Former Smoker    Packs/day: 0.50    Years: 30.00    Pack years: 15.00    Types: Cigarettes    Quit  date: 04/28/1999    Years since quitting: 20.6  . Smokeless tobacco: Never Used  Vaping Use  . Vaping Use: Never used  Substance and Sexual Activity  . Alcohol use: No    Alcohol/week: 0.0 standard drinks  . Drug use: No  . Sexual activity: Not Currently    Comment: no desire  Other Topics Concern  . Not on file  Social History Narrative   Husband has a history of alcohol use and sometimes drug use and she gets scared  of getting exposed to STI and sometimes she is scared when he is under the influence.    Never had children - but has step children and step grandchildren    Raised her cousin, still lives at home   Social Determinants of Health   Financial Resource Strain: Low Risk   . Difficulty of Paying Living Expenses: Not hard at all  Food Insecurity: No Food Insecurity  . Worried About Charity fundraiser in the Last Year: Never true  . Ran Out of Food in the Last Year: Never true  Transportation Needs: No Transportation Needs  . Lack of Transportation (Medical): No  . Lack of Transportation (Non-Medical): No  Physical Activity: Inactive  . Days of Exercise per Week: 0 days  . Minutes of Exercise per Session: 0 min  Stress: No Stress Concern Present  . Feeling of Stress : Not at all  Social Connections: Socially Integrated  . Frequency of Communication with Friends and Family: More than three times a week  . Frequency of Social Gatherings with Friends and Family: More than three times a week  . Attends Religious Services: 1 to 4 times per year  . Active Member of Clubs or Organizations: No  . Attends Archivist Meetings: 1 to 4 times per year  . Marital Status: Married  Human resources officer Violence: Not At Risk  . Fear of Current or Ex-Partner: No  . Emotionally Abused: No  . Physically Abused: No  . Sexually Abused: No     Current Outpatient Medications:  .  ascorbic acid (VITAMIN C) 500 MG tablet, Take 1 tablet (500 mg total) by mouth daily., Disp: 30  tablet, Rfl: 0 .  budesonide (PULMICORT) 0.5 MG/2ML nebulizer solution, Inhale into the lungs., Disp: , Rfl:  .  calcipotriene (DOVONOX) 0.005 % cream, APP AA BID, Disp: , Rfl: 1 .  Fluticasone-Umeclidin-Vilant (TRELEGY ELLIPTA) 100-62.5-25 MCG/INH AEPB, Take 1 puff by mouth daily., Disp: 180 each, Rfl: 1 .  levothyroxine (SYNTHROID) 150 MCG tablet, Take 150 mcg by mouth daily., Disp: , Rfl:  .  zinc sulfate 220 (50 Zn) MG capsule, Take 1 capsule (220 mg total) by mouth daily., Disp: 30 capsule, Rfl: 0  Allergies  Allergen Reactions  . Codeine Nausea And Vomiting     ROS  Constitutional: Negative for fever, positive for weight change.  Respiratory: Negative for cough and shortness of breath.   Cardiovascular: Negative for chest pain or palpitations.  Gastrointestinal: Negative for abdominal pain, no bowel changes.  Musculoskeletal: Negative for gait problem or joint swelling.  Skin: Negative for rash.  Neurological: Negative for dizziness or headache.  No other specific complaints in a complete review of systems (except as listed in HPI above).  Objective  Vitals:   12/12/19 0838  BP: 130/80  Pulse: 85  Resp: 16  Temp: 98 F (36.7 C)  TempSrc: Oral  SpO2: 99%  Weight: 159 lb 6.4 oz (72.3 kg)  Height: 5' (1.524 m)    Body mass index is 31.13 kg/m.  Physical Exam  Constitutional: Patient appears well-developed and well-nourished. No distress.  HENT: Head: Normocephalic and atraumatic. Ears: B TMs ok, no erythema or effusion; Nose: Not done. Mouth/Throat: Oropharynx not done Eyes: Conjunctivae and EOM are normal. Pupils are equal, round, and reactive to light. No scleral icterus.  Neck: Normal range of motion. Neck supple. No JVD present. No thyromegaly present.  Cardiovascular: Normal rate, regular rhythm and normal heart sounds.  No murmur heard. No BLE  edema. Pulmonary/Chest: Effort normal and breath sounds normal. No respiratory distress. Abdominal: Soft. Bowel  sounds are normal, no distension. There is no tenderness. no masses Breast: no lumps or masses, no nipple discharge or rashes FEMALE GENITALIA:  External genitalia hyperpigmented lesions possible SK on left side of vulva , skin tag on right labia majora  External urethra normal Vaginal vault normal without discharge or lesions Cervix normal without discharge or lesions Bimanual exam normal without masses RECTAL: not done Musculoskeletal: Normal range of motion, no joint effusions. No gross deformities Neurological: he is alert and oriented to person, place, and time. No cranial nerve deficit. Coordination, balance, strength, speech and gait are normal.  Skin: Skin is warm and dry. No rash noted. No erythema.  Psychiatric: Patient has a normal mood and affect. behavior is normal. Judgment and thought content normal.  Fall Risk: Fall Risk  12/12/2019 04/29/2019 10/17/2018 02/21/2018 12/21/2017  Falls in the past year? 0 0 0 0 No  Number falls in past yr: 0 0 0 0 -  Injury with Fall? 0 0 0 0 -    Functional Status Survey: Is the patient deaf or have difficulty hearing?: No Does the patient have difficulty seeing, even when wearing glasses/contacts?: No Does the patient have difficulty concentrating, remembering, or making decisions?: No Does the patient have difficulty walking or climbing stairs?: No Does the patient have difficulty dressing or bathing?: No Does the patient have difficulty doing errands alone such as visiting a doctor's office or shopping?: No   Assessment & Plan  1. COPD with asthma ([place])  - Fluticasone-Umeclidin-Vilant (TRELEGY ELLIPTA) 100-62.5-25 MCG/INH AEPB; Take 1 puff by mouth daily.  Dispense: 180 each; Refill: 1  2. History of COVID-19   3. Encounter for screening mammogram for malignant neoplasm of breast  Mammogram   4. Adult hypothyroidism  - TSH  5. Cervical cancer screening  - Cytology - PAP  6. Vitamin D deficiency  - VITAMIN D 25 Hydroxy  (Vit-D Deficiency, Fractures)  7. Dyslipidemia  - Lipid panel  8. Obesity (BMI 30.0-34.9)  Discussed with the patient the risk posed by an increased BMI. Discussed importance of portion control, calorie counting and at least 150 minutes of physical activity weekly. Avoid sweet beverages and drink more water. Eat at least 6 servings of fruit and vegetables daily   9. Well adult exam  - CBC with Differential/Platelet - COMPLETE METABOLIC PANEL WITH GFR  10. Diabetes mellitus screening  - Hemoglobin A1c  11. Pulmonary nodule  Seeing pulmonologist   12. Needs flu shot  Today  13. Lesion of vulva  - Ambulatory referral to Obstetrics / Gynecology  14. Skin tag of vulva  - Ambulatory referral to Obstetrics / Gynecology   -USPSTF grade A and B recommendations reviewed with patient; age-appropriate recommendations, preventive care, screening tests, etc discussed and encouraged; healthy living encouraged; see AVS for patient education given to patient -Discussed importance of 150 minutes of physical activity weekly, eat two servings of fish weekly, eat one serving of tree nuts ( cashews, pistachios, pecans, almonds.Marland Kitchen) every other day, eat 6 servings of fruit/vegetables daily and drink plenty of water and avoid sweet beverages.

## 2019-12-12 ENCOUNTER — Other Ambulatory Visit: Payer: Self-pay

## 2019-12-12 ENCOUNTER — Ambulatory Visit (INDEPENDENT_AMBULATORY_CARE_PROVIDER_SITE_OTHER): Payer: Managed Care, Other (non HMO) | Admitting: Family Medicine

## 2019-12-12 ENCOUNTER — Encounter: Payer: Self-pay | Admitting: Family Medicine

## 2019-12-12 ENCOUNTER — Other Ambulatory Visit (HOSPITAL_COMMUNITY)
Admission: RE | Admit: 2019-12-12 | Discharge: 2019-12-12 | Disposition: A | Discharge status: Home / Self Care | Payer: Managed Care, Other (non HMO) | Source: Ambulatory Visit | Attending: Family Medicine | Admitting: Family Medicine

## 2019-12-12 VITALS — BP 130/80 | HR 85 | Temp 98.0°F | Resp 16 | Ht 60.0 in | Wt 159.4 lb

## 2019-12-12 DIAGNOSIS — E669 Obesity, unspecified: Secondary | ICD-10-CM

## 2019-12-12 DIAGNOSIS — Z23 Encounter for immunization: Secondary | ICD-10-CM | POA: Diagnosis not present

## 2019-12-12 DIAGNOSIS — Z8616 Personal history of COVID-19: Secondary | ICD-10-CM

## 2019-12-12 DIAGNOSIS — Z124 Encounter for screening for malignant neoplasm of cervix: Secondary | ICD-10-CM | POA: Diagnosis present

## 2019-12-12 DIAGNOSIS — E039 Hypothyroidism, unspecified: Secondary | ICD-10-CM | POA: Diagnosis not present

## 2019-12-12 DIAGNOSIS — J449 Chronic obstructive pulmonary disease, unspecified: Secondary | ICD-10-CM

## 2019-12-12 DIAGNOSIS — E785 Hyperlipidemia, unspecified: Secondary | ICD-10-CM

## 2019-12-12 DIAGNOSIS — Z131 Encounter for screening for diabetes mellitus: Secondary | ICD-10-CM

## 2019-12-12 DIAGNOSIS — Z0000 Encounter for general adult medical examination without abnormal findings: Secondary | ICD-10-CM | POA: Diagnosis not present

## 2019-12-12 DIAGNOSIS — Z1231 Encounter for screening mammogram for malignant neoplasm of breast: Secondary | ICD-10-CM

## 2019-12-12 DIAGNOSIS — E66811 Obesity, class 1: Secondary | ICD-10-CM

## 2019-12-12 DIAGNOSIS — N9089 Other specified noninflammatory disorders of vulva and perineum: Secondary | ICD-10-CM

## 2019-12-12 DIAGNOSIS — E559 Vitamin D deficiency, unspecified: Secondary | ICD-10-CM

## 2019-12-12 DIAGNOSIS — J4489 Other specified chronic obstructive pulmonary disease: Secondary | ICD-10-CM

## 2019-12-12 DIAGNOSIS — R911 Solitary pulmonary nodule: Secondary | ICD-10-CM

## 2019-12-12 MED ORDER — TRELEGY ELLIPTA 100-62.5-25 MCG/INH IN AEPB: 1.0000 | INHALATION_SPRAY | Freq: Every day | RESPIRATORY_TRACT | 1 refills | Status: DC

## 2019-12-12 NOTE — Patient Instructions (Signed)

## 2019-12-13 LAB — CBC WITH DIFFERENTIAL/PLATELET
Basophils Absolute: 0.1 10*3/uL (ref 0.0–0.2)
Basos: 1 %
EOS (ABSOLUTE): 0.1 10*3/uL (ref 0.0–0.4)
Eos: 2 %
Hematocrit: 41.2 % (ref 34.0–46.6)
Hemoglobin: 13.4 g/dL (ref 11.1–15.9)
Immature Grans (Abs): 0.1 10*3/uL (ref 0.0–0.1)
Immature Granulocytes: 1 %
Lymphocytes Absolute: 2.1 10*3/uL (ref 0.7–3.1)
Lymphs: 30 %
MCH: 29.5 pg (ref 26.6–33.0)
MCHC: 32.5 g/dL (ref 31.5–35.7)
MCV: 91 fL (ref 79–97)
Monocytes Absolute: 0.5 10*3/uL (ref 0.1–0.9)
Monocytes: 7 %
Neutrophils Absolute: 4.1 10*3/uL (ref 1.4–7.0)
Neutrophils: 59 %
Platelets: 267 10*3/uL (ref 150–450)
RBC: 4.54 x10E6/uL (ref 3.77–5.28)
RDW: 15.9 % — ABNORMAL HIGH (ref 11.7–15.4)
WBC: 6.8 10*3/uL (ref 3.4–10.8)

## 2019-12-13 LAB — COMPREHENSIVE METABOLIC PANEL
ALT: 39 IU/L — ABNORMAL HIGH (ref 0–32)
AST: 21 IU/L (ref 0–40)
Albumin/Globulin Ratio: 1.5 (ref 1.2–2.2)
Albumin: 4.2 g/dL (ref 3.8–4.9)
Alkaline Phosphatase: 145 IU/L — ABNORMAL HIGH (ref 48–121)
BUN/Creatinine Ratio: 18 (ref 9–23)
BUN: 18 mg/dL (ref 6–24)
Bilirubin Total: 0.2 mg/dL (ref 0.0–1.2)
CO2: 24 mmol/L (ref 20–29)
Calcium: 9.6 mg/dL (ref 8.7–10.2)
Chloride: 103 mmol/L (ref 96–106)
Creatinine, Ser: 1.01 mg/dL — ABNORMAL HIGH (ref 0.57–1.00)
GFR calc Af Amer: 71 mL/min/{1.73_m2} (ref >59)
GFR calc non Af Amer: 62 mL/min/{1.73_m2} (ref >59)
Globulin, Total: 2.8 g/dL (ref 1.5–4.5)
Glucose: 84 mg/dL (ref 65–99)
Potassium: 4.5 mmol/L (ref 3.5–5.2)
Sodium: 141 mmol/L (ref 134–144)
Total Protein: 7 g/dL (ref 6.0–8.5)

## 2019-12-13 LAB — VITAMIN D 25 HYDROXY (VIT D DEFICIENCY, FRACTURES): Vit D, 25-Hydroxy: 15.9 ng/mL — ABNORMAL LOW (ref 30.0–100.0)

## 2019-12-13 LAB — LIPID PANEL
Chol/HDL Ratio: 4.6 ratio — ABNORMAL HIGH (ref 0.0–4.4)
Cholesterol, Total: 292 mg/dL — ABNORMAL HIGH (ref 100–199)
HDL: 64 mg/dL (ref >39)
LDL Chol Calc (NIH): 211 mg/dL — ABNORMAL HIGH (ref 0–99)
Triglycerides: 99 mg/dL (ref 0–149)
VLDL Cholesterol Cal: 17 mg/dL (ref 5–40)

## 2019-12-13 LAB — HEMOGLOBIN A1C
Est. average glucose Bld gHb Est-mCnc: 131 mg/dL
Hgb A1c MFr Bld: 6.2 % — ABNORMAL HIGH (ref 4.8–5.6)

## 2019-12-13 LAB — TSH: TSH: 25.9 u[IU]/mL — ABNORMAL HIGH (ref 0.450–4.500)

## 2019-12-14 ENCOUNTER — Other Ambulatory Visit: Payer: Self-pay | Admitting: Family Medicine

## 2019-12-14 DIAGNOSIS — E7800 Pure hypercholesterolemia, unspecified: Secondary | ICD-10-CM

## 2019-12-14 DIAGNOSIS — E039 Hypothyroidism, unspecified: Secondary | ICD-10-CM

## 2019-12-14 DIAGNOSIS — E559 Vitamin D deficiency, unspecified: Secondary | ICD-10-CM

## 2019-12-14 MED ORDER — VITAMIN D (ERGOCALCIFEROL) 1.25 MG (50000 UNIT) PO CAPS: 50000.0000 [IU] | ORAL_CAPSULE | ORAL | 1 refills | Status: DC

## 2019-12-16 LAB — CYTOLOGY - PAP
Comment: NEGATIVE
Diagnosis: UNDETERMINED — AB
High risk HPV: NEGATIVE

## 2019-12-31 ENCOUNTER — Ambulatory Visit (INDEPENDENT_AMBULATORY_CARE_PROVIDER_SITE_OTHER): Payer: Managed Care, Other (non HMO) | Admitting: Obstetrics and Gynecology

## 2019-12-31 ENCOUNTER — Other Ambulatory Visit: Payer: Self-pay | Admitting: Obstetrics and Gynecology

## 2019-12-31 ENCOUNTER — Other Ambulatory Visit: Payer: Self-pay

## 2019-12-31 ENCOUNTER — Encounter: Payer: Self-pay | Admitting: Obstetrics and Gynecology

## 2019-12-31 VITALS — BP 122/74 | HR 72 | Resp 18 | Ht 60.0 in | Wt 161.2 lb

## 2019-12-31 DIAGNOSIS — N9089 Other specified noninflammatory disorders of vulva and perineum: Secondary | ICD-10-CM | POA: Diagnosis not present

## 2019-12-31 DIAGNOSIS — L818 Other specified disorders of pigmentation: Secondary | ICD-10-CM | POA: Diagnosis not present

## 2019-12-31 DIAGNOSIS — A630 Anogenital (venereal) warts: Secondary | ICD-10-CM | POA: Diagnosis not present

## 2019-12-31 NOTE — Progress Notes (Signed)
Pt states she has a skin tags and hyperpigmented lesions on her vulva. Pt is referred by [facility].

## 2019-12-31 NOTE — Progress Notes (Signed)
Patient ID: Maria Richardson, female   DOB: [birth year], 59 y.o.   MRN: [id number]  Reason for Consult: Gynecologic Exam   Referred by Steele Sizer, MD  Subjective:     HPI:  Maria Richardson is a 59 y.o. female.  She observed changes to her vulva about 3 weeks ago.  She reports that she felt off and looked at her bottom with an year.  She noticed some darkened spots and a skin tag and was seen by her primary care physician who referred her here today.  She denies any itching or pain of the vulva.  She is not currently sexually active.   Gynecological History Menopause: 51 Last pap smear: 12/12/2019- ASCUS HPV negative, repeat in 3 years Last Mammogram: 2017- BIRADS 1 History of STDs: reports a history of TOA and removal of 1 ovary for this as a teen Sexually Active: no, last intercourse 7 years ago   Past Medical History:  Diagnosis Date  . Asthma   . Eczema   . Hypoglycemia   . Hypothyroidism   . Shortness of breath dyspnea    because of asthma  . Wears dentures    full lower   Family History  Problem Relation Age of Onset  . Diabetes Father   . Hypertension Father   . Kidney disease Father   . Colon cancer Father    Past Surgical History:  Procedure Laterality Date  . APPENDECTOMY    . COLONOSCOPY WITH PROPOFOL N/A 07/23/2015   Procedure: COLONOSCOPY WITH PROPOFOL;  Surgeon: Lucilla Lame, MD;  Location: [place];  Service: Endoscopy;  Laterality: N/A;  PLEASE LEAVE PT AT [place]     as teenager  . POLYPECTOMY  07/23/2015   Procedure: POLYPECTOMY;  Surgeon: Lucilla Lame, MD;  Location: [place];  Service: Endoscopy;;    Short Social History:  Social History   Tobacco Use  . Smoking status: Former Smoker    Packs/day: 0.50    Years: 30.00    Pack years: 15.00    Types: Cigarettes    Quit date: 04/28/1999    Years since quitting: 20.6  . Smokeless tobacco: Never Used  Substance Use Topics  . Alcohol use: No     Alcohol/week: 0.0 standard drinks    Allergies  Allergen Reactions  . Codeine Nausea And Vomiting    Current Outpatient Medications  Medication Sig Dispense Refill  . ascorbic acid (VITAMIN C) 500 MG tablet Take 1 tablet (500 mg total) by mouth daily. 30 tablet 0  . budesonide (PULMICORT) 0.5 MG/2ML nebulizer solution Inhale into the lungs.    . calcipotriene (DOVONOX) 0.005 % cream APP AA BID  1  . Fluticasone-Umeclidin-Vilant (TRELEGY ELLIPTA) 100-62.5-25 MCG/INH AEPB Take 1 puff by mouth daily. 180 each 1  . levothyroxine (SYNTHROID) 150 MCG tablet Take 150 mcg by mouth daily.    . Vitamin D, Ergocalciferol, (DRISDOL) 1.25 MG (50000 UNIT) CAPS capsule Take 1 capsule (50,000 Units total) by mouth every 7 (seven) days. 12 capsule 1  . zinc sulfate 220 (50 Zn) MG capsule Take 1 capsule (220 mg total) by mouth daily. (Patient not taking: Reported on 12/31/2019) 30 capsule 0   No current facility-administered medications for this visit.    Review of Systems  Constitutional: Negative for chills, fatigue, fever and unexpected weight change.  HENT: Negative for trouble swallowing.  Eyes: Negative for loss of vision.  Respiratory: Negative for cough, shortness of breath and  wheezing.  Cardiovascular: Negative for chest pain, leg swelling, palpitations and syncope.  GI: Negative for abdominal pain, blood in stool, diarrhea, nausea and vomiting.  GU: Negative for difficulty urinating, dysuria, frequency and hematuria.  Musculoskeletal: Negative for back pain, leg pain and joint pain.  Skin: Negative for rash.  Neurological: Negative for dizziness, headaches, light-headedness, numbness and seizures.  Psychiatric: Negative for behavioral problem, confusion, depressed mood and sleep disturbance.        Objective:  Objective   Vitals:   12/31/19 0936  BP: 122/74  Pulse: 72  Resp: 18  SpO2: 97%  Weight: 161 lb 3.2 oz (73.1 kg)  Height: 5' (1.524 m)   Body mass index is 31.48  kg/m.  Physical Exam Vitals and nursing note reviewed. Exam conducted with a chaperone present.  Constitutional:      Appearance: She is well-developed.  HENT:     Head: Normocephalic and atraumatic.  Eyes:     Pupils: Pupils are equal, round, and reactive to light.  Cardiovascular:     Rate and Rhythm: Normal rate and regular rhythm.  Pulmonary:     Effort: Pulmonary effort is normal. No respiratory distress.  Genitourinary:   Skin:    General: Skin is warm and dry.  Neurological:     Mental Status: She is alert and oriented to person, place, and time.  Psychiatric:        Behavior: Behavior normal.        Thought Content: Thought content normal.        Judgment: Judgment normal.         Assessment/Plan:     59 yo with vulvar skin changes 1. Skin tag- not bothersome, does not need to be removed.  2. Small genital warts, could consider cryotherapy for removal.  3. Flat hyperpigmented macules. Will monitor, seems to be atrophy related changes, possible lichen sclerosis. Patient denies itching or pain. Follow up in 3 months to monitor. Encouraged patient to observe with a mirror every 2 weeks and contact us with any changes or growth. Assuming no changes in 3 months would recommend yearly monitoring after that. Will plan biopsy if there are any changes.  4.  Encouraged the patient to follow-up for her mammogram and schedule.  Patient was given phone number for [facility] imaging center.   More than 20 minutes were spent face to face with the patient in the room, reviewing the medical record, labs and images, and coordinating care for the patient. The plan of management was discussed in detail and counseling was provided.    Adrian Prows MD [facility], [facility] 12/31/2019 10:07 AM

## 2020-01-15 ENCOUNTER — Other Ambulatory Visit: Payer: Self-pay

## 2020-01-15 ENCOUNTER — Encounter: Payer: Managed Care, Other (non HMO) | Attending: Pulmonary Disease

## 2020-01-15 DIAGNOSIS — U071 COVID-19: Secondary | ICD-10-CM

## 2020-01-15 NOTE — Progress Notes (Signed)
Virtual Visit completed. Patient informed on EP and RD appointment and 6 Minute walk test. Patient also informed of patient health questionnaires on My Chart. Patient Verbalizes understanding. Visit diagnosis can be found in [facility] 05/19/2019.

## 2020-02-12 ENCOUNTER — Encounter: Payer: Managed Care, Other (non HMO) | Attending: Pulmonary Disease

## 2020-04-14 ENCOUNTER — Ambulatory Visit: Payer: Managed Care, Other (non HMO) | Admitting: Obstetrics and Gynecology

## 2020-06-08 NOTE — Progress Notes (Deleted)
Name: Maria Richardson   MRN: [id number]    DOB: [birth year]   Date:06/08/2020       Progress Note  Subjective  Chief Complaint  Follow up   HPI Adult hypothyroidism: she resumed taking medication this week, she takes it off an on,  he has not been seen for a regular visit last year, explained importance of compliance, she has gained weight.   Pre-diabetes: father has DM, her hgbA1C went as high as 6.3%, but last one was 6% . She denies polyuria, polydipsia or polyphagia. She continues to drink sodas.   COPD/Asthma: she is on Trelegy once in am's. She has been seeing pulmonologist since she had COVID-19 back in January 2021, she denies wheezing or SOB, she has a cough when her husband is smoking . CT showed emphysema and also pulmonary nodules.   Vulva lesion: she noticed a spot on her vulva on right side going on for a while  *** Patient Active Problem List   Diagnosis Date Noted  . Cervical radiculitis 11/11/2019  . Pulmonary emphysema ([place])   . Asthma 04/22/2019  . History of COVID-19 04/2019  . Dyslipidemia 08/04/2015  . Benign neoplasm of sigmoid colon   . Seasonal allergic rhinitis 01/07/2015  . Lichen simplex 67/03/4579  . History of anemia 01/07/2015  . Excess weight 01/07/2015  . 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 Leiomyoma of uterus 01/07/2015  . Calculus of gallbladder 01/07/2015  . Hypothyroidism 09/24/2009  . COPD with asthma ([place]) 05/18/2009  . Vitamin D deficiency 05/18/2009    Past Surgical History:  Procedure Laterality Date  . APPENDECTOMY    . COLONOSCOPY WITH PROPOFOL N/A 07/23/2015   Procedure: COLONOSCOPY WITH PROPOFOL;  Surgeon: Lucilla Lame, MD;  Location: [place];  Service: Endoscopy;  Laterality: N/A;  PLEASE LEAVE PT AT [place]     as teenager  . POLYPECTOMY  07/23/2015   Procedure: POLYPECTOMY;  Surgeon: Lucilla Lame, MD;  Location: [place];  Service: Endoscopy;;    Family History  Problem Relation Age  of Onset  . Diabetes Father   . Hypertension Father   . Kidney disease Father   . Colon cancer Father     Social History   Tobacco Use  . Smoking status: Former Smoker    Packs/day: 0.50    Years: 30.00    Pack years: 15.00    Types: Cigarettes    Quit date: 04/27/1998    Years since quitting: 22.1  . Smokeless tobacco: Never Used  Substance Use Topics  . Alcohol use: No    Alcohol/week: 0.0 standard drinks     Current Outpatient Medications:  .  ascorbic acid (VITAMIN C) 500 MG tablet, Take 1 tablet (500 mg total) by mouth daily., Disp: 30 tablet, Rfl: 0 .  budesonide (PULMICORT) 0.5 MG/2ML nebulizer solution, Inhale into the lungs., Disp: , Rfl:  .  calcipotriene (DOVONOX) 0.005 % cream, APP AA BID, Disp: , Rfl: 1 .  Fluticasone-Umeclidin-Vilant (TRELEGY ELLIPTA) 100-62.5-25 MCG/INH AEPB, Take 1 puff by mouth daily., Disp: 180 each, Rfl: 1 .  levothyroxine (SYNTHROID) 150 MCG tablet, Take 150 mcg by mouth daily., Disp: , Rfl:  .  Vitamin D, Ergocalciferol, (DRISDOL) 1.25 MG (50000 UNIT) CAPS capsule, Take 1 capsule (50,000 Units total) by mouth every 7 (seven) days., Disp: 12 capsule, Rfl: 1 .  zinc sulfate 220 (50 Zn) MG capsule, Take 1 capsule (220 mg total) by mouth daily., Disp: 30 capsule, Rfl: 0  Allergies  Allergen Reactions  . Codeine Nausea And Vomiting    I personally reviewed {Reviewed:14835} with the patient/caregiver today.   ROS  ***  Objective  There were no vitals filed for this visit.  There is no height or weight on file to calculate BMI.  Physical Exam ***  No results found for this or any previous visit (from the past 2160 hour(s)).  Diabetic Foot Exam: Diabetic Foot Exam - Simple   No data filed    ***  PHQ2/9: Depression screen [facility] 2/9 12/12/2019 04/29/2019 10/17/2018 12/21/2017 04/27/2017  Decreased Interest 0 0 0 0 0  Down, Depressed, Hopeless 0 0 0 0 0  PHQ - 2 Score 0 0 0 0 0  Altered sleeping - 0 0 0 -  Tired, decreased  energy - 0 0 0 -  Change in appetite - 0 0 0 -  Feeling bad or failure about yourself  - 0 0 0 -  Trouble concentrating - 0 0 0 -  Moving slowly or fidgety/restless - 0 0 0 -  Suicidal thoughts - 0 0 0 -  PHQ-9 Score - 0 0 0 -  Difficult doing work/chores - - Somewhat difficult Not difficult at all -    phq 9 is {gen pos [id number]} ***  Fall Risk: Fall Risk  01/15/2020 12/12/2019 04/29/2019 10/17/2018 02/21/2018  Falls in the past year? 0 0 0 0 0  Number falls in past yr: 0 0 0 0 0  Injury with Fall? 0 0 0 0 0  Risk for fall due to : No Fall Risks - - - -  Follow up Falls evaluation completed;Falls prevention discussed;Education provided - - - -   ***   Functional Status Survey:   ***   Assessment & Plan  *** There are no diagnoses linked to this encounter.

## 2020-06-09 ENCOUNTER — Telehealth: Payer: Self-pay

## 2020-06-09 NOTE — Telephone Encounter (Signed)
Pt had to reschedule her appt from today due to a mandatory meeting at work. She rescheduled for 3.30.2022. she would like to know if you could refill levothyroxine 173mcg and trelegy 100-62.5-25mcg. Please send to [facility]

## 2020-06-10 ENCOUNTER — Ambulatory Visit: Payer: Managed Care, Other (non HMO) | Admitting: Family Medicine

## 2020-06-10 DIAGNOSIS — Z1231 Encounter for screening mammogram for malignant neoplasm of breast: Secondary | ICD-10-CM

## 2020-06-14 ENCOUNTER — Other Ambulatory Visit: Payer: Self-pay | Admitting: Family Medicine

## 2020-06-14 DIAGNOSIS — E559 Vitamin D deficiency, unspecified: Secondary | ICD-10-CM

## 2020-06-14 NOTE — Telephone Encounter (Signed)
Requested medication (s) are due for refill today: yes  Requested medication (s) are on the active medication list: yes  Last refill: 03/13/2020  Future visit scheduled:  yes  Notes to clinic:  this refill cannot be delegated    Requested Prescriptions  Pending Prescriptions Disp Refills   Vitamin D, Ergocalciferol, (DRISDOL) 1.25 MG (50000 UNIT) CAPS capsule [Pharmacy Med Name: VITAMIN D2 50,000IU (ERGO) CAP RX] 12 capsule 1    Sig: TAKE 1 CAPSULE BY MOUTH EVERY 7 DAYS      Endocrinology:  Vitamins - Vitamin D Supplementation Failed - 06/14/2020 10:24 AM      Failed - 50,000 IU strengths are not delegated      Failed - Phosphate in normal range and within 360 days    No results found for: PHOS        Failed - Vitamin D in normal range and within 360 days    Vit D, 25-Hydroxy  Date Value Ref Range Status  12/12/2019 15.9 (L) 30.0 - 100.0 ng/mL Final    Comment:    Vitamin D deficiency has been defined by the Institute of Medicine and an Endocrine Society practice guideline as a level of serum 25-OH vitamin D less than 20 ng/mL (1,2). The Endocrine Society went on to further define vitamin D insufficiency as a level between 21 and 29 ng/mL (2). 1. IOM (Institute of Medicine). 2010. Dietary reference    intakes for calcium and D. [place]: The    [employer]. 2. Holick MF, Binkley [place], Bischoff-Ferrari HA, et al.    Evaluation, treatment, and prevention of vitamin D    deficiency: an Endocrine Society clinical practice    guideline. JCEM. 2011 Jul; 96(7):1911-30.           Passed - Ca in normal range and within 360 days    Calcium  Date Value Ref Range Status  12/12/2019 9.6 8.7 - 10.2 mg/dL Final          Passed - Valid encounter within last 12 months    Recent Outpatient Visits           6 months ago COPD with asthma [facility])   [facility] Steele Sizer, MD   1 year ago History of COVID-19   [facility]  Steele Sizer, MD   1 year ago Bilateral hand pain   [facility] Steele Sizer, MD   2 years ago Vitamin D deficiency   [facility] Steele Sizer, MD   2 years ago COPD with asthma [facility])   [facility] Steele Sizer, MD       Future Appointments             In 2 weeks Steele Sizer, MD [facility] [place]   In 6 months Steele Sizer, MD [facility]

## 2020-06-29 NOTE — Progress Notes (Signed)
Name: Maria Richardson   MRN: [id number]    DOB: 05-12-1960   Date:06/30/2020       Progress Note  Subjective  Chief Complaint  Follow up   HPI    Adult hypothyroidism: she has been taking levothyroxine daily but the bottle she had at home was 137 mcg daily for the past month. We will recheck level before sending another refill. She denies hair loss, no change in bowel movements, denies constipation   Pre-diabetes: father had a history of  DM, her hgbA1C was up to 6.2 % last visit, she states stop drinking sodas and eating sweets because it was making her feel dizzy. She denies polyuria, polydipsia or polyphagia.   COPD/Asthma: she is on Trelegy once in am's. She has been seeing pulmonologist since she had COVID-19 back in January 2021, she denies wheezing or SOB, she has a cough when her husband is smoking . CT showed emphysema, ground glass opacifications  and also pulmonary nodules. Explained importance of going back to pulmonologist.   Vulva lesion: she saw Dr. 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 Cervical radiculitis 11/11/2019  . Pulmonary emphysema ([place])   . Asthma 04/22/2019  . History of COVID-19 04/2019  . Dyslipidemia 08/04/2015  . Benign neoplasm of sigmoid colon   . Seasonal allergic rhinitis 01/07/2015  . Lichen simplex 00/86/7619  . History of anemia 01/07/2015  . Excess weight 01/07/2015  . 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 Leiomyoma of uterus 01/07/2015  . Calculus of gallbladder 01/07/2015  . Hypothyroidism 09/24/2009  . COPD with asthma ([place]) 05/18/2009  . Vitamin D deficiency 05/18/2009    Past Surgical History:   Procedure Laterality Date  . APPENDECTOMY    . COLONOSCOPY WITH PROPOFOL N/A 07/23/2015   Procedure: COLONOSCOPY WITH PROPOFOL;  Surgeon: Lucilla Lame, MD;  Location: [place];  Service: Endoscopy;  Laterality: N/A;  PLEASE LEAVE PT AT [place]     as teenager  . POLYPECTOMY  07/23/2015   Procedure: POLYPECTOMY;  Surgeon: Lucilla Lame, MD;  Location: [place];  Service: Endoscopy;;    Family History  Problem Relation Age of Onset  . Diabetes Father   . Hypertension Father   . Kidney disease Father   . Colon cancer Father     Social History   Tobacco Use  . Smoking status: Former Smoker    Packs/day: 0.50    Years: 30.00    Pack years: 15.00    Types: Cigarettes    Quit date: 04/27/1998    Years since quitting: 22.1  . Smokeless tobacco: Never Used  Substance Use Topics  . Alcohol use: No    Alcohol/week: 0.0 standard drinks     Current Outpatient Medications:  .  ascorbic acid (VITAMIN C) 500 MG tablet, Take 1 tablet (500 mg total) by mouth daily., Disp: 30 tablet, Rfl: 0 .  levothyroxine (SYNTHROID) 150 MCG tablet, Take 150 mcg by mouth daily., Disp: , Rfl:  .  triamcinolone ointment (KENALOG) 0.5 %, Apply 1 application topically 2 (two) times daily., Disp: 60 g, Rfl: 0 .  Vitamin D,  Ergocalciferol, (DRISDOL) 1.25 MG (50000 UNIT) CAPS capsule, TAKE 1 CAPSULE BY MOUTH EVERY 7 DAYS, Disp: 12 capsule, Rfl: 1 .  Fluticasone-Umeclidin-Vilant (TRELEGY ELLIPTA) 100-62.5-25 MCG/INH AEPB, Take 1 puff by mouth daily., Disp: 180 each, Rfl: 1  Allergies  Allergen Reactions  . Codeine Nausea And Vomiting    I personally reviewed active problem list, medication list, allergies, family history, social history with the patient/caregiver today.   ROS  Ten systems reviewed and is negative except as mentioned in HPI   Objective  Vitals:   06/30/20 1548  BP: 132/82  Pulse: 86  Resp: 16  Temp: 98 F (36.7 C)  TempSrc: Oral  SpO2: 96%   Weight: 155 lb (70.3 kg)  Height: 5' (1.524 m)    Body mass index is 30.27 kg/m.  Physical Exam  Constitutional: Patient appears well-developed and well-nourished. Obese  No distress.  HEENT: head atraumatic, normocephalic, pupils equal and reactive to light,  neck supple Cardiovascular: Normal rate, regular rhythm and normal heart sounds.  No murmur heard. No BLE edema. Skin: neck showed eczematous patch on left side /upper shoulder  Pulmonary/Chest: Effort normal and breath sounds normal. No respiratory distress. Abdominal: Soft.  There is no tenderness. Psychiatric: Patient has a normal mood and affect. behavior is normal. Judgment and thought content normal.  PHQ2/9: Depression screen [facility] 2/9 06/30/2020 12/12/2019 04/29/2019 10/17/2018 12/21/2017  Decreased Interest 0 0 0 0 0  Down, Depressed, Hopeless 0 0 0 0 0  PHQ - 2 Score 0 0 0 0 0  Altered sleeping - - 0 0 0  Tired, decreased energy - - 0 0 0  Change in appetite - - 0 0 0  Feeling bad or failure about yourself  - - 0 0 0  Trouble concentrating - - 0 0 0  Moving slowly or fidgety/restless - - 0 0 0  Suicidal thoughts - - 0 0 0  PHQ-9 Score - - 0 0 0  Difficult doing work/chores - - - Somewhat difficult Not difficult at all    phq 9 is negative  Fall Risk: Fall Risk  06/30/2020 01/15/2020 12/12/2019 04/29/2019 10/17/2018  Falls in the past year? 0 0 0 0 0  Number falls in past yr: 0 0 0 0 0  Injury with Fall? 0 0 0 0 0  Risk for fall due to : - No Fall Risks - - -  Follow up - Falls evaluation completed;Falls prevention discussed;Education provided - - -     Functional Status Survey: Is the patient deaf or have difficulty hearing?: No Does the patient have difficulty seeing, even when wearing glasses/contacts?: No Does the patient have difficulty concentrating, remembering, or making decisions?: No Does the patient have difficulty walking or climbing stairs?: No Does the patient have difficulty dressing or bathing?:  No Does the patient have difficulty doing errands alone such as visiting a doctor's office or shopping?: No    Assessment & Plan  1. Ground glass opacity present on imaging of lung  - Ambulatory referral to Pulmonology  2. COPD with asthma ([place])  - Fluticasone-Umeclidin-Vilant (TRELEGY ELLIPTA) 100-62.5-25 MCG/INH AEPB; Take 1 puff by mouth daily.  Dispense: 180 each; Refill: 1  3. Breast cancer screening by mammogram  - MM 3D SCREEN BREAST BILATERAL; Future  4. Adult hypothyroidism  - TSH - TSH - Comprehensive Metabolic Panel (CMET)  5. Vitamin D deficiency  - Vitamin D (25 hydroxy) - Comprehensive Metabolic Panel (CMET)  6. Hypercholesteremia  - Lipid Profile -  Comprehensive Metabolic Panel (CMET)  7. Hyperglycemia  - HgB A1c - Comprehensive Metabolic Panel (CMET)  8. Obesity (BMI 30.0-34.9)  - Comprehensive Metabolic Panel (CMET)  9. Long-term use of high-risk medication  - Comprehensive Metabolic Panel (CMET)  10. Pre-diabetes  - Comprehensive Metabolic Panel (CMET)  11. Multiple pulmonary nodules  - Ambulatory referral to Pulmonology  12. Pulmonary emphysema, unspecified emphysema type ([place])  - Ambulatory referral to Pulmonology  13. Colon cancer screening  - Ambulatory referral to Gastroenterology  14. Contact dermatitis and eczema  - triamcinolone ointment (KENALOG) 0.5 %; Apply 1 application topically 2 (two) times daily.  Dispense: 60 g; Refill: 0

## 2020-06-30 ENCOUNTER — Other Ambulatory Visit: Payer: Self-pay

## 2020-06-30 ENCOUNTER — Ambulatory Visit: Payer: Managed Care, Other (non HMO) | Admitting: Family Medicine

## 2020-06-30 ENCOUNTER — Encounter: Payer: Self-pay | Admitting: Family Medicine

## 2020-06-30 VITALS — BP 132/82 | HR 86 | Temp 98.0°F | Resp 16 | Ht 60.0 in | Wt 155.0 lb

## 2020-06-30 DIAGNOSIS — E559 Vitamin D deficiency, unspecified: Secondary | ICD-10-CM

## 2020-06-30 DIAGNOSIS — Z1211 Encounter for screening for malignant neoplasm of colon: Secondary | ICD-10-CM

## 2020-06-30 DIAGNOSIS — L259 Unspecified contact dermatitis, unspecified cause: Secondary | ICD-10-CM

## 2020-06-30 DIAGNOSIS — R918 Other nonspecific abnormal finding of lung field: Secondary | ICD-10-CM

## 2020-06-30 DIAGNOSIS — E669 Obesity, unspecified: Secondary | ICD-10-CM

## 2020-06-30 DIAGNOSIS — Z1231 Encounter for screening mammogram for malignant neoplasm of breast: Secondary | ICD-10-CM | POA: Diagnosis not present

## 2020-06-30 DIAGNOSIS — Z79899 Other long term (current) drug therapy: Secondary | ICD-10-CM

## 2020-06-30 DIAGNOSIS — E039 Hypothyroidism, unspecified: Secondary | ICD-10-CM

## 2020-06-30 DIAGNOSIS — R739 Hyperglycemia, unspecified: Secondary | ICD-10-CM

## 2020-06-30 DIAGNOSIS — E7800 Pure hypercholesterolemia, unspecified: Secondary | ICD-10-CM

## 2020-06-30 DIAGNOSIS — J439 Emphysema, unspecified: Secondary | ICD-10-CM

## 2020-06-30 DIAGNOSIS — R7303 Prediabetes: Secondary | ICD-10-CM

## 2020-06-30 DIAGNOSIS — J449 Chronic obstructive pulmonary disease, unspecified: Secondary | ICD-10-CM

## 2020-06-30 MED ORDER — TRIAMCINOLONE ACETONIDE 0.5 % EX OINT: 1.0000 "application " | TOPICAL_OINTMENT | Freq: Two times a day (BID) | CUTANEOUS | 0 refills | Status: DC

## 2020-06-30 MED ORDER — TRELEGY ELLIPTA 100-62.5-25 MCG/INH IN AEPB: 1.0000 | INHALATION_SPRAY | Freq: Every day | RESPIRATORY_TRACT | 1 refills | Status: AC

## 2020-07-01 ENCOUNTER — Telehealth: Payer: Self-pay

## 2020-07-01 NOTE — Telephone Encounter (Signed)
Copied from [place] [id number]. Topic: Referral - Status >> Jun 30, 2020  4:52 PM Yvette Rack wrote: Reason for CRM: [facility] called to report that patient has not been seen there and reports patient was seen by Dr. Ottie Glazier. [facility] asked that the referral be sent to Dr. Ottie Glazier.

## 2020-07-02 ENCOUNTER — Other Ambulatory Visit: Payer: Self-pay | Admitting: Family Medicine

## 2020-07-02 DIAGNOSIS — J449 Chronic obstructive pulmonary disease, unspecified: Secondary | ICD-10-CM

## 2020-07-02 DIAGNOSIS — R918 Other nonspecific abnormal finding of lung field: Secondary | ICD-10-CM

## 2020-07-02 NOTE — Telephone Encounter (Signed)
Yes please place new referral.  Thanks

## 2020-07-21 ENCOUNTER — Encounter: Payer: Self-pay | Admitting: *Deleted

## 2020-08-13 ENCOUNTER — Other Ambulatory Visit: Payer: Self-pay | Admitting: Pulmonary Disease

## 2020-08-13 DIAGNOSIS — R0600 Dyspnea, unspecified: Secondary | ICD-10-CM

## 2020-08-14 LAB — LIPID PANEL
Chol/HDL Ratio: 3.5 ratio (ref 0.0–4.4)
Cholesterol, Total: 208 mg/dL — ABNORMAL HIGH (ref 100–199)
HDL: 59 mg/dL (ref >39)
LDL Chol Calc (NIH): 139 mg/dL — ABNORMAL HIGH (ref 0–99)
Triglycerides: 57 mg/dL (ref 0–149)
VLDL Cholesterol Cal: 10 mg/dL (ref 5–40)

## 2020-08-14 LAB — TSH: TSH: 10.8 u[IU]/mL — ABNORMAL HIGH (ref 0.450–4.500)

## 2020-08-14 LAB — COMPREHENSIVE METABOLIC PANEL
ALT: 59 IU/L — ABNORMAL HIGH (ref 0–32)
AST: 64 IU/L — ABNORMAL HIGH (ref 0–40)
Albumin/Globulin Ratio: 1.4 (ref 1.2–2.2)
Albumin: 4.5 g/dL (ref 3.8–4.9)
Alkaline Phosphatase: 132 IU/L — ABNORMAL HIGH (ref 44–121)
BUN/Creatinine Ratio: 16 (ref 9–23)
BUN: 14 mg/dL (ref 6–24)
Bilirubin Total: 0.3 mg/dL (ref 0.0–1.2)
CO2: 25 mmol/L (ref 20–29)
Calcium: 9.8 mg/dL (ref 8.7–10.2)
Chloride: 104 mmol/L (ref 96–106)
Creatinine, Ser: 0.86 mg/dL (ref 0.57–1.00)
Globulin, Total: 3.2 g/dL (ref 1.5–4.5)
Glucose: 88 mg/dL (ref 65–99)
Potassium: 4.6 mmol/L (ref 3.5–5.2)
Sodium: 142 mmol/L (ref 134–144)
Total Protein: 7.7 g/dL (ref 6.0–8.5)
eGFR: 78 mL/min/{1.73_m2} (ref >59)

## 2020-08-14 LAB — HEMOGLOBIN A1C
Est. average glucose Bld gHb Est-mCnc: 140 mg/dL
Hgb A1c MFr Bld: 6.5 % — ABNORMAL HIGH (ref 4.8–5.6)

## 2020-08-14 LAB — VITAMIN D 25 HYDROXY (VIT D DEFICIENCY, FRACTURES): Vit D, 25-Hydroxy: 26.5 ng/mL — ABNORMAL LOW (ref 30.0–100.0)

## 2020-08-17 ENCOUNTER — Other Ambulatory Visit: Payer: Self-pay

## 2020-08-17 ENCOUNTER — Other Ambulatory Visit: Payer: Self-pay | Admitting: Family Medicine

## 2020-08-17 ENCOUNTER — Telehealth: Payer: Self-pay

## 2020-08-17 MED ORDER — LEVOTHYROXINE SODIUM 150 MCG PO TABS: 150.0000 ug | ORAL_TABLET | Freq: Every day | ORAL | 0 refills | Status: DC

## 2020-08-17 NOTE — Telephone Encounter (Signed)
Left voicemail for patient to return our call regarding lab results and questions about her medication regime per Dr. Ancil Boozer. Awaiting return call.

## 2020-08-17 NOTE — Telephone Encounter (Signed)
-----   Message from Steele Sizer, MD sent at 08/16/2020  6:47 PM EDT ----- Please call patient : I need to know how she is taking levothyroxine over the past 6 weeks so I can adjust dose. We don't need to start her on diabetes medication, but ask if willing to take statins A1C is now in diabetes range Lipid panel improved but not at goal since now you have diabetes, goal is below 70 TSH is not at goal, still over 10, I will need to adjust the dose if you have been taking it daily  Liver enzymes a little elevated, but stable,  Vitamin D a little low but otc vitamin D 2000 units daily is enough

## 2020-08-17 NOTE — Telephone Encounter (Signed)
Spoke with patient. Relayed lab results per Dr.Sowles. Patient verbalized understanding and declined statin and diabetic recommendations at this time. She would like to monitor A1C and lipid levels and at a later date may decide to proceed with intervention if necessary. Patient stated she has bee taking her levothyroxine sporadically and could not give me a definitive answer as to consistency/dosing as she stated she was trying to make her pills last until her appointment as she was running out. She would like to continue on her previous regimine and redraw labs in a few weeks for adjustments.

## 2020-08-17 NOTE — Telephone Encounter (Signed)
Attempted to reach patient, left voicemail for return call.

## 2020-08-17 NOTE — Telephone Encounter (Signed)
Pt returned your call.  

## 2020-08-17 NOTE — Telephone Encounter (Signed)
-----   Message from Krichna Sowles, MD sent at 08/16/2020  6:47 PM EDT ----- Please call patient : I need to know how she is taking levothyroxine over the past 6 weeks so I can adjust dose. We don't need to start her on diabetes medication, but ask if willing to take statins A1C is now in diabetes range Lipid panel improved but not at goal since now you have diabetes, goal is below 70 TSH is not at goal, still over 10, I will need to adjust the dose if you have been taking it daily  Liver enzymes a little elevated, but stable,  Vitamin D a little low but otc vitamin D 2000 units daily is enough  

## 2020-08-18 ENCOUNTER — Other Ambulatory Visit: Payer: Self-pay | Admitting: Family Medicine

## 2020-08-18 DIAGNOSIS — E039 Hypothyroidism, unspecified: Secondary | ICD-10-CM

## 2020-08-18 NOTE — Telephone Encounter (Signed)
Pt informed of prescription being sent to pharmacy

## 2020-09-22 ENCOUNTER — Ambulatory Visit: Payer: Self-pay | Admitting: *Deleted

## 2020-09-22 ENCOUNTER — Ambulatory Visit: Admission: RE | Admit: 2020-09-22 | Payer: 59 | Source: Ambulatory Visit

## 2020-09-22 NOTE — Telephone Encounter (Signed)
I returned pt's call.   She has a stuffy nose.  Went and had a Covid test done and found out this morning she is positive for Covid.   I was just in the [place] with a friend.   While there she told me she didn't feel good.    "I feel fine other than a stuffy nose".    "I was surprised I am  positive".    "I had Covid in Jan. 2021 and was in the hospital with it very sick.    Pt has COPD and asthma so her supervisor told her to call her dr so that's why she's calling.     "I think I'm fine".   I went over the care advice and quarantine protocol with her and answered her questions.   She thanked me for calling her back.  She is faxing a form over to [facility] she needs filled out.   I gave her the fax number.  I went over the s/s to call back for or go to the ED should they occur.   She verbalized understanding.  I sent my notes to [place] for Dr. Ancil Boozer for her information.

## 2020-09-22 NOTE — Telephone Encounter (Signed)
Reason for Disposition  [1] COVID-19 diagnosed by positive lab test (e.g., PCR, rapid self-test kit) AND [2] mild symptoms (e.g., cough, fever, others) AND [2] no complications or SOB    Only a stuffy nose and a positive Covid test.   "I feel fine".  Answer Assessment - Initial Assessment Questions 1. COVID-19 DIAGNOSIS: "Who made your COVID-19 diagnosis?" "Was it confirmed by a positive lab test or self-test?" If not diagnosed by a doctor (or NP/PA), ask "Are there lots of cases (community spread) where you live?" Note: See public health department website, if unsure.     Positive for Covid.   I have COPD and asthma.    I had Covid Jan. 2021 and was in the hospital. I have a stuffy nose and I got tested and just found out I am positive. 2. COVID-19 EXPOSURE: "Was there any known exposure to COVID before the symptoms began?" CDC Definition of close contact: within 6 feet (2 meters) for a total of 15 minutes or more over a 24-hour period.      *No Answer* 3. ONSET: "When did the COVID-19 symptoms start?"      Yesterday woke up with a stuffy nose.  [place] ended up having Covid. 4. WORST SYMPTOM: "What is your worst symptom?" (e.g., cough, fever, shortness of breath, muscle aches)     Stuffy nose 5. COUGH: "Do you have a cough?" If Yes, ask: "How bad is the cough?"       No  Just a normal asthma cough. 6. FEVER: "Do you have a fever?" If Yes, ask: "What is your temperature, how was it measured, and when did it start?"     No 7. RESPIRATORY STATUS: "Describe your breathing?" (e.g., shortness of breath, wheezing, unable to speak)      I have asthma.   No shortness of breath or wheezing. 8. BETTER-SAME-WORSE: "Are you getting better, staying the same or getting worse compared to yesterday?"  If getting worse, ask, "In what way?"     Better today 9. HIGH RISK DISEASE: "Do you have any chronic medical problems?" (e.g., asthma, heart or lung disease, weak  immune system, obesity, etc.)     Asthma.   My supervisor told me to call my dr. Lajoyce Corners. VACCINE: "Have you had the COVID-19 vaccine?" If Yes, ask: "Which one, how many shots, when did you get it?"     Not asked at this point 11. BOOSTER: "Have you received your COVID-19 booster?" If Yes, ask: "Which one and when did you get it?"       Not asked 12. PREGNANCY: "Is there any chance you are pregnant?" "When was your last menstrual period?"       N/A due to age 60. OTHER SYMPTOMS: "Do you have any other symptoms?"  (e.g., chills, fatigue, headache, loss of smell or taste, muscle pain, sore throat)       "Just a stuffy nose".    "I feel fine". 14. O2 SATURATION MONITOR:  "Do you use an oxygen saturation monitor (pulse oximeter) at home?" If Yes, ask "What is your reading (oxygen level) today?" "What is your usual oxygen saturation reading?" (e.g., 95%)       Not asked  Protocols used: Coronavirus (COVID-19) Diagnosed or Suspected-A-AH

## 2020-09-24 NOTE — Telephone Encounter (Signed)
Made appt with [facility] on 09-27-2020. Pt notified

## 2020-09-24 NOTE — Telephone Encounter (Signed)
Please inform patient that per Dr. Ancil Boozer she needs to have a visit (it can be virtual) to complete her FMLA paperwork.

## 2020-09-27 ENCOUNTER — Other Ambulatory Visit: Payer: Self-pay

## 2020-09-27 ENCOUNTER — Encounter: Payer: Self-pay | Admitting: Family Medicine

## 2020-09-27 ENCOUNTER — Telehealth (INDEPENDENT_AMBULATORY_CARE_PROVIDER_SITE_OTHER): Payer: 59 | Admitting: Family Medicine

## 2020-09-27 DIAGNOSIS — U071 COVID-19: Secondary | ICD-10-CM

## 2020-09-27 NOTE — Patient Instructions (Signed)
It was great to see you!  Our plans for today:  - See below for self-isolation guidelines. You may end your quarantine once you are 5 days from symptom onset and fever free for 24 hours without use of tylenol or ibuprofen.  - I recommend getting vaccinated once you are healed from your current infection. - We completed leave forms for your work. - Certainly, if you are having difficulties breathing or unable to keep down fluids, go to the Emergency Department.   Take care and seek immediate care sooner if you develop any concerns.   Dr. Ky Barban     Person Under Monitoring Name: Maria Richardson  Location: [address] [place] [address]   Infection Prevention Recommendations for Individuals Confirmed to have, or Being Evaluated for, 2019 Novel Coronavirus (COVID-19) Infection Who Receive Care at Home  Individuals who are confirmed to have, or are being evaluated for, COVID-19 should follow the prevention steps below until a healthcare provider or local or state health department says they can return to normal activities.  Stay home except to get medical care You should restrict activities outside your home, except for getting medical care. Do not go to work, school, or public areas, and do not use public transportation or taxis.  Call ahead before visiting your doctor Before your medical appointment, call the healthcare provider and tell them that you have, or are being evaluated for, COVID-19 infection. This will help the healthcare provider's office take steps to keep other people from getting infected. Ask your healthcare provider to call the local or state health department.  Monitor your symptoms Seek prompt medical attention if your illness is worsening (e.g., difficulty breathing). Before going to your medical appointment, call the healthcare provider and tell them that you have, or are being evaluated for, COVID-19 infection. Ask your healthcare provider to  call the local or state health department.  Wear a facemask You should wear a facemask that covers your nose and mouth when you are in the same room with other people and when you visit a healthcare provider. People who live with or visit you should also wear a facemask while they are in the same room with you.  Separate yourself from other people in your home As much as possible, you should stay in a different room from other people in your home. Also, you should use a separate bathroom, if available.  Avoid sharing household items You should not share dishes, drinking glasses, cups, eating utensils, towels, bedding, or other items with other people in your home. After using these items, you should wash them thoroughly with soap and water.  Cover your coughs and sneezes Cover your mouth and nose with a tissue when you cough or sneeze, or you can cough or sneeze into your sleeve. Throw used tissues in a lined trash can, and immediately wash your hands with soap and water for at least 20 seconds or use an alcohol-based hand rub.  Wash your [facility] your hands often and thoroughly with soap and water for at least 20 seconds. You can use an alcohol-based hand sanitizer if soap and water are not available and if your hands are not visibly dirty. Avoid touching your eyes, nose, and mouth with unwashed hands.   Prevention Steps for Caregivers and Household Members of Individuals Confirmed to have, or Being Evaluated for, COVID-19 Infection Being Cared for in the Home  If you live with, or provide care at home for, a  person confirmed to have, or being evaluated for, COVID-19 infection please follow these guidelines to prevent infection:  Follow healthcare provider's instructions Make sure that you understand and can help the patient follow any healthcare provider instructions for all care.  Provide for the patient's basic needs You should help the patient with basic needs in the home  and provide support for getting groceries, prescriptions, and other personal needs.  Monitor the patient's symptoms If they are getting sicker, call his or her medical provider and tell them that the patient has, or is being evaluated for, COVID-19 infection. This will help the healthcare provider's office take steps to keep other people from getting infected. Ask the healthcare provider to call the local or state health department.  Limit the number of people who have contact with the patient If possible, have only one caregiver for the patient. Other household members should stay in another home or place of residence. If this is not possible, they should stay in another room, or be separated from the patient as much as possible. Use a separate bathroom, if available. Restrict visitors who do not have an essential need to be in the home.  Keep older adults, very young children, and other sick people away from the patient Keep older adults, very young children, and those who have compromised immune systems or chronic health conditions away from the patient. This includes people with chronic heart, lung, or kidney conditions, diabetes, and cancer.  Ensure good ventilation Make sure that shared spaces in the home have good air flow, such as from an air conditioner or an opened window, weather permitting.  Wash your hands often Wash your hands often and thoroughly with soap and water for at least 20 seconds. You can use an alcohol based hand sanitizer if soap and water are not available and if your hands are not visibly dirty. Avoid touching your eyes, nose, and mouth with unwashed hands. Use disposable paper towels to dry your hands. If not available, use dedicated cloth towels and replace them when they become wet.  Wear a facemask and gloves Wear a disposable facemask at all times in the room and gloves when you touch or have contact with the patient's blood, body fluids, and/or secretions  or excretions, such as sweat, saliva, sputum, nasal mucus, vomit, urine, or feces.  Ensure the mask fits over your nose and mouth tightly, and do not touch it during use. Throw out disposable facemasks and gloves after using them. Do not reuse. Wash your hands immediately after removing your facemask and gloves. If your personal clothing becomes contaminated, carefully remove clothing and launder. Wash your hands after handling contaminated clothing. Place all used disposable facemasks, gloves, and other waste in a lined container before disposing them with other household waste. Remove gloves and wash your hands immediately after handling these items.  Do not share dishes, glasses, or other household items with the patient Avoid sharing household items. You should not share dishes, drinking glasses, cups, eating utensils, towels, bedding, or other items with a patient who is confirmed to have, or being evaluated for, COVID-19 infection. After the person uses these items, you should wash them thoroughly with soap and water.  Wash laundry thoroughly Immediately remove and wash clothes or bedding that have blood, body fluids, and/or secretions or excretions, such as sweat, saliva, sputum, nasal mucus, vomit, urine, or feces, on them. Wear gloves when handling laundry from the patient. Read and follow directions on labels of laundry or  clothing items and detergent. In general, wash and dry with the warmest temperatures recommended on the label.  Clean all areas the individual has used often Clean all touchable surfaces, such as counters, tabletops, doorknobs, bathroom fixtures, toilets, phones, keyboards, tablets, and bedside tables, every day. Also, clean any surfaces that may have blood, body fluids, and/or secretions or excretions on them. Wear gloves when cleaning surfaces the patient has come in contact with. Use a diluted bleach solution (e.g., dilute bleach with 1 part bleach and 10 parts  water) or a household disinfectant with a label that says EPA-registered for coronaviruses. To make a bleach solution at home, add 1 tablespoon of bleach to 1 quart (4 cups) of water. For a larger supply, add  cup of bleach to 1 gallon (16 cups) of water. Read labels of cleaning products and follow recommendations provided on product labels. Labels contain instructions for safe and effective use of the cleaning product including precautions you should take when applying the product, such as wearing gloves or eye protection and making sure you have good ventilation during use of the product. Remove gloves and wash hands immediately after cleaning.  Monitor yourself for signs and symptoms of illness Caregivers and household members are considered close contacts, should monitor their health, and will be asked to limit movement outside of the home to the extent possible. Follow the monitoring steps for close contacts listed on the symptom monitoring form.   ? If you have additional questions, contact your local health department or call the epidemiologist on call at [phone] (available 24/7). ? This guidance is subject to change. For the most up-to-date guidance from [facility], please refer to their website: [email or web address]

## 2020-09-27 NOTE — Progress Notes (Signed)
Virtual Visit via Video Note  I connected with SHANAH GUIMARAES on 09/27/20 at  2:40 PM EDT by a video enabled telemedicine application and verified that I am speaking with the correct person using two identifiers.  Location: Patient: home Provider: [facility]   I discussed the limitations of evaluation and management by telemedicine and the availability of in person appointments. The patient expressed understanding and agreed to proceed.  History of Present Illness:  COVID-19 INFECTION - COVID+ 6/21 with prior COVID PNA 04/2019 requiring hospitalization. 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 No resp distress.  Assessment and Plan:  [id number] Doing well with mild sx, improving. Not a candidate for COVID antiviral treatment as outside treatment window. Recommended vaccination, refused. Reviewed OTC symptom relief, self-quarantine guidelines, and emergency precautions. FMLA forms completed for work.     I discussed the assessment and treatment plan with the patient. The patient was provided an opportunity to ask questions and all were answered. The patient agreed with the plan and demonstrated an understanding of the instructions.   The patient was advised to call back or seek an in-person evaluation if the symptoms worsen or if the condition fails to improve as anticipated.  I provided 10 minutes of non-face-to-face time during this encounter.   Myles Gip, DO

## 2020-11-26 ENCOUNTER — Other Ambulatory Visit: Payer: Self-pay | Admitting: Family Medicine

## 2020-12-16 NOTE — Progress Notes (Deleted)
Name: Maria Richardson   MRN: [id number]    DOB: [birth year]   Date:12/16/2020       Progress Note  Subjective  Chief Complaint  Annual Exam  HPI  Patient presents for annual CPE.  Diet: *** Exercise: ***   Flowsheet Row Video Visit from 09/27/2020 in [facility]  AUDIT-C Score 0      Depression: Phq 9 is  {Desc; negative/positive:13464} Depression screen [facility] 2/9 09/27/2020 06/30/2020 12/12/2019 04/29/2019 10/17/2018  Decreased Interest 0 0 0 0 0  Down, Depressed, Hopeless 0 0 0 0 0  PHQ - 2 Score 0 0 0 0 0  Altered sleeping - - - 0 0  Tired, decreased energy - - - 0 0  Change in appetite - - - 0 0  Feeling bad or failure about yourself  - - - 0 0  Trouble concentrating - - - 0 0  Moving slowly or fidgety/restless - - - 0 0  Suicidal thoughts - - - 0 0  PHQ-9 Score - - - 0 0  Difficult doing work/chores - - - - Somewhat difficult   Hypertension: BP Readings from Last 3 Encounters:  06/30/20 132/82  12/31/19 122/74  12/12/19 130/80   Obesity: Wt Readings from Last 3 Encounters:  06/30/20 155 lb (70.3 kg)  12/31/19 161 lb 3.2 oz (73.1 kg)  12/12/19 159 lb 6.4 oz (72.3 kg)   BMI Readings from Last 3 Encounters:  06/30/20 30.27 kg/m  12/31/19 31.48 kg/m  12/12/19 31.13 kg/m     Vaccines:  HPV: up to at age 34 , ask insurance if age between 46-45  Shingrix: 60-64 yo and ask insurance if covered when patient above 45 yo Pneumonia: educated and discussed with patient. Flu: educated and discussed with patient.  Hep C Screening: 04/29/12 STD testing and prevention (HIV/chl/gon/syphilis): 04/22/19 Intimate partner violence: negative Sexual History : Menstrual History/LMP/Abnormal Bleeding:  Incontinence Symptoms:   Breast cancer:  - Last Mammogram: ordered 06/30/20 - BRCA gene screening: N/A  Osteoporosis: Discussed high calcium and vitamin D supplementation, weight bearing exercises  Cervical cancer screening: 12/12/19  Skin cancer: Discussed  monitoring for atypical lesions  Colorectal cancer: 07/23/15   Lung cancer: Low Dose CT Chest recommended if Age 16-80 years, 20 pack-year currently smoking OR have quit w/in 15years. Patient does not qualify.   ECG: 05/23/19  Advanced Care Planning: A voluntary discussion about advance care planning including the explanation and discussion of advance directives.  Discussed health care proxy and Living will, and the patient was able to identify a health care proxy as ***.  Patient does not have a living will at present time. If patient does have living will, I have requested they bring this to the clinic to be scanned in to their chart.  Lipids: Lab Results  Component Value Date   CHOL 208 (H) 08/13/2020   CHOL 292 (H) 12/12/2019   CHOL 199 02/21/2018   Lab Results  Component Value Date   HDL 59 08/13/2020   HDL 64 12/12/2019   HDL 50 02/21/2018   Lab Results  Component Value Date   LDLCALC 139 (H) 08/13/2020   LDLCALC 211 (H) 12/12/2019   LDLCALC 137 (H) 02/21/2018   Lab Results  Component Value Date   TRIG 57 08/13/2020   TRIG 99 12/12/2019   TRIG 66 04/21/2019   Lab Results  Component Value Date   CHOLHDL 3.5 08/13/2020   CHOLHDL 4.6 (H) 12/12/2019   CHOLHDL 4.0 02/21/2018  No results found for: LDLDIRECT  Glucose: Glucose  Date Value Ref Range Status  08/13/2020 88 65 - 99 mg/dL Final  12/12/2019 84 65 - 99 mg/dL Final   Glucose, Bld  Date Value Ref Range Status  05/30/2019 169 (H) 70 - 99 mg/dL Final    Comment:    Glucose reference range applies only to samples taken after fasting for at least 8 hours.  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   Vitamin D deficiency 05/18/2009    Past Surgical History:  Procedure Laterality Date   APPENDECTOMY     COLONOSCOPY WITH PROPOFOL N/A 07/23/2015   Procedure: COLONOSCOPY WITH PROPOFOL;  Surgeon: Lucilla Lame, MD;  Location: [place];  Service: Endoscopy;  Laterality: N/A;  PLEASE LEAVE PT AT LATER AM    OOPHORECTOMY     as teenager   POLYPECTOMY  07/23/2015   Procedure: POLYPECTOMY;  Surgeon: Lucilla Lame, MD;  Location: [place];  Service: Endoscopy;;    Family History  Problem Relation Age of Onset   Diabetes Father    Hypertension Father    Kidney disease Father    Colon cancer Father     Social History   Socioeconomic History   Marital status: Married    Spouse name: Richard    Number of children: 0   Years of education: Not on file   Highest education level: 12th grade  Occupational History   Occupation: Hotel manager: [employer]  Tobacco Use   Smoking status: Former    Packs/day: 0.50    Years: 30.00    Pack years: 15.00    Types: Cigarettes    Quit date: 04/27/1998    Years since quitting: 22.6   Smokeless tobacco: Never  Vaping Use   Vaping Use: Never used  Substance and Sexual Activity   Alcohol use: No    Alcohol/week: 0.0 standard drinks   Drug use: No   Sexual activity: Not Currently    Comment: no desire  Other Topics Concern   Not on file  Social History Narrative   Husband has a history of alcohol use and sometimes drug use and she gets scared of getting exposed to STI and sometimes she is scared when he is under the influence.    Never had children - but has step children and step grandchildren    Raised her cousin, still lives at home   Social Determinants of  Health   Financial Resource Strain: Not on file  Food Insecurity: Not on file  Transportation Needs: Not on file  Physical Activity: Not on file  Stress: Not on file  Social Connections: Not on file  Intimate Partner Violence: Not on file     Current Outpatient Medications:    ascorbic acid (VITAMIN C) 500 MG tablet, Take 1 tablet (500 mg total) by mouth daily.,  Disp: 30 tablet, Rfl: 0   Fluticasone-Umeclidin-Vilant (TRELEGY ELLIPTA) 100-62.5-25 MCG/INH AEPB, Take 1 puff by mouth daily., Disp: 180 each, Rfl: 1   levothyroxine (SYNTHROID) 150 MCG tablet, TAKE 1 TABLET(150 MCG) BY MOUTH DAILY, Disp: 90 tablet, Rfl: 0   triamcinolone ointment (KENALOG) 0.5 %, Apply 1 application topically 2 (two) times daily., Disp: 60 g, Rfl: 0   Vitamin D, Ergocalciferol, (DRISDOL) 1.25 MG (50000 UNIT) CAPS capsule, TAKE 1 CAPSULE BY MOUTH EVERY 7 DAYS, Disp: 12 capsule, Rfl: 1  Allergies  Allergen Reactions   Codeine Nausea And Vomiting     ROS  ***  Objective  There were no vitals filed for this visit.  There is no height or weight on file to calculate BMI.  Physical Exam ***  No results found for this or any previous visit (from the past 2160 hour(s)).    Fall Risk: Fall Risk  09/27/2020 06/30/2020 01/15/2020 12/12/2019 04/29/2019  Falls in the past year? 0 0 0 0 0  Number falls in past yr: 0 0 0 0 0  Injury with Fall? 0 0 0 0 0  Risk for fall due to : - - No Fall Risks - -  Follow up Falls evaluation completed - Falls evaluation completed;Falls prevention discussed;Education provided - -     Functional Status Survey:     Assessment & Plan  1. Well adult exam ***   -USPSTF grade A and B recommendations reviewed with patient; age-appropriate recommendations, preventive care, screening tests, etc discussed and encouraged; healthy living encouraged; see AVS for patient education given to patient -Discussed importance of 150 minutes of physical activity weekly, eat two  servings of fish weekly, eat one serving of tree nuts ( cashews, pistachios, pecans, almonds.Marland Kitchen) every other day, eat 6 servings of fruit/vegetables daily and drink plenty of water and avoid sweet beverages.   -Reviewed Health Maintenance: ***

## 2020-12-17 ENCOUNTER — Encounter: Payer: Self-pay | Admitting: Family Medicine

## 2021-02-03 IMAGING — DX DG CHEST 1V PORT
1 series · 1 of 1 positions shown · non-contrast
Comparison: 09/14/2017

CLINICAL DATA: Hypoxia

EXAM:
PORTABLE CHEST 1 VIEW

[chest ap]
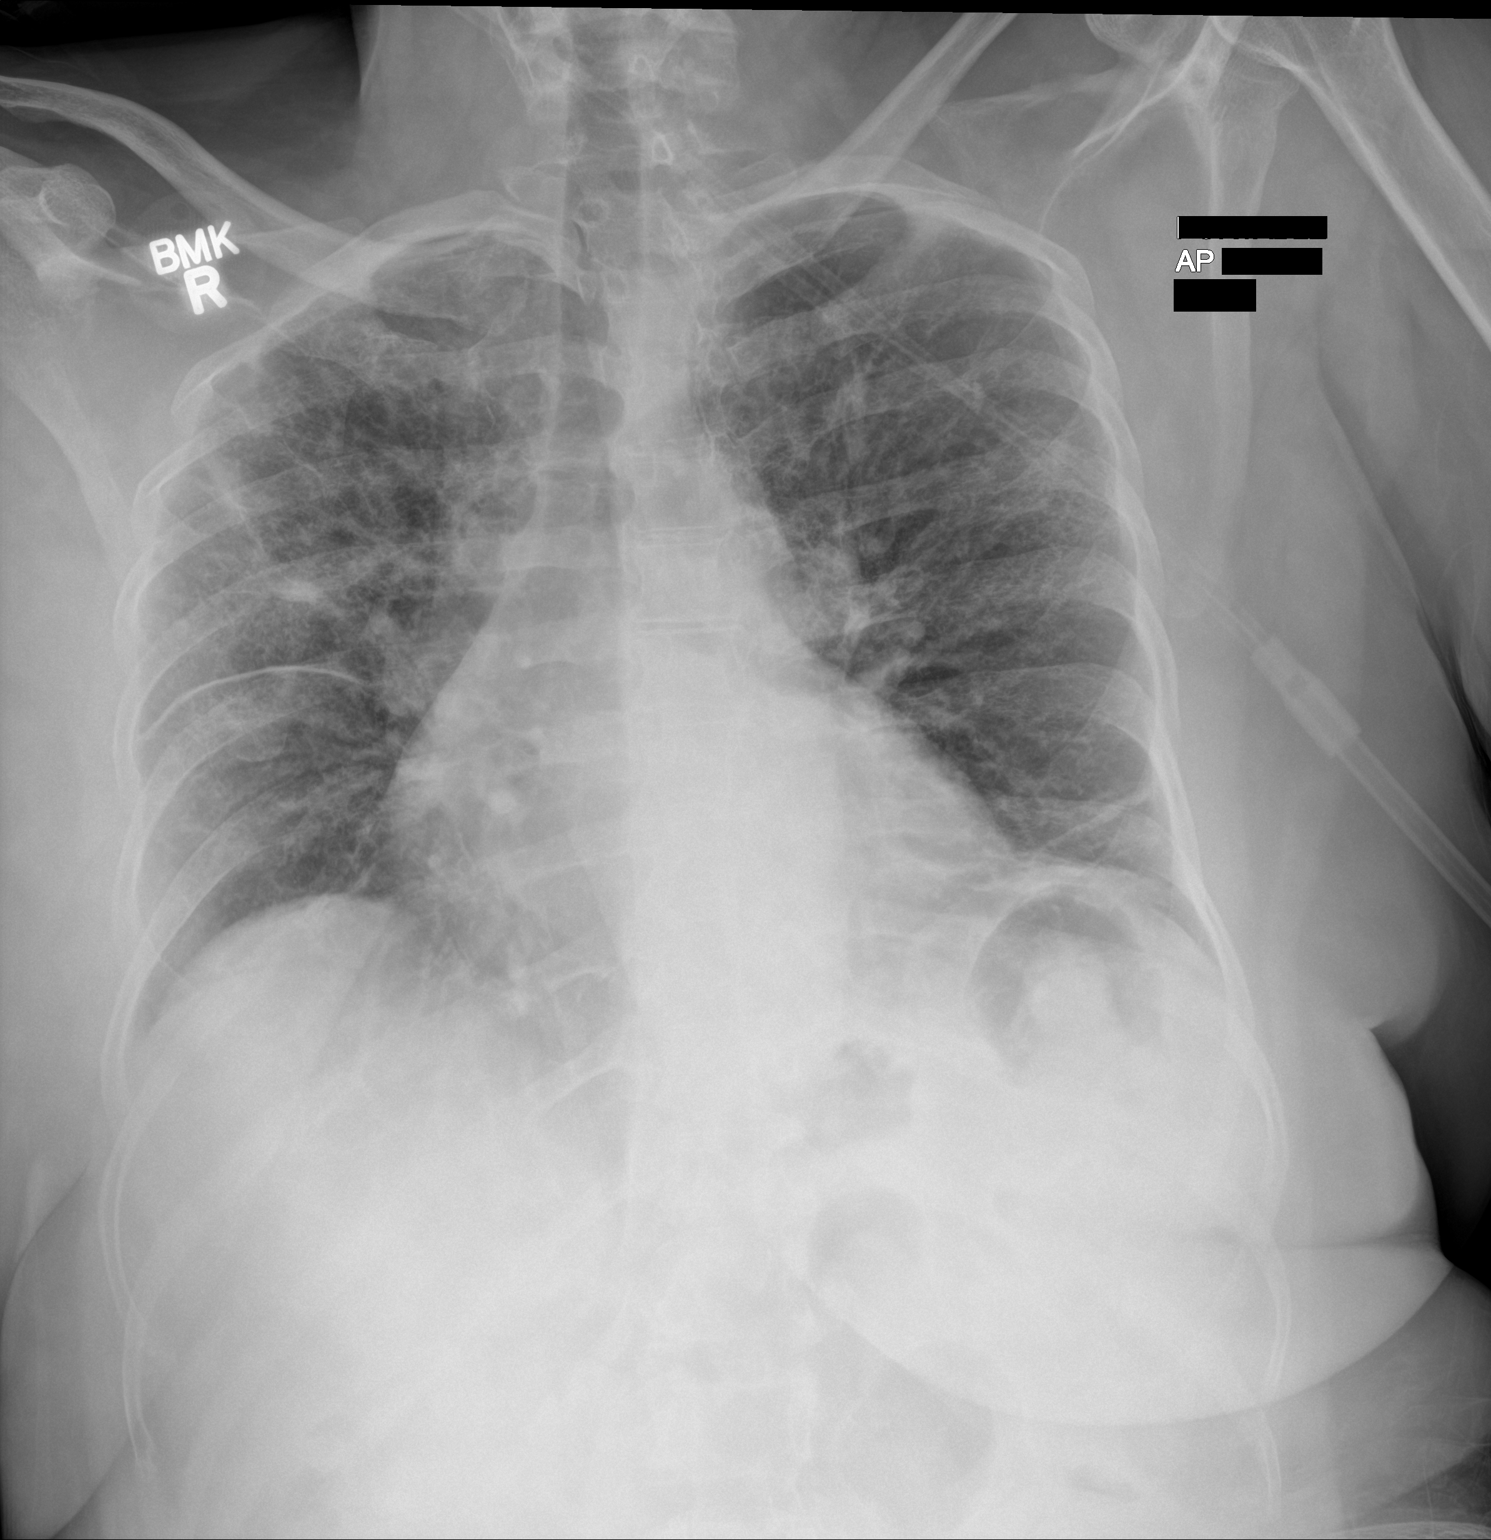

[1 of 1 positions shown; findings below may reference images not displayed]

FINDINGS: Bilateral upper lobe predominant interstitial opacities, right
greater than left. No pleural effusion or pneumothorax. Normal
cardiomediastinal contours.
IMPRESSION: Bilateral upper lobe predominant opacities, which may indicate
multifocal infection.

## 2021-02-04 ENCOUNTER — Ambulatory Visit: Payer: Self-pay | Admitting: *Deleted

## 2021-02-04 NOTE — Telephone Encounter (Signed)
Patient is calling to report she has has blood in stool for 2 weeks. Patient states she sees blood with every bowel movement. Denies symptoms- fatigue, dizziness, no diarrhea or constipation. Appointment offered today but patient can not come- she request appointment Monday.

## 2021-02-04 NOTE — Telephone Encounter (Signed)
Reason for Disposition  MODERATE rectal bleeding (small blood clots, passing blood without stool, or toilet water turns red)  Answer Assessment - Initial Assessment Questions 1. APPEARANCE of BLOOD: "What color is it?" "Is it passed separately, on the surface of the stool, or mixed in with the stool?"      Combination blood after 2. AMOUNT: "How much blood was passed?"      Small amounts 3. FREQUENCY: "How many times has blood been passed with the stools?"      Every time she has bowel movement 4. ONSET: "When was the blood first seen in the stools?" (Days or weeks)      2 weeks 5. DIARRHEA: "Is there also some diarrhea?" If Yes, ask: "How many diarrhea stools in the past 24 hours?"      no 6. CONSTIPATION: "Do you have constipation?" If Yes, ask: "How bad is it?"     no 7. RECURRENT SYMPTOMS: "Have you had blood in your stools before?" If Yes, ask: "When was the last time?" and "What happened that time?"      Yes- stopped- no treatment 8. BLOOD THINNERS: "Do you take any blood thinners?" (e.g., Coumadin/warfarin, Pradaxa/dabigatran, aspirin)     no 9. OTHER SYMPTOMS: "Do you have any other symptoms?"  (e.g., abdomen pain, vomiting, dizziness, fever)     no 10. PREGNANCY: "Is there any chance you are pregnant?" "When was your last menstrual period?"       na  Protocols used: Rectal Bleeding-A-AH

## 2021-02-07 ENCOUNTER — Ambulatory Visit: Payer: 59 | Admitting: Family Medicine

## 2021-02-07 ENCOUNTER — Other Ambulatory Visit: Payer: Self-pay

## 2021-02-07 ENCOUNTER — Encounter: Payer: Self-pay | Admitting: Family Medicine

## 2021-02-07 VITALS — BP 116/68 | HR 84 | Temp 98.1°F | Resp 16 | Ht 60.0 in | Wt 152.2 lb

## 2021-02-07 DIAGNOSIS — K625 Hemorrhage of anus and rectum: Secondary | ICD-10-CM | POA: Diagnosis not present

## 2021-02-07 DIAGNOSIS — Z1211 Encounter for screening for malignant neoplasm of colon: Secondary | ICD-10-CM

## 2021-02-07 LAB — HEMOCCULT GUIAC POC 1CARD (OFFICE)
Card #1 Date: 11072022
Fecal Occult Blood, POC: POSITIVE — AB

## 2021-02-07 NOTE — Assessment & Plan Note (Signed)
No gross bleeding on exam, FOBT+. Due for colonscopy, referral to GI generated. Check CBC for anemia.

## 2021-02-07 NOTE — Patient Instructions (Signed)
It was great to see you!  Our plans for today:  - We are referring you to GI to repeat your colonoscopy.  - We are checking some labs today, we will release these results to your MyChart.  Take care and seek immediate care sooner if you develop any concerns.   Dr. Ky Barban

## 2021-02-07 NOTE — Progress Notes (Signed)
   SUBJECTIVE:   CHIEF COMPLAINT / HPI:   RECTAL BLEEDING - mixed in with stool, some loose stools Duration: 2 weeks  Bright red rectal bleeding: yes  Amount of blood: minimal  Frequency: with every stool Melena: no  Spotting on toilet tissue: yes  Anal fullness: no  Perianal pain: no  Perianal irritation/itching: no  Constipation:  years ago, not recently   Chronic straining/valsava:  no  Anal trauma/intercourse: no  Hemorrhoids: no  Previous colonoscopy: yes, single sigmoid adenomatous polyp 07/2015, f/u colonoscopy in 5 years.  Denies shortness of breath, chest pain, fatigue.  OBJECTIVE:   BP 116/68   Pulse 84   Temp 98.1 F (36.7 C)   Resp 16   Ht 5' (1.524 m)   Wt 152 lb 3.2 oz (69 kg)   SpO2 96%   BMI 29.72 kg/m   Gen: well appearing, in NAD Rectal exam: negative without mass, lesions or tenderness. Skin tag noted. Sphincter tone normal. Stool guaiac positive.   ASSESSMENT/PLAN:   Rectal bleeding No gross bleeding on exam, FOBT+. Due for colonscopy, referral to GI generated. Check CBC for anemia.     Myles Gip, DO

## 2021-02-08 LAB — CBC WITH DIFFERENTIAL/PLATELET
Basophils Absolute: 0.1 10*3/uL (ref 0.0–0.2)
Basos: 1 %
EOS (ABSOLUTE): 0.5 10*3/uL — ABNORMAL HIGH (ref 0.0–0.4)
Eos: 7 %
Hematocrit: 42.7 % (ref 34.0–46.6)
Hemoglobin: 13.8 g/dL (ref 11.1–15.9)
Immature Grans (Abs): 0 10*3/uL (ref 0.0–0.1)
Immature Granulocytes: 0 %
Lymphocytes Absolute: 1.7 10*3/uL (ref 0.7–3.1)
Lymphs: 23 %
MCH: 29.2 pg (ref 26.6–33.0)
MCHC: 32.3 g/dL (ref 31.5–35.7)
MCV: 90 fL (ref 79–97)
Monocytes Absolute: 0.6 10*3/uL (ref 0.1–0.9)
Monocytes: 9 %
Neutrophils Absolute: 4.3 10*3/uL (ref 1.4–7.0)
Neutrophils: 60 %
Platelets: 245 10*3/uL (ref 150–450)
RBC: 4.73 x10E6/uL (ref 3.77–5.28)
RDW: 14.2 % (ref 11.7–15.4)
WBC: 7.2 10*3/uL (ref 3.4–10.8)

## 2021-02-11 ENCOUNTER — Other Ambulatory Visit: Payer: Self-pay | Admitting: Infectious Diseases

## 2021-02-11 DIAGNOSIS — R918 Other nonspecific abnormal finding of lung field: Secondary | ICD-10-CM

## 2021-02-11 DIAGNOSIS — J449 Chronic obstructive pulmonary disease, unspecified: Secondary | ICD-10-CM

## 2021-02-11 DIAGNOSIS — R053 Chronic cough: Secondary | ICD-10-CM

## 2021-02-18 ENCOUNTER — Telehealth: Payer: Self-pay

## 2021-02-18 NOTE — Telephone Encounter (Signed)
Copied from [place] [phone]. Topic: General - Inquiry >> Feb 18, 2021  9:37 AM Greggory Keen D wrote: Reason for CRM: Pt would like someone to call her back regarding her office visit the first of the month.  She said she had labs done that day.  CB#  [phone]

## 2021-02-18 NOTE — Telephone Encounter (Signed)
Called pt told her about her lab results and Dr.Rumball had placed a GI referral. Pt was upset that she had to call the GI department to get her self scheduled. I told the pt that normally they will receive a call from the department they were referred to at least within a week otherwise pt has to call to get themselves scheduled at their own best day/time.

## 2021-02-23 ENCOUNTER — Other Ambulatory Visit: Payer: Self-pay

## 2021-02-23 MED ORDER — NA SULFATE-K SULFATE-MG SULF 17.5-3.13-1.6 GM/177ML PO SOLN: 1.0000 | ORAL | 0 refills | Status: DC

## 2021-02-24 ENCOUNTER — Other Ambulatory Visit: Payer: Self-pay | Admitting: Family Medicine

## 2021-02-24 DIAGNOSIS — E559 Vitamin D deficiency, unspecified: Secondary | ICD-10-CM

## 2021-02-24 NOTE — Telephone Encounter (Signed)
Requested medications are due for refill today.  unsure  Requested medications are on the active medications list.  Levothyroxine yes. Vit D no.  Last refill. Levothyroxine 11/26/2020, Vit D 06/14/2020  Future visit scheduled.   yes  Notes to clinic.  Abnormal labs for levothyroxine. And Vit D was d/c'd 02/07/2021.

## 2021-02-28 ENCOUNTER — Encounter: Payer: Self-pay | Admitting: Gastroenterology

## 2021-03-03 IMAGING — CR DG CHEST 2V
2 series · 2 of 2 positions shown · non-contrast
Comparison: 04/21/2019

CLINICAL DATA: Shortness of breath, dry cough, previous [id number]

EXAM:
CHEST - 2 VIEW

[chest pa]
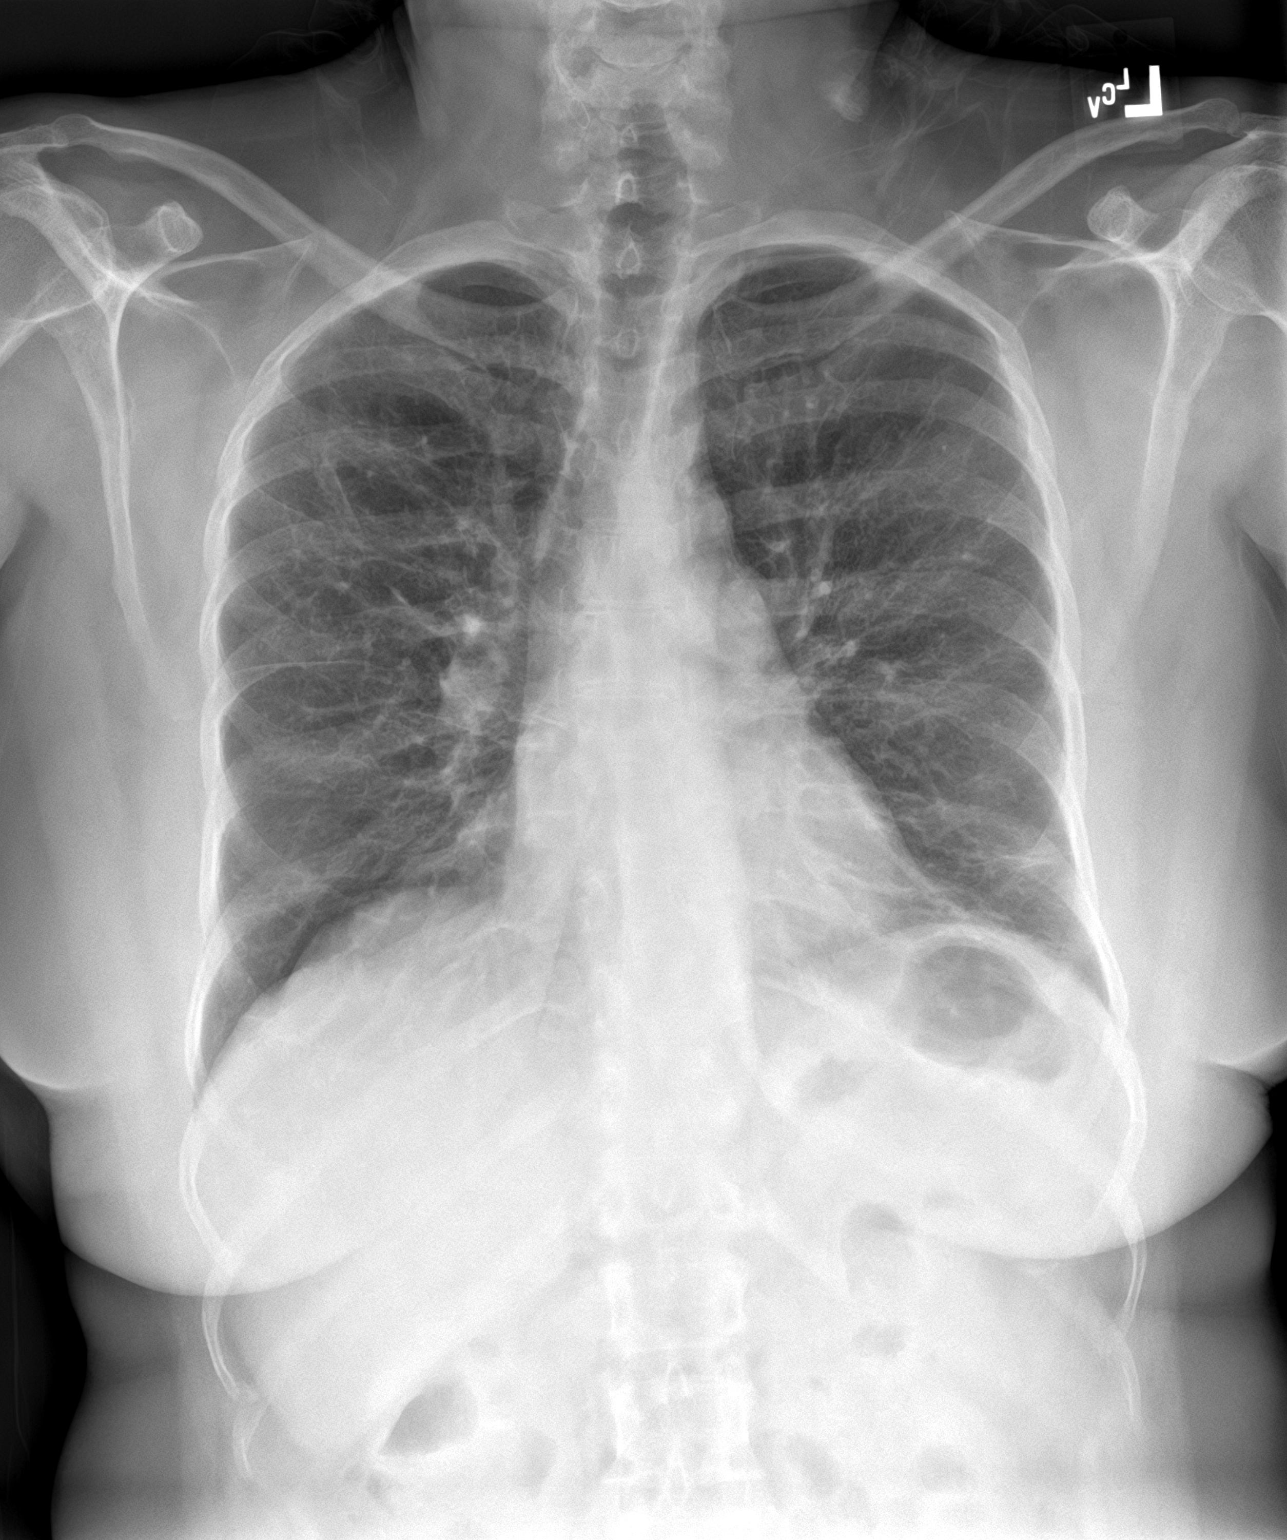

[chest lat]
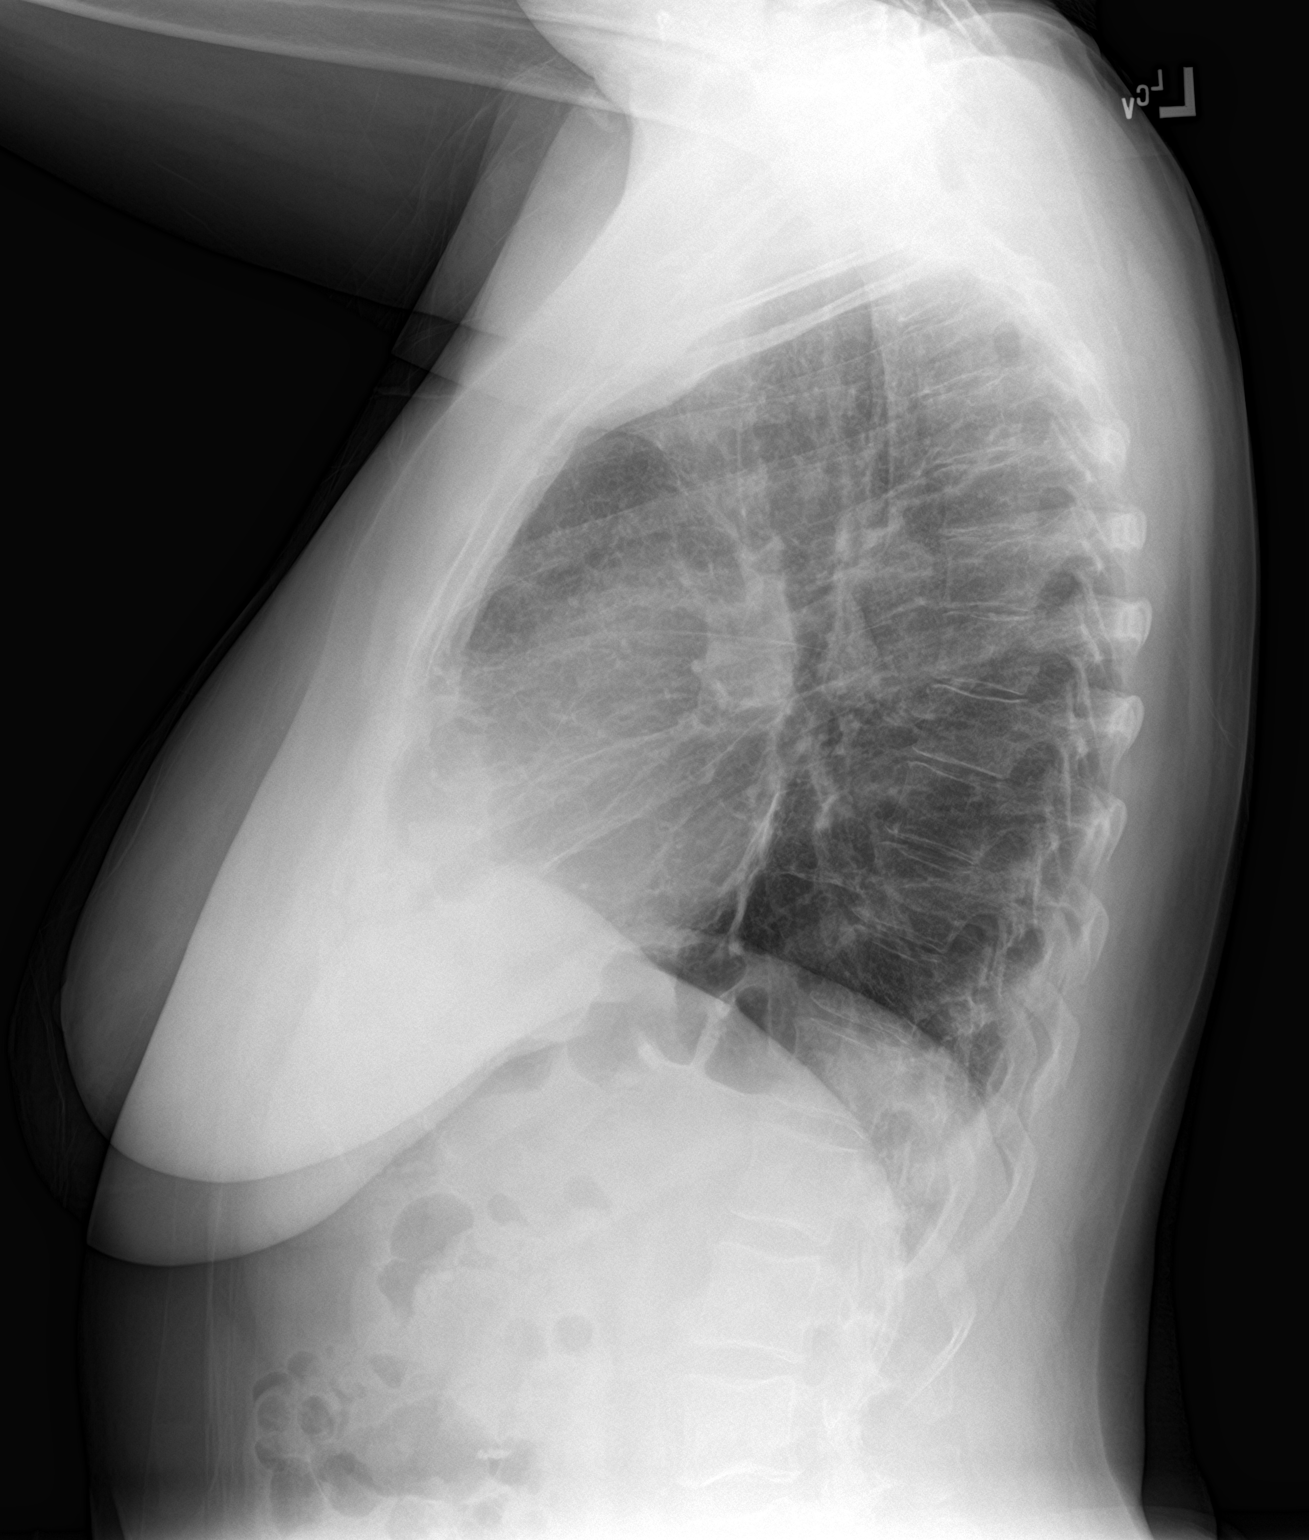

[2 of 2 positions shown; findings below may reference images not displayed]

FINDINGS: There is a stable cardiac silhouette. Airspace disease seen
previously has resolved. There is some mild scarring in the upper
lung zones. No effusion or pneumothorax. No acute bony
abnormalities.
IMPRESSION: 1. Postinflammatory scarring as above, no acute airspace disease.

## 2021-03-07 ENCOUNTER — Other Ambulatory Visit: Payer: 59

## 2021-03-11 ENCOUNTER — Other Ambulatory Visit: Payer: Self-pay

## 2021-03-11 ENCOUNTER — Ambulatory Visit: Payer: 59 | Admitting: Anesthesiology

## 2021-03-11 ENCOUNTER — Encounter
Admission: RE | Disposition: A | Discharge status: Home / Self Care | Payer: Self-pay | Source: Home / Self Care | Attending: Gastroenterology

## 2021-03-11 ENCOUNTER — Encounter: Payer: Self-pay | Admitting: Gastroenterology

## 2021-03-11 ENCOUNTER — Ambulatory Visit
Admission: RE | Admit: 2021-03-11 | Discharge: 2021-03-11 | Disposition: A | Discharge status: Home / Self Care | Payer: 59 | Attending: Gastroenterology | Admitting: Gastroenterology

## 2021-03-11 DIAGNOSIS — J449 Chronic obstructive pulmonary disease, unspecified: Secondary | ICD-10-CM | POA: Insufficient documentation

## 2021-03-11 DIAGNOSIS — K529 Noninfective gastroenteritis and colitis, unspecified: Secondary | ICD-10-CM | POA: Insufficient documentation

## 2021-03-11 DIAGNOSIS — Z860100 Personal history of colon polyps, unspecified: Secondary | ICD-10-CM

## 2021-03-11 DIAGNOSIS — Z7951 Long term (current) use of inhaled steroids: Secondary | ICD-10-CM | POA: Insufficient documentation

## 2021-03-11 DIAGNOSIS — Z1211 Encounter for screening for malignant neoplasm of colon: Secondary | ICD-10-CM | POA: Diagnosis present

## 2021-03-11 DIAGNOSIS — K6389 Other specified diseases of intestine: Secondary | ICD-10-CM | POA: Diagnosis not present

## 2021-03-11 DIAGNOSIS — K635 Polyp of colon: Secondary | ICD-10-CM | POA: Diagnosis not present

## 2021-03-11 DIAGNOSIS — Z79890 Hormone replacement therapy (postmenopausal): Secondary | ICD-10-CM | POA: Insufficient documentation

## 2021-03-11 DIAGNOSIS — D125 Benign neoplasm of sigmoid colon: Secondary | ICD-10-CM | POA: Diagnosis not present

## 2021-03-11 DIAGNOSIS — Z79899 Other long term (current) drug therapy: Secondary | ICD-10-CM | POA: Insufficient documentation

## 2021-03-11 DIAGNOSIS — Z87891 Personal history of nicotine dependence: Secondary | ICD-10-CM | POA: Diagnosis not present

## 2021-03-11 DIAGNOSIS — E039 Hypothyroidism, unspecified: Secondary | ICD-10-CM | POA: Insufficient documentation

## 2021-03-11 HISTORY — DX: Chronic obstructive pulmonary disease, unspecified: J44.9

## 2021-03-11 HISTORY — PX: COLONOSCOPY: SHX5424

## 2021-03-11 HISTORY — PX: POLYPECTOMY: SHX5525

## 2021-03-11 SURGERY — COLONOSCOPY
Anesthesia: General

## 2021-03-11 MED ORDER — ACETAMINOPHEN 325 MG PO TABS: 325.0000 mg | ORAL_TABLET | Freq: Once | ORAL | Status: DC

## 2021-03-11 MED ORDER — ACETAMINOPHEN 160 MG/5ML PO SOLN: 325.0000 mg | Freq: Once | ORAL | Status: DC

## 2021-03-11 MED ORDER — LIDOCAINE HCL (CARDIAC) PF 100 MG/5ML IV SOSY
PREFILLED_SYRINGE | INTRAVENOUS | Status: DC | PRN
  Administered 2021-03-11: 50 mg via INTRAVENOUS

## 2021-03-11 MED ORDER — PROPOFOL 10 MG/ML IV BOLUS
INTRAVENOUS | Status: DC | PRN
  Administered 2021-03-11 (×3): 20 mg via INTRAVENOUS
  Administered 2021-03-11: 100 mg via INTRAVENOUS
  Administered 2021-03-11 (×2): 20 mg via INTRAVENOUS

## 2021-03-11 MED ORDER — STERILE WATER FOR IRRIGATION IR SOLN
Status: DC | PRN
  Administered 2021-03-11: 150 mL

## 2021-03-11 MED ORDER — LACTATED RINGERS IV SOLN: INTRAVENOUS | Status: DC

## 2021-03-11 MED ORDER — SODIUM CHLORIDE 0.9 % IV SOLN: INTRAVENOUS | Status: DC

## 2021-03-11 SURGICAL SUPPLY — 22 items
CLIP HMST 235XBRD CATH ROT (MISCELLANEOUS) IMPLANT
CLIP RESOLUTION 360 11X235 (MISCELLANEOUS)
ELECT REM PT RETURN 9FT ADLT (ELECTROSURGICAL)
ELECTRODE REM PT RTRN 9FT ADLT (ELECTROSURGICAL) IMPLANT
FORCEPS BIOP RAD 4 LRG CAP 4 (CUTTING FORCEPS) ×3 IMPLANT
GOWN CVR UNV OPN BCK APRN NK (MISCELLANEOUS) ×4 IMPLANT
GOWN ISOL THUMB LOOP REG UNIV (MISCELLANEOUS) ×6
INJECTOR VARIJECT VIN23 (MISCELLANEOUS) IMPLANT
KIT DEFENDO VALVE AND CONN (KITS) IMPLANT
KIT PRC NS LF DISP ENDO (KITS) ×2 IMPLANT
KIT PROCEDURE OLYMPUS (KITS) ×3
MANIFOLD NEPTUNE II (INSTRUMENTS) ×3 IMPLANT
MARKER SPOT ENDO TATTOO 5ML (MISCELLANEOUS) IMPLANT
PROBE APC STR FIRE (PROBE) IMPLANT
RETRIEVER NET ROTH 2.5X230 LF (MISCELLANEOUS) IMPLANT
SNARE COLD EXACTO (MISCELLANEOUS) ×3 IMPLANT
SNARE SHORT THROW 13M SML OVAL (MISCELLANEOUS) IMPLANT
SNARE SNG USE RND 15MM (INSTRUMENTS) IMPLANT
SPOT EX ENDOSCOPIC TATTOO (MISCELLANEOUS)
TRAP ETRAP POLY (MISCELLANEOUS) ×3 IMPLANT
VARIJECT INJECTOR VIN23 (MISCELLANEOUS)
WATER STERILE IRR 250ML POUR (IV SOLUTION) ×3 IMPLANT

## 2021-03-11 NOTE — Transfer of Care (Signed)
Immediate Anesthesia Transfer of Care Note  Patient: Maria Richardson  Procedure(s) Performed: COLONOSCOPY POLYPECTOMY  Patient Location: PACU  Anesthesia Type: General  Level of Consciousness: awake, alert  and patient cooperative  Airway and Oxygen Therapy: Patient Spontanous Breathing and Patient connected to supplemental oxygen  Post-op Assessment: Post-op Vital signs reviewed, Patient's Cardiovascular Status Stable, Respiratory Function Stable, Patent Airway and No signs of Nausea or vomiting  Post-op Vital Signs: Reviewed and stable  Complications: No notable events documented.

## 2021-03-11 NOTE — Anesthesia Preprocedure Evaluation (Signed)
Anesthesia Evaluation  Patient identified by MRN, date of birth, ID band Patient awake    Reviewed: Allergy & Precautions, H&P , NPO status , Patient's Chart, lab work & pertinent test results  Airway Mallampati: II  TM Distance: >3 FB Neck ROM: full    Dental no notable dental hx. (+) Lower Dentures   Pulmonary asthma , COPD,  COPD inhaler, former smoker,    Pulmonary exam normal breath sounds clear to auscultation       Cardiovascular Normal cardiovascular exam Rhythm:regular Rate:Normal     Neuro/Psych    GI/Hepatic   Endo/Other  Hypothyroidism   Renal/GU      Musculoskeletal   Abdominal   Peds  Hematology   Anesthesia Other Findings   Reproductive/Obstetrics                             Anesthesia Physical Anesthesia Plan  ASA: 2  Anesthesia Plan: General   Post-op Pain Management: Minimal or no pain anticipated   Induction: Intravenous  PONV Risk Score and Plan: 3 and Treatment may vary due to age or medical condition, TIVA and Propofol infusion  Airway Management Planned: Natural Airway  Additional Equipment:   Intra-op Plan:   Post-operative Plan:   Informed Consent: I have reviewed the patients History and Physical, chart, labs and discussed the procedure including the risks, benefits and alternatives for the proposed anesthesia with the patient or authorized representative who has indicated his/her understanding and acceptance.     Dental Advisory Given  Plan Discussed with: CRNA  Anesthesia Plan Comments:         Anesthesia Quick Evaluation

## 2021-03-11 NOTE — Anesthesia Postprocedure Evaluation (Signed)
Anesthesia Post Note  Patient: Maria Richardson  Procedure(s) Performed: COLONOSCOPY POLYPECTOMY     Patient location during evaluation: PACU Anesthesia Type: General Level of consciousness: awake and alert and oriented Pain management: satisfactory to patient Vital Signs Assessment: post-procedure vital signs reviewed and stable Respiratory status: spontaneous breathing, nonlabored ventilation and respiratory function stable Cardiovascular status: blood pressure returned to baseline and stable Postop Assessment: Adequate PO intake and No signs of nausea or vomiting Anesthetic complications: no   No notable events documented.  Raliegh Ip

## 2021-03-11 NOTE — H&P (Signed)
Lucilla Lame, MD [facility] [address], [place], [place] [address] Phone:[phone] Fax : [phone]  Primary Care Physician:  Steele Sizer, MD Primary Gastroenterologist:  Dr. Allen Norris  Pre-Procedure History & Physical: HPI:  Maria Richardson is a 59 y.o. female is here for an colonoscopy.   Past Medical History:  Diagnosis Date   Asthma    COPD (chronic obstructive pulmonary disease) (HCC)    Eczema    Hypoglycemia    Hypothyroidism    Shortness of breath dyspnea    because of asthma   Wears dentures    full lower    Past Surgical History:  Procedure Laterality Date   APPENDECTOMY     COLONOSCOPY WITH PROPOFOL N/A 07/23/2015   Procedure: COLONOSCOPY WITH PROPOFOL;  Surgeon: Lucilla Lame, MD;  Location: [place];  Service: Endoscopy;  Laterality: N/A;  PLEASE LEAVE PT AT LATER AM    OOPHORECTOMY     as teenager   POLYPECTOMY  07/23/2015   Procedure: POLYPECTOMY;  Surgeon: Lucilla Lame, MD;  Location: [place];  Service: Endoscopy;;    Prior to Admission medications   Medication Sig Start Date End Date Taking? Authorizing Provider  ascorbic acid (VITAMIN C) 500 MG tablet Take 1 tablet (500 mg total) by mouth daily. 04/25/19  Yes Dhungel, Nishant, MD  Fluticasone-Umeclidin-Vilant (TRELEGY ELLIPTA) 100-62.5-25 MCG/INH AEPB Take 1 puff by mouth daily. 06/30/20  Yes Sowles, Drue Stager, MD  levothyroxine (SYNTHROID) 150 MCG tablet TAKE 1 TABLET(150 MCG) BY MOUTH DAILY 11/26/20  Yes Sowles, Drue Stager, MD  Na Sulfate-K Sulfate-Mg Sulf (SUPREP BOWEL PREP KIT) 17.5-3.13-1.6 GM/177ML SOLN Take 1 kit by mouth as directed. 02/23/21  Yes Lucilla Lame, MD  predniSONE (DELTASONE) 10 MG tablet Take 10 mg by mouth daily. 01/19/21   [provider]  triamcinolone ointment (KENALOG) 0.5 % Apply 1 application topically 2 (two) times daily. 06/30/20   Steele Sizer, MD    Allergies as of 02/22/2021 - Review Complete 02/07/2021  Allergen Reaction Noted    Codeine Nausea And Vomiting 07/15/2014    Family History  Problem Relation Age of Onset   Diabetes Father    Hypertension Father    Kidney disease Father    Colon cancer Father     Social History   Socioeconomic History   Marital status: Married    Spouse name: Richard    Number of children: 0   Years of education: Not on file   Highest education level: 12th grade  Occupational History   Occupation: Hotel manager: [employer]  Tobacco Use   Smoking status: Former    Packs/day: 0.50    Years: 30.00    Pack years: 15.00    Types: Cigarettes    Quit date: 04/27/1998    Years since quitting: 22.8   Smokeless tobacco: Never  Vaping Use   Vaping Use: Never used  Substance and Sexual Activity   Alcohol use: No    Alcohol/week: 0.0 standard drinks   Drug use: No   Sexual activity: Not Currently    Comment: no desire  Other Topics Concern   Not on file  Social History Narrative   Husband has a history of alcohol use and sometimes drug use and she gets scared of getting exposed to STI and sometimes she is scared when he is under the influence.    Never had children - but has step children and step grandchildren    Raised her cousin, still lives at  home   Social Determinants of Health   Financial Resource Strain: Not on file  Food Insecurity: Not on file  Transportation Needs: Not on file  Physical Activity: Not on file  Stress: Not on file  Social Connections: Not on file  Intimate Partner Violence: Not on file    Review of Systems: See HPI, otherwise negative ROS  Physical Exam: BP 109/73   Pulse 74   Temp 98.3 F (36.8 C) (Temporal)   Resp 20   Wt 68 kg   SpO2 95%   BMI 29.29 kg/m  General:   Alert,  pleasant and cooperative in NAD Head:  Normocephalic and atraumatic. Neck:  Supple; no masses or thyromegaly. Lungs:  Clear throughout to auscultation.    Heart:  Regular rate and rhythm. Abdomen:  Soft, nontender and nondistended. Normal bowel  sounds, without guarding, and without rebound.   Neurologic:  Alert and  oriented x4;  grossly normal neurologically.  Impression/Plan: Maria Richardson is here for an colonoscopy to be performed for a history of adenomatous polyps on 2017   Risks, benefits, limitations, and alternatives regarding  colonoscopy have been reviewed with the patient.  Questions have been answered.  All parties agreeable.   Lucilla Lame, MD  03/11/2021, 8:11 AM

## 2021-03-11 NOTE — Op Note (Signed)
[facility] Patient Name: Maria Richardson Procedure Date: 03/11/2021 8:17 AM MRN: [id number] Account #: [id number] Date of Birth: [birth year] Admit Type: Outpatient Age: 60 Room: [facility] OR ROOM 01 Gender: Female Note Status: Finalized Instrument Name: [id number] Procedure:             Colonoscopy Indications:           High risk colon cancer surveillance: Personal history                         of colonic polyps Providers:             Lucilla Lame MD, MD Referring MD:          Bethena Roys. Sowles, MD (Referring MD) Medicines:             Propofol per Anesthesia Complications:         No immediate complications. Procedure:             Pre-Anesthesia Assessment:                        - Prior to the procedure, a History and Physical was                         performed, and patient medications and allergies were                         reviewed. The patient's tolerance of previous                         anesthesia was also reviewed. The risks and benefits                         of the procedure and the sedation options and risks                         were discussed with the patient. All questions were                         answered, and informed consent was obtained. Prior                         Anticoagulants: The patient has taken no previous                         anticoagulant or antiplatelet agents. ASA Grade                         Assessment: II - A patient with mild systemic disease.                         After reviewing the risks and benefits, the patient                         was deemed in satisfactory condition to undergo the                         procedure.  After obtaining informed consent, the colonoscope was                         passed under direct vision. Throughout the procedure,                         the patient's blood pressure, pulse, and oxygen                         saturations were monitored  continuously. The                         Colonoscope was introduced through the anus and                         advanced to the the cecum, identified by appendiceal                         orifice and ileocecal valve. The colonoscopy was                         performed without difficulty. The patient tolerated                         the procedure well. The quality of the bowel                         preparation was excellent. Findings:      The perianal and digital rectal examinations were normal.      A 5 mm polyp was found in the ascending colon. The polyp was sessile.       The polyp was removed with a cold snare. Resection and retrieval were       complete.      A 4 mm polyp was found in the sigmoid colon. The polyp was sessile. The       polyp was removed with a cold biopsy forceps. Resection and retrieval       were complete.      Segmental moderate inflammation characterized by erythema was found in       the sigmoid colon. Biopsies were taken with a cold forceps for histology. Impression:            - One 5 mm polyp in the ascending colon, removed with                         a cold snare. Resected and retrieved.                        - One 4 mm polyp in the sigmoid colon, removed with a                         cold biopsy forceps. Resected and retrieved.                        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  Biopsied. Recommendation:        - Discharge patient to home.                        - Resume previous diet.                        - Continue present medications.                        - Await pathology results.                        - Repeat colonoscopy in 7 years for surveillance. Procedure Code(s):     --- Professional ---                        [id number], Colonoscopy, flexible; with removal of                         tumor(s), polyp(s), or other  lesion(s) by snare                         technique                        45380, 80, Colonoscopy, flexible; with biopsy, single                         or multiple Diagnosis Code(s):     --- Professional ---                        Z86.010, Personal history of colonic polyps                        K63.5, Polyp of colon                        K63.89, Other specified diseases of intestine CPT copyright 2019 American Medical Association. All rights reserved. The codes documented in this report are preliminary and upon coder review may  be revised to meet current compliance requirements. Lucilla Lame MD, MD 03/11/2021 8:42:59 AM This report has been signed electronically. Number of Addenda: 0 Note Initiated On: 03/11/2021 8:17 AM Scope Withdrawal Time: 0 hours 9 minutes 40 seconds  Total Procedure Duration: 0 hours 13 minutes 1 second  Estimated Blood Loss:  Estimated blood loss: none.      [facility]

## 2021-03-11 NOTE — Anesthesia Procedure Notes (Signed)
Date/Time: 03/11/2021 8:24 AM Performed by: Cameron Ali, CRNA Pre-anesthesia Checklist: Patient identified, Emergency Drugs available, Suction available, Timeout performed and Patient being monitored Patient Re-evaluated:Patient Re-evaluated prior to induction Oxygen Delivery Method: Nasal cannula Placement Confirmation: positive ETCO2

## 2021-03-14 ENCOUNTER — Encounter: Payer: Self-pay | Admitting: Gastroenterology

## 2021-03-14 LAB — SURGICAL PATHOLOGY

## 2021-03-15 ENCOUNTER — Telehealth: Payer: Self-pay

## 2021-03-15 ENCOUNTER — Other Ambulatory Visit: Payer: Self-pay

## 2021-03-15 DIAGNOSIS — K529 Noninfective gastroenteritis and colitis, unspecified: Secondary | ICD-10-CM

## 2021-03-15 NOTE — Telephone Encounter (Signed)
Pt notified of colonoscopy results. Pt will stop by Friday to pick up stool containers.

## 2021-03-15 NOTE — Telephone Encounter (Signed)
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

## 2021-03-21 ENCOUNTER — Telehealth: Payer: Self-pay | Admitting: Gastroenterology

## 2021-03-21 NOTE — Telephone Encounter (Signed)
Inbound call from pt requesting a call back stating that she needs instructions for her stool container. Please advise. Thank you.

## 2021-03-22 NOTE — Telephone Encounter (Signed)
Called patient back and explained what she is needing to do to collect her stool sample. Patient had no further questions.

## 2021-03-22 NOTE — Telephone Encounter (Signed)
Please see note below. 

## 2021-04-06 LAB — GI PROFILE, STOOL, PCR

## 2021-04-09 LAB — C DIFFICILE, CYTOTOXIN B

## 2021-04-09 LAB — C DIFFICILE TOXINS A+B W/RFLX: C difficile Toxins A+B, EIA: NEGATIVE

## 2021-04-11 ENCOUNTER — Ambulatory Visit: Payer: 59

## 2021-04-19 ENCOUNTER — Ambulatory Visit: Payer: 59

## 2021-04-21 ENCOUNTER — Other Ambulatory Visit: Payer: Self-pay

## 2021-04-21 ENCOUNTER — Ambulatory Visit
Admission: RE | Admit: 2021-04-21 | Discharge: 2021-04-21 | Disposition: A | Discharge status: Home / Self Care | Payer: 59 | Source: Ambulatory Visit | Attending: Infectious Diseases | Admitting: Infectious Diseases

## 2021-04-21 DIAGNOSIS — R053 Chronic cough: Secondary | ICD-10-CM

## 2021-04-21 DIAGNOSIS — R918 Other nonspecific abnormal finding of lung field: Secondary | ICD-10-CM | POA: Diagnosis present

## 2021-04-21 DIAGNOSIS — J449 Chronic obstructive pulmonary disease, unspecified: Secondary | ICD-10-CM | POA: Diagnosis not present

## 2021-05-17 ENCOUNTER — Encounter: Payer: 59 | Admitting: Family Medicine

## 2021-11-02 ENCOUNTER — Other Ambulatory Visit: Payer: Self-pay | Admitting: Family Medicine

## 2021-11-02 ENCOUNTER — Telehealth: Payer: Self-pay | Admitting: Family Medicine

## 2021-11-02 DIAGNOSIS — E559 Vitamin D deficiency, unspecified: Secondary | ICD-10-CM

## 2021-11-02 DIAGNOSIS — Z79899 Other long term (current) drug therapy: Secondary | ICD-10-CM

## 2021-11-02 DIAGNOSIS — E785 Hyperlipidemia, unspecified: Secondary | ICD-10-CM

## 2021-11-02 DIAGNOSIS — E039 Hypothyroidism, unspecified: Secondary | ICD-10-CM

## 2021-11-02 DIAGNOSIS — R7303 Prediabetes: Secondary | ICD-10-CM

## 2021-11-02 NOTE — Telephone Encounter (Signed)
Patient notified and has  enough  (8) till her apt on Nov 11, 2021

## 2021-11-02 NOTE — Telephone Encounter (Signed)
Patient hasn't been seen since Nov, and cancelled Feb appt. Please schedule a follow up, Thanks!

## 2021-11-02 NOTE — Telephone Encounter (Signed)
Lvm for pt to call and schedule an appt  

## 2021-11-10 ENCOUNTER — Other Ambulatory Visit: Payer: Self-pay

## 2021-11-10 DIAGNOSIS — Z79899 Other long term (current) drug therapy: Secondary | ICD-10-CM

## 2021-11-10 DIAGNOSIS — E039 Hypothyroidism, unspecified: Secondary | ICD-10-CM

## 2021-11-10 DIAGNOSIS — E785 Hyperlipidemia, unspecified: Secondary | ICD-10-CM

## 2021-11-10 DIAGNOSIS — R7303 Prediabetes: Secondary | ICD-10-CM

## 2021-11-10 DIAGNOSIS — E559 Vitamin D deficiency, unspecified: Secondary | ICD-10-CM

## 2021-11-10 NOTE — Progress Notes (Signed)
Name: Maria Richardson   MRN: [id number]    DOB: 12/24/1960   Date:11/11/2021       Progress Note  Subjective  Chief Complaint  Follow Up  HPI  Adult hypothyroidism: she has been taking levothyroxine 150 mcg daily but only over the past two weeks, TSH is at goal, advised to continue current dose and return in 1 month to recheck level. She denies hair loss, no change in bowel movements, denies constipation    Pre-diabetes: father had a history of  DM, her hgbA1C was up to 6.5 %  back in 08/2010 but is down to 5.9 % today , she is avoiding sweets, but has fatigue about 30 minutes after a meal, she is not sure if it happens after a low carb diet. I will give her a list of low carb diet to see if symptoms are not present on a very low carb diet    COPD/Asthma/hiatal hernia : under the care of Dr. Jaynie Crumble, she used to smoke 25 years ago, she had worsening of cough last Fall and CT chest was done 04/2021, that showed hiatal hernia, emphysema and was given a PPI, she has been  taking Trelegy daily for a while and seems to help , she just started taking Nexium  40 mg two weeks ago and a very small decrease in cough since     Vulva lesion: she saw Dr. 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 but she states no longer having pruritis , she was advised to go back in 3 months but she lost to follow up, advised her to contact [place] [facility] and schedule a follow up  Eczema: she has sensitivity to metals and usually has oubreaks around her neck due to necklace and needs refill of topical medication   Atherosclerosis of aorta: discussed starting her on statin therapy   Patient Active Problem List   Diagnosis Date Noted   Personal history of colonic polyps    Polyp of ascending colon    Other specified diseases of intestine    Cervical radiculitis 11/11/2019   Pulmonary emphysema ([place])    History of COVID-19 04/2019   Dyslipidemia 08/04/2015   Benign neoplasm of sigmoid  colon    Seasonal allergic rhinitis 40/98/1191   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   History of anemia 01/07/2015   Excess weight 01/07/2015   Leiomyoma of uterus 01/07/2015   Calculus of gallbladder 01/07/2015   Hypothyroidism 09/24/2009   COPD with asthma ([place]) 05/18/2009   Vitamin D deficiency 05/18/2009    Past Surgical History:  Procedure Laterality Date   APPENDECTOMY     COLONOSCOPY N/A 03/11/2021   Procedure: COLONOSCOPY;  Surgeon: Lucilla Lame, MD;  Location: [place];  Service: Endoscopy;  Laterality: N/A;   COLONOSCOPY WITH PROPOFOL N/A 07/23/2015   Procedure: COLONOSCOPY WITH PROPOFOL;  Surgeon: Lucilla Lame, MD;  Location: [place];  Service: Endoscopy;  Laterality: N/A;  PLEASE LEAVE PT AT LATER AM    OOPHORECTOMY     as teenager   POLYPECTOMY  07/23/2015   Procedure: POLYPECTOMY;  Surgeon: Lucilla Lame, MD;  Location: [facility];  Service: Endoscopy;;   POLYPECTOMY  03/11/2021   Procedure: POLYPECTOMY;  Surgeon: Lucilla Lame, MD;  Location: [place];  Service: Endoscopy;;    Family History  Problem Relation Age of Onset   Diabetes Father    Hypertension Father    Kidney disease Father    Colon cancer Father  Social History   Tobacco Use   Smoking status: Former    Packs/day: 0.50    Years: 30.00    Total pack years: 15.00    Types: Cigarettes    Quit date: 04/27/1998    Years since quitting: 23.5   Smokeless tobacco: Never  Substance Use Topics   Alcohol use: No    Alcohol/week: 0.0 standard drinks of alcohol     Current Outpatient Medications:    esomeprazole (NEXIUM) 40 MG capsule, Take 40 mg by mouth daily., Disp: , Rfl:    Fluticasone-Umeclidin-Vilant (TRELEGY ELLIPTA) 100-62.5-25 MCG/INH AEPB, Take 1 puff by mouth daily., Disp: 180 each, Rfl: 1   levothyroxine (SYNTHROID) 150 MCG tablet, TAKE 1 TABLET(150 MCG) BY MOUTH DAILY, Disp: 90 tablet, Rfl: 0   triamcinolone ointment (KENALOG) 0.5 %, Apply 1 application  topically 2 (two) times daily., Disp: 60 g, Rfl: 0  Allergies  Allergen Reactions   Codeine Nausea And Vomiting    I personally reviewed active problem list, medication list, allergies, family history, social history, health maintenance with the patient/caregiver today.   ROS  Constitutional: Negative for fever or significant  weight change.  Respiratory: positive for cough but no  shortness of breath.   Cardiovascular: Negative for chest pain or palpitations.  Gastrointestinal: Negative for abdominal pain, no bowel changes.  Musculoskeletal: Negative for gait problem or joint swelling.  Skin: Negative for rash.  Neurological: Negative for dizziness or headache.  No other specific complaints in a complete review of systems (except as listed in HPI above).   Objective  Vitals:   11/11/21 1545  BP: 122/68  Pulse: 85  Resp: 16  Temp: 97.8 F (36.6 C)  TempSrc: Oral  SpO2: 93%  Weight: 149 lb (67.6 kg)  Height: 5' (1.524 m)    Body mass index is 29.1 kg/m.  Physical Exam  Constitutional: Patient appears well-developed and well-nourished.  No distress.  HEENT: head atraumatic, normocephalic, pupils equal and reactive to light, neck supple Cardiovascular: Normal rate, regular rhythm and normal heart sounds.  No murmur heard. No BLE edema. Pulmonary/Chest: Effort normal and breath sounds normal. No respiratory distress. Abdominal: Soft.  There is no tenderness. Psychiatric: Patient has a normal mood and affect. behavior is normal. Judgment and thought content normal.   Recent Results (from the past 2160 hour(s))  Lipid Profile     Status: Abnormal   Collection Time: 11/10/21  1:06 PM  Result Value Ref Range   Cholesterol, Total 177 100 - 199 mg/dL   Triglycerides 79 0 - 149 mg/dL   HDL 52 >39 mg/dL   VLDL Cholesterol Cal 15 5 - 40 mg/dL   LDL Chol Calc (NIH) 110 (H) 0 - 99 mg/dL   Chol/HDL Ratio 3.4 0.0 - 4.4 ratio    Comment:                                   T.  Chol/HDL Ratio                                             Men  Women                               1/2 Avg.Risk  3.4  3.3                                   Avg.Risk  5.0    4.4                                2X Avg.Risk  9.6    7.1                                3X Avg.Risk 23.4   11.0   CBC with Differential/Platelet     Status: None   Collection Time: 11/10/21  1:06 PM  Result Value Ref Range   WBC 8.3 3.4 - 10.8 x10E3/uL   RBC 4.92 3.77 - 5.28 x10E6/uL   Hemoglobin 14.1 11.1 - 15.9 g/dL   Hematocrit 44.2 34.0 - 46.6 %   MCV 90 79 - 97 fL   MCH 28.7 26.6 - 33.0 pg   MCHC 31.9 31.5 - 35.7 g/dL   RDW 14.0 11.7 - 15.4 %   Platelets 261 150 - 450 x10E3/uL   Neutrophils 62 Not Estab. %   Lymphs 27 Not Estab. %   Monocytes 6 Not Estab. %   Eos 3 Not Estab. %   Basos 1 Not Estab. %   Neutrophils Absolute 5.3 1.4 - 7.0 x10E3/uL   Lymphocytes Absolute 2.2 0.7 - 3.1 x10E3/uL   Monocytes Absolute 0.5 0.1 - 0.9 x10E3/uL   EOS (ABSOLUTE) 0.2 0.0 - 0.4 x10E3/uL   Basophils Absolute 0.1 0.0 - 0.2 x10E3/uL   Immature Granulocytes 1 Not Estab. %   Immature Grans (Abs) 0.1 0.0 - 0.1 x10E3/uL  Comprehensive metabolic panel     Status: Abnormal   Collection Time: 11/10/21  1:06 PM  Result Value Ref Range   Glucose 91 70 - 99 mg/dL   BUN 14 8 - 27 mg/dL   Creatinine, Ser 1.06 (H) 0.57 - 1.00 mg/dL   eGFR 60 >59 mL/min/1.73   BUN/Creatinine Ratio 13 12 - 28   Sodium 136 134 - 144 mmol/L   Potassium 4.5 3.5 - 5.2 mmol/L   Chloride 98 96 - 106 mmol/L   CO2 23 20 - 29 mmol/L   Calcium 9.8 8.7 - 10.3 mg/dL   Total Protein 7.3 6.0 - 8.5 g/dL   Albumin 4.2 3.8 - 4.9 g/dL   Globulin, Total 3.1 1.5 - 4.5 g/dL   Albumin/Globulin Ratio 1.4 1.2 - 2.2   Bilirubin Total 0.3 0.0 - 1.2 mg/dL   Alkaline Phosphatase 120 44 - 121 IU/L   AST 41 (H) 0 - 40 IU/L   ALT 45 (H) 0 - 32 IU/L  HgB A1c     Status: Abnormal   Collection Time: 11/10/21  1:06 PM  Result Value Ref Range   Hgb A1c MFr Bld 5.9  (H) 4.8 - 5.6 %    Comment:          Prediabetes: 5.7 - 6.4          Diabetes: >6.4          Glycemic control for adults with diabetes: <7.0    Est. average glucose Bld gHb Est-mCnc 123 mg/dL  TSH     Status: None   Collection Time: 11/10/21  1:06 PM  Result Value Ref Range   TSH 3.690  0.450 - 4.500 uIU/mL  Vitamin D (25 hydroxy)     Status: Abnormal   Collection Time: 11/10/21  1:06 PM  Result Value Ref Range   Vit D, 25-Hydroxy 25.3 (L) 30.0 - 100.0 ng/mL    Comment: Vitamin D deficiency has been defined by the [place] practice guideline as a level of serum 25-OH vitamin D less than 20 ng/mL (1,2). The Endocrine Society went on to further define vitamin D insufficiency as a level between 21 and 29 ng/mL (2). 1. IOM (Institute of Medicine). 2010. Dietary reference    intakes for calcium and D. [place]: The    [employer]. 2. Holick MF, Binkley [place], Bischoff-Ferrari HA, et al.    Evaluation, treatment, and prevention of vitamin D    deficiency: an Endocrine Society clinical practice    guideline. JCEM. 2011 Jul; 96(7):1911-30.      PHQ2/9:    11/11/2021    4:11 PM 11/11/2021    4:01 PM 02/07/2021   10:31 AM 09/27/2020    1:49 PM 06/30/2020    3:50 PM  Depression screen PHQ 2/9  Decreased Interest 0 0 0 0 0  Down, Depressed, Hopeless 0 0 0 0 0  PHQ - 2 Score 0 0 0 0 0  Altered sleeping 0 0 0    Tired, decreased energy 0 0 0    Change in appetite 0 0 0    Feeling bad or failure about yourself  0 0 0    Trouble concentrating 0 0 0    Moving slowly or fidgety/restless 0 0 0    Suicidal thoughts 0 0 0    PHQ-9 Score 0 0 0    Difficult doing work/chores Not difficult at all  Not difficult at all      phq 9 is negative   Fall Risk:    11/11/2021    4:01 PM 02/07/2021   10:31 AM 09/27/2020    1:48 PM 06/30/2020    3:49 PM 01/15/2020    8:30 AM  Fall Risk   Falls in the past year? 0 0 0 0 0  Number falls in past  yr:  0 0 0 0  Injury with Fall?  0 0 0 0  Risk for fall due to : No Fall Risks    No Fall Risks  Follow up Falls prevention discussed  Falls evaluation completed  Falls evaluation completed;Falls prevention discussed;Education provided      Functional Status Survey: Is the patient deaf or have difficulty hearing?: No Does the patient have difficulty seeing, even when wearing glasses/contacts?: No Does the patient have difficulty concentrating, remembering, or making decisions?: No Does the patient have difficulty walking or climbing stairs?: No Does the patient have difficulty dressing or bathing?: No Does the patient have difficulty doing errands alone such as visiting a doctor's office or shopping?: No    Assessment & Plan  1. Atherosclerosis of aorta ([place])  She refused statin therapy at this time  2. COPD with asthma ([place])   3. Calculus of gallbladder without cholecystitis without obstruction  Discussed symptoms of acute cholecystitis and when to go to EC  4. Adult hypothyroidism  - TSH  5. Pre-diabetes  Reviewed last labs   6. Dyslipidemia   7. Vitamin D deficiency   8. Other emphysema ([place])   9. Breast cancer screening by mammogram  - MM 3D SCREEN BREAST BILATERAL; Future

## 2021-11-11 ENCOUNTER — Ambulatory Visit: Payer: 59 | Admitting: Family Medicine

## 2021-11-11 ENCOUNTER — Encounter: Payer: Self-pay | Admitting: Family Medicine

## 2021-11-11 VITALS — BP 122/68 | HR 85 | Temp 97.8°F | Resp 16 | Ht 60.0 in | Wt 149.0 lb

## 2021-11-11 DIAGNOSIS — E785 Hyperlipidemia, unspecified: Secondary | ICD-10-CM

## 2021-11-11 DIAGNOSIS — E039 Hypothyroidism, unspecified: Secondary | ICD-10-CM

## 2021-11-11 DIAGNOSIS — J449 Chronic obstructive pulmonary disease, unspecified: Secondary | ICD-10-CM | POA: Diagnosis not present

## 2021-11-11 DIAGNOSIS — I700 Atherosclerosis of aorta: Secondary | ICD-10-CM | POA: Diagnosis not present

## 2021-11-11 DIAGNOSIS — K8020 Calculus of gallbladder without cholecystitis without obstruction: Secondary | ICD-10-CM

## 2021-11-11 DIAGNOSIS — R7303 Prediabetes: Secondary | ICD-10-CM

## 2021-11-11 DIAGNOSIS — Z1231 Encounter for screening mammogram for malignant neoplasm of breast: Secondary | ICD-10-CM

## 2021-11-11 DIAGNOSIS — E559 Vitamin D deficiency, unspecified: Secondary | ICD-10-CM

## 2021-11-11 DIAGNOSIS — J438 Other emphysema: Secondary | ICD-10-CM

## 2021-11-11 LAB — CBC WITH DIFFERENTIAL/PLATELET
Basophils Absolute: 0.1 10*3/uL (ref 0.0–0.2)
Basos: 1 %
EOS (ABSOLUTE): 0.2 10*3/uL (ref 0.0–0.4)
Eos: 3 %
Hematocrit: 44.2 % (ref 34.0–46.6)
Hemoglobin: 14.1 g/dL (ref 11.1–15.9)
Immature Grans (Abs): 0.1 10*3/uL (ref 0.0–0.1)
Immature Granulocytes: 1 %
Lymphocytes Absolute: 2.2 10*3/uL (ref 0.7–3.1)
Lymphs: 27 %
MCH: 28.7 pg (ref 26.6–33.0)
MCHC: 31.9 g/dL (ref 31.5–35.7)
MCV: 90 fL (ref 79–97)
Monocytes Absolute: 0.5 10*3/uL (ref 0.1–0.9)
Monocytes: 6 %
Neutrophils Absolute: 5.3 10*3/uL (ref 1.4–7.0)
Neutrophils: 62 %
Platelets: 261 10*3/uL (ref 150–450)
RBC: 4.92 x10E6/uL (ref 3.77–5.28)
RDW: 14 % (ref 11.7–15.4)
WBC: 8.3 10*3/uL (ref 3.4–10.8)

## 2021-11-11 LAB — HEMOGLOBIN A1C
Est. average glucose Bld gHb Est-mCnc: 123 mg/dL
Hgb A1c MFr Bld: 5.9 % — ABNORMAL HIGH (ref 4.8–5.6)

## 2021-11-11 LAB — LIPID PANEL
Chol/HDL Ratio: 3.4 ratio (ref 0.0–4.4)
Cholesterol, Total: 177 mg/dL (ref 100–199)
HDL: 52 mg/dL (ref >39)
LDL Chol Calc (NIH): 110 mg/dL — ABNORMAL HIGH (ref 0–99)
Triglycerides: 79 mg/dL (ref 0–149)
VLDL Cholesterol Cal: 15 mg/dL (ref 5–40)

## 2021-11-11 LAB — VITAMIN D 25 HYDROXY (VIT D DEFICIENCY, FRACTURES): Vit D, 25-Hydroxy: 25.3 ng/mL — ABNORMAL LOW (ref 30.0–100.0)

## 2021-11-11 LAB — COMPREHENSIVE METABOLIC PANEL
ALT: 45 IU/L — ABNORMAL HIGH (ref 0–32)
AST: 41 IU/L — ABNORMAL HIGH (ref 0–40)
Albumin/Globulin Ratio: 1.4 (ref 1.2–2.2)
Albumin: 4.2 g/dL (ref 3.8–4.9)
Alkaline Phosphatase: 120 IU/L (ref 44–121)
BUN/Creatinine Ratio: 13 (ref 12–28)
BUN: 14 mg/dL (ref 8–27)
Bilirubin Total: 0.3 mg/dL (ref 0.0–1.2)
CO2: 23 mmol/L (ref 20–29)
Calcium: 9.8 mg/dL (ref 8.7–10.3)
Chloride: 98 mmol/L (ref 96–106)
Creatinine, Ser: 1.06 mg/dL — ABNORMAL HIGH (ref 0.57–1.00)
Globulin, Total: 3.1 g/dL (ref 1.5–4.5)
Glucose: 91 mg/dL (ref 70–99)
Potassium: 4.5 mmol/L (ref 3.5–5.2)
Sodium: 136 mmol/L (ref 134–144)
Total Protein: 7.3 g/dL (ref 6.0–8.5)
eGFR: 60 mL/min/{1.73_m2} (ref >59)

## 2021-11-11 LAB — TSH: TSH: 3.69 u[IU]/mL (ref 0.450–4.500)

## 2021-11-11 NOTE — Patient Instructions (Signed)
[facility]

## 2021-11-14 ENCOUNTER — Other Ambulatory Visit: Payer: Self-pay | Admitting: Family Medicine

## 2021-11-14 ENCOUNTER — Other Ambulatory Visit: Payer: Self-pay

## 2021-11-14 MED ORDER — LEVOTHYROXINE SODIUM 150 MCG PO TABS
ORAL_TABLET | ORAL | 0 refills | Status: DC
Start: 2021-11-14 — End: 2022-02-08

## 2021-11-14 NOTE — Telephone Encounter (Signed)
Patient checking status of refill request  Patient's last ov was 11-11-2021  Please assist further

## 2021-12-13 ENCOUNTER — Ambulatory Visit
Admission: RE | Admit: 2021-12-13 | Discharge: 2021-12-13 | Disposition: A | Discharge status: Home / Self Care | Payer: 59 | Source: Ambulatory Visit | Attending: Family Medicine | Admitting: Family Medicine

## 2021-12-13 DIAGNOSIS — Z1231 Encounter for screening mammogram for malignant neoplasm of breast: Secondary | ICD-10-CM | POA: Diagnosis present

## 2021-12-15 ENCOUNTER — Other Ambulatory Visit: Payer: Self-pay | Admitting: Family Medicine

## 2021-12-15 DIAGNOSIS — N63 Unspecified lump in unspecified breast: Secondary | ICD-10-CM

## 2021-12-15 DIAGNOSIS — R928 Other abnormal and inconclusive findings on diagnostic imaging of breast: Secondary | ICD-10-CM

## 2021-12-30 ENCOUNTER — Ambulatory Visit
Admission: RE | Admit: 2021-12-30 | Discharge: 2021-12-30 | Disposition: A | Discharge status: Home / Self Care | Payer: 59 | Source: Ambulatory Visit | Attending: Family Medicine | Admitting: Family Medicine

## 2021-12-30 ENCOUNTER — Other Ambulatory Visit: Payer: Self-pay | Admitting: Family Medicine

## 2021-12-30 DIAGNOSIS — N63 Unspecified lump in unspecified breast: Secondary | ICD-10-CM | POA: Diagnosis present

## 2021-12-30 DIAGNOSIS — R928 Other abnormal and inconclusive findings on diagnostic imaging of breast: Secondary | ICD-10-CM

## 2022-01-02 ENCOUNTER — Other Ambulatory Visit: Payer: Self-pay | Admitting: Family Medicine

## 2022-01-02 DIAGNOSIS — R928 Other abnormal and inconclusive findings on diagnostic imaging of breast: Secondary | ICD-10-CM

## 2022-01-02 DIAGNOSIS — N63 Unspecified lump in unspecified breast: Secondary | ICD-10-CM

## 2022-01-10 ENCOUNTER — Ambulatory Visit
Admission: RE | Admit: 2022-01-10 | Discharge: 2022-01-10 | Disposition: A | Discharge status: Home / Self Care | Payer: 59 | Source: Ambulatory Visit | Attending: Family Medicine | Admitting: Family Medicine

## 2022-01-10 DIAGNOSIS — N6042 Mammary duct ectasia of left breast: Secondary | ICD-10-CM | POA: Diagnosis not present

## 2022-01-10 DIAGNOSIS — N63 Unspecified lump in unspecified breast: Secondary | ICD-10-CM

## 2022-01-10 DIAGNOSIS — N6323 Unspecified lump in the left breast, lower outer quadrant: Secondary | ICD-10-CM | POA: Diagnosis not present

## 2022-01-10 DIAGNOSIS — R928 Other abnormal and inconclusive findings on diagnostic imaging of breast: Secondary | ICD-10-CM

## 2022-01-10 HISTORY — PX: BREAST BIOPSY: SHX20

## 2022-01-12 LAB — SURGICAL PATHOLOGY

## 2022-02-01 ENCOUNTER — Ambulatory Visit: Payer: 59 | Admitting: Surgery

## 2022-02-01 ENCOUNTER — Encounter: Payer: Self-pay | Admitting: Surgery

## 2022-02-01 VITALS — BP 111/71 | HR 76 | Temp 97.9°F | Ht 60.0 in | Wt 146.8 lb

## 2022-02-01 DIAGNOSIS — R1310 Dysphagia, unspecified: Secondary | ICD-10-CM | POA: Diagnosis not present

## 2022-02-01 DIAGNOSIS — K449 Diaphragmatic hernia without obstruction or gangrene: Secondary | ICD-10-CM | POA: Diagnosis not present

## 2022-02-01 NOTE — Patient Instructions (Addendum)
A referral has been placed to Dr. Allen Norris at [facility] for an EGD. They will call you with an appointment.   Your CT is scheduled for 02/07/2022 @ 2:30 pm (arrive by 12:30 pm to drink the contrast) at [facility] on [address]. Nothing to eat or drink 4 hours priors.  Your Barium Swallow is scheduled for 02/14/2022 @ 9 am (arrive 15 minutes prior to appointment) at [facility]. Nothing to eat or drink 3 hours prior.  Hernia, Adult     A hernia happens when an organ or tissue inside your body pushes out through a weak spot in the muscles of your belly (abdomen). This makes a bulge. The bulge may be: In a scar from a surgery that was done in your belly (incisional hernia). Near your belly button (umbilical hernia). In your groin (inguinal hernia). Your groin is the area where your leg meets your lower belly. If you are a female, this type could also be in your scrotum. In your upper thigh (femoral hernia). Inside your belly (hiatal hernia). This happens when your stomach slides above the muscle between your belly and your chest (diaphragm). What are the causes? This condition may be caused by: Lifting heavy things. Coughing over a long period of time. Having trouble pooping (constipation). Trouble pooping can lead to straining. A cut from surgery in your belly. A physical problem that is present at birth. Being very overweight. Smoking. Too much fluid in your belly. A testicle that has not moved down into the scrotum, in males. What are the signs or symptoms? The main symptom is a bulge in the area of the hernia, but a bulge may not always be seen. It may grow bigger or be easier to see when you cough or strain (such as when lifting something heavy). A hernia that can be pushed back into the belly rarely causes pain. A hernia that cannot be pushed back into the belly may lose its blood supply. This may cause: Pain. Fever. A feeling like you may vomit, and  vomiting. Swelling. Trouble pooping. How is this treated? A hernia that is small and painless may not need to be treated. A hernia that is large or painful may be treated with surgery. Surgery to treat a hernia involves pushing the bulge back into place and repairing the weak area of the muscle or belly. Follow these instructions at home: Activity Avoid straining the muscles near your hernia. This can happen when you: Lift something heavy. Poop (have a bowel movement). Do not lift anything that is heavier than 10 lb (4.5 kg), or the limit that you are told. When you lift something heavy, use your leg muscles. Do not use your back muscles to lift. Prevent trouble pooping If told by your doctor, take steps to prevent trouble pooping. You may need to: Drink enough fluid to keep your pee (urine) pale yellow. Take medicines. You will be told what medicines to take. Eat foods that are high in fiber. These include beans, whole grains, and fresh fruits and vegetables. Limit foods that are high in fat and sugar. These include fried or sweet foods. General instructions When you cough, try to cough gently. You may try to push your hernia back in by gently pressing on it when you are lying down. Do not try to force the bulge back in if it will not go in easily. If you are overweight, work with your doctor to lose weight safely. Do not smoke or  use any products that contain nicotine or tobacco. If you need help quitting, ask your doctor. If you will be having surgery, watch your hernia for changes in shape, size, or color. Tell your doctor if you see any changes. Take over-the-counter and prescription medicines only as told by your doctor. Keep all follow-up visits. Contact a doctor if: You get new pain, swelling, or redness near your hernia. You poop fewer times in a week than normal. You have trouble pooping. You have poop that is more dry than normal. You have poop that is harder or larger than  normal. Get help right away if: You have a fever or chills. You have belly pain that gets worse. You feel like you may vomit, or you vomit. Your hernia cannot be pushed in by gently pressing on it when you are lying down. Your hernia: Changes in shape or size. Changes color. Feels hard, or it hurts when you touch it. These symptoms may be an emergency. Get help right away. Call your local emergency services (911 in the [place] Do not wait to see if the symptoms will go away. Do not drive yourself to the hospital. Summary A hernia happens when an organ or tissue inside your body pushes out through a weak spot in the belly muscles. This creates a bulge. If your hernia is small and it does not hurt, you may not need treatment. If your hernia is large or it hurts, you may need surgery. If you will be having surgery, watch your hernia for changes in shape, size, or color. Tell your doctor about any changes. This information is not intended to replace advice given to you by your health care provider. Make sure you discuss any questions you have with your health care provider. Document Revised: 10/27/2019 Document Reviewed: 10/27/2019 Elsevier Patient Education  [employer].

## 2022-02-01 NOTE — Progress Notes (Signed)
Patient ID: Maria Richardson, female   DOB: 11/25/1960, 61 y.o.   MRN: [id number]  HPI Maria Richardson is a 61 y.o. female seen in consultation at request of Dr.Aleskerov for GERD hernia.  She does have a history of COPD that is advanced and prior history of pneumonia related to [place]. She  completed short course of prednisone  Prio appendectomy and ovarian removal Pfannenstiel incision SHe did have a CT chest personally reviewed showing evidence of a sliding hernia with gallstones as well , emphysematous changes on lung parenchyma. S He also reports significant chronic cough and severe GERD despite PPI.  BC and CMP normal  HPI  Past Medical History:  Diagnosis Date   Asthma    COPD (chronic obstructive pulmonary disease) (HCC)    Eczema    Hypoglycemia    Hypothyroidism    Shortness of breath dyspnea    because of asthma   Wears dentures    full lower    Past Surgical History:  Procedure Laterality Date   APPENDECTOMY     BREAST BIOPSY Left 01/10/2022   [place] Core Bx 5:00 Ribbon clip path pending   COLONOSCOPY N/A 03/11/2021   Procedure: COLONOSCOPY;  Surgeon: Lucilla Lame, MD;  Location: [place];  Service: Endoscopy;  Laterality: N/A;   COLONOSCOPY WITH PROPOFOL N/A 07/23/2015   Procedure: COLONOSCOPY WITH PROPOFOL;  Surgeon: Lucilla Lame, MD;  Location: [place];  Service: Endoscopy;  Laterality: N/A;  PLEASE LEAVE PT AT LATER AM    OOPHORECTOMY     as teenager   POLYPECTOMY  07/23/2015   Procedure: POLYPECTOMY;  Surgeon: Lucilla Lame, MD;  Location: [place];  Service: Endoscopy;;   POLYPECTOMY  03/11/2021   Procedure: POLYPECTOMY;  Surgeon: Lucilla Lame, MD;  Location: [place];  Service: Endoscopy;;    Family History  Problem Relation Age of Onset   Diabetes Father    Hypertension Father    Kidney disease Father    Colon cancer Father     Social History Social History   Tobacco Use   Smoking status: Former    Packs/day:  0.50    Years: 30.00    Total pack years: 15.00    Types: Cigarettes    Quit date: 04/27/1998    Years since quitting: 23.7   Smokeless tobacco: Never  Vaping Use   Vaping Use: Never used  Substance Use Topics   Alcohol use: No    Alcohol/week: 0.0 standard drinks of alcohol   Drug use: No    Allergies  Allergen Reactions   Codeine Nausea And Vomiting    Current Outpatient Medications  Medication Sig Dispense Refill   esomeprazole (NEXIUM) 40 MG capsule Take 40 mg by mouth daily.     Fluticasone-Umeclidin-Vilant (TRELEGY ELLIPTA) 100-62.5-25 MCG/INH AEPB Take 1 puff by mouth daily. 180 each 1   levothyroxine (SYNTHROID) 150 MCG tablet TAKE 1 TABLET(150 MCG) BY MOUTH DAILY 90 tablet 0   triamcinolone ointment (KENALOG) 0.5 % Apply 1 application topically 2 (two) times daily. 60 g 0   No current facility-administered medications for this visit.     Review of Systems Full ROS  was asked and was negative except for the information on the HPI  Physical Exam Blood pressure 111/71, pulse 76, temperature 97.9 F (36.6 C), temperature source Oral, height 5' (1.524 m), weight 146 lb 12.8 oz (66.6 kg), SpO2 94 %. CONSTITUTIONAL: NAD. EYES: Pupils are equal, round, and reactive to light, Sclera are non-icteric.  EARS, NOSE, MOUTH AND THROAT: The oropharynx is clear. The oral mucosa is pink and moist. Hearing is intact to voice. LYMPH NODES:  Lymph nodes in the neck are normal. RESPIRATORY:  Lungs are clear. There is normal respiratory effort, with equal breath sounds bilaterally, and without pathologic use of accessory muscles. CARDIOVASCULAR: Heart is regular without murmurs, gallops, or rubs. GI: The abdomen is  soft, nontender, and nondistended. There are no palpable masses. There is no hepatosplenomegaly. There are normal bowel sounds in all quadrants. GU: Rectal deferred.   MUSCULOSKELETAL: Normal muscle strength and tone. No cyanosis or edema.   SKIN: Turgor is good and there  are no pathologic skin lesions or ulcers. NEUROLOGIC: Motor and sensation is grossly normal. Cranial nerves are grossly intact. PSYCH:  Oriented to person, place and time. Affect is normal.  Data Reviewed  I have personally reviewed the patient's imaging, laboratory findings and medical records.    Assessment/Plan 61 yo w GERD and sliding hernia and chronic pulm. Sxs. D/W the pt in detail about her disease process. I do think is worth w/u hiatal hernia rto assess degree of reflux and anatomy. We will order CT A/p As well as Barium swallow and EGD. I will see her back once she completes her w/u. A copy of this report was sent to the referring provider We did talk about potential for [facility] repair robotically, expected outcomes , recover ey, risks, benefits and possible complications  Please note that I spent  60 minutes in this visit including pers. reviewing images, placing orders, counseling/coordination of care and performing appropriate documentation   Caroleen Hamman, MD FACS General Surgeon 02/01/2022, 1:55 PM

## 2022-02-02 NOTE — Progress Notes (Signed)
Pulmonary Clearance has been received from Dr Teodoro Kil office. The patient is cleared at Low risk for surgery.

## 2022-02-07 ENCOUNTER — Ambulatory Visit: Payer: 59

## 2022-02-08 ENCOUNTER — Other Ambulatory Visit: Payer: Self-pay | Admitting: Family Medicine

## 2022-02-14 ENCOUNTER — Ambulatory Visit: Payer: 59

## 2022-03-15 ENCOUNTER — Ambulatory Visit: Payer: 59 | Admitting: Surgery

## 2022-03-30 ENCOUNTER — Other Ambulatory Visit: Payer: Self-pay

## 2022-03-30 ENCOUNTER — Emergency Department
Admission: EM | Admit: 2022-03-30 | Discharge: 2022-03-30 | Disposition: A | Discharge status: Home / Self Care | Payer: 59 | Attending: Emergency Medicine | Admitting: Emergency Medicine

## 2022-03-30 DIAGNOSIS — R112 Nausea with vomiting, unspecified: Secondary | ICD-10-CM | POA: Insufficient documentation

## 2022-03-30 DIAGNOSIS — R110 Nausea: Secondary | ICD-10-CM

## 2022-03-30 MED ORDER — ONDANSETRON 4 MG PO TBDP: 4.0000 mg | ORAL_TABLET | Freq: Three times a day (TID) | ORAL | 0 refills | Status: DC | PRN

## 2022-03-30 NOTE — ED Triage Notes (Signed)
Pt reports ate fish from the grocery store, cabbage and rice and 2 hours later she started vomiting. Pt reports her husband ate the same thing and felt sick as well but he has a higher tolerance. Pt reports came to get something for nausea so she could get back to work.

## 2022-03-30 NOTE — ED Notes (Signed)
Pt agrees to visit front desk in lobby to make sure she is registered fully.

## 2022-03-30 NOTE — ED Notes (Signed)
EDP Kinner at bedside.

## 2022-03-30 NOTE — ED Provider Notes (Signed)
   [facility] Provider Note    Event Date/Time   First MD Initiated Contact with Patient 03/30/22 0901     (approximate)   History   Nausea and Emesis   HPI  Maria Richardson is a 61 y.o. female who presents with complaints of nausea vomiting.  Patient reports she and her husband both got sick from eating fish yesterday, she reports she is here for nausea medication so she can get back to work.  She has no abdominal pain.  No diarrhea.  No fevers reported.     Physical Exam   Triage Vital Signs: ED Triage Vitals  Enc Vitals Group     BP 03/30/22 0826 (!) 143/89     Pulse Rate 03/30/22 0826 84     Resp 03/30/22 0826 18     Temp 03/30/22 0826 97.9 F (36.6 C)     Temp Source 03/30/22 0826 Oral     SpO2 03/30/22 0826 97 %     Weight 03/30/22 0832 66 kg (145 lb 8.1 oz)     Height 03/30/22 0832 1.524 m (5')     Head Circumference --      Peak Flow --      Pain Score 03/30/22 0832 0     Pain Loc --      Pain Edu? --      Excl. in [place]? --     Most recent vital signs: Vitals:   03/30/22 0826  BP: (!) 143/89  Pulse: 84  Resp: 18  Temp: 97.9 F (36.6 C)  SpO2: 97%     General: Awake, no distress.  CV:  Good peripheral perfusion.  Resp:  Normal effort.  Abd:  No distention.  Other:     ED Results / Procedures / Treatments   Labs (all labs ordered are listed, but only abnormal results are displayed) Labs Reviewed - No data to display   EKG     RADIOLOGY     PROCEDURES:  Critical Care performed:   Procedures   MEDICATIONS ORDERED IN ED: Medications - No data to display   IMPRESSION / MDM / [place] / ED COURSE  I reviewed the triage vital signs and the nursing notes. Patient's presentation is most consistent with acute, uncomplicated illness.   Patient with nausea and vomiting, possible foodborne illness versus viral illness.  Patient is well-appearing however in no acute distress, no abdominal tenderness  palpation.  Supportive care appropriate, will prescribe Zofran, outpatient follow-up recommended, return precautions discussed       FINAL CLINICAL IMPRESSION(S) / ED DIAGNOSES   Final diagnoses:  Nausea     Rx / DC Orders   ED Discharge Orders          Ordered    ondansetron (ZOFRAN-ODT) 4 MG disintegrating tablet  Every 8 hours PRN        03/30/22 0905             Note:  This document was prepared using Dragon voice recognition software and may include unintentional dictation errors.   Lavonia Drafts, MD 03/30/22 [phone]

## 2022-03-30 NOTE — ED Notes (Signed)
Pt denies any symptoms except vomiting; states her and her husband think it was what they ate last night; denies fever; vomited x5. Reports mild nausea. Pt has emesis bag currently. Pt reports dry heaving at times. Pt's resp reg/unlabored, skin dry and sitting calmly in chair. Pt steady upon ambulation to room.

## 2022-05-05 ENCOUNTER — Ambulatory Visit: Admission: RE | Admit: 2022-05-05 | Payer: 59 | Source: Ambulatory Visit

## 2022-05-09 ENCOUNTER — Other Ambulatory Visit: Payer: Self-pay | Admitting: Family Medicine

## 2022-05-12 ENCOUNTER — Other Ambulatory Visit: Payer: 59

## 2022-05-12 ENCOUNTER — Ambulatory Visit: Payer: 59 | Admitting: Family Medicine

## 2022-05-18 ENCOUNTER — Other Ambulatory Visit: Payer: Self-pay | Admitting: Family Medicine

## 2022-05-18 ENCOUNTER — Telehealth: Payer: 59 | Admitting: Gastroenterology

## 2022-05-19 ENCOUNTER — Other Ambulatory Visit: Payer: Self-pay | Admitting: Family Medicine

## 2022-05-19 DIAGNOSIS — E039 Hypothyroidism, unspecified: Secondary | ICD-10-CM

## 2022-05-19 LAB — TSH: TSH: 10.9 u[IU]/mL — ABNORMAL HIGH (ref 0.450–4.500)

## 2022-05-19 MED ORDER — LEVOTHYROXINE SODIUM 150 MCG PO TABS: 150.0000 ug | ORAL_TABLET | Freq: Every day | ORAL | 0 refills | Status: DC

## 2022-05-19 NOTE — Progress Notes (Deleted)
Name: Maria Richardson   MRN: [id number]    DOB: 11-14-1960   Date:05/19/2022       Progress Note  Subjective  Chief Complaint  Follow Up  HPI  Adult hypothyroidism: she has been taking levothyroxine 150 mcg daily but only over the past two weeks, TSH is at goal, advised to continue current dose and return in 1 month to recheck level. She denies hair loss, no change in bowel movements, denies constipation    Pre-diabetes: father had a history of  DM, her hgbA1C was up to 6.5 %  back in 08/2010 but is down to 5.9 % today , she is avoiding sweets, but has fatigue about 30 minutes after a meal, she is not sure if it happens after a low carb diet. I will give her a list of low carb diet to see if symptoms are not present on a very low carb diet    COPD/Asthma/hiatal hernia : under the care of Dr. Jaynie Crumble, she used to smoke 25 years ago, she had worsening of cough last Fall and CT chest was done 04/2021, that showed hiatal hernia, emphysema and was given a PPI, she has been  taking Trelegy daily for a while and seems to help , she just started taking Nexium  40 mg two weeks ago and a very small decrease in cough since     Vulva lesion: she saw Dr. 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 but she states no longer having pruritis , she was advised to go back in 3 months but she lost to follow up, advised her to contact [place] side and schedule a follow up  Eczema: she has sensitivity to metals and usually has oubreaks around her neck due to necklace and needs refill of topical medication   Atherosclerosis of aorta: discussed starting her on statin therapy   Patient Active Problem List   Diagnosis Date Noted   Atherosclerosis of aorta ([place]) 11/11/2021   Personal history of colonic polyps    Polyp of ascending colon    Other specified diseases of intestine    Cervical radiculitis 11/11/2019   Pulmonary emphysema ([place])    History of COVID-19 04/2019    Dyslipidemia 08/04/2015   Benign neoplasm of sigmoid colon    Seasonal allergic rhinitis Q000111Q   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   History of anemia 01/07/2015   Excess weight 01/07/2015   Pre-diabetes 01/07/2015   Leiomyoma of uterus 01/07/2015   Calculus of gallbladder 01/07/2015   Adult hypothyroidism 09/24/2009   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/2009   Vitamin D deficiency 05/18/2009    Past Surgical History:  Procedure Laterality Date   APPENDECTOMY     BREAST BIOPSY Left 01/10/2022   [place] Core Bx 5:00 Ribbon clip path pending   COLONOSCOPY N/A 03/11/2021   Procedure: COLONOSCOPY;  Surgeon: Lucilla Lame, MD;  Location: [facility];  Service: Endoscopy;  Laterality: N/A;   COLONOSCOPY WITH PROPOFOL N/A 07/23/2015   Procedure: COLONOSCOPY WITH PROPOFOL;  Surgeon: Lucilla Lame, MD;  Location: [facility];  Service: Endoscopy;  Laterality: N/A;  PLEASE LEAVE PT AT LATER AM    OOPHORECTOMY     as teenager   POLYPECTOMY  07/23/2015   Procedure: POLYPECTOMY;  Surgeon: Lucilla Lame, MD;  Location: [facility];  Service: Endoscopy;;   POLYPECTOMY  03/11/2021   Procedure: POLYPECTOMY;  Surgeon: Lucilla Lame, MD;  Location: [facility];  Service: Endoscopy;;    Family History  Problem Relation Age of Onset   Diabetes Father    Hypertension Father    Kidney disease Father    Colon cancer Father     Social History   Tobacco Use   Smoking status: Former    Packs/day: 0.50    Years: 30.00    Total pack years: 15.00    Types: Cigarettes    Quit date: 04/27/1998    Years since quitting: 24.0   Smokeless tobacco: Never  Substance Use Topics   Alcohol use: No    Alcohol/week: 0.0 standard drinks of alcohol     Current Outpatient Medications:    esomeprazole (NEXIUM) 40 MG capsule, Take 40 mg by mouth daily., Disp: , Rfl:    Fluticasone-Umeclidin-Vilant (TRELEGY ELLIPTA) 100-62.5-25 MCG/INH AEPB, Take 1 puff by mouth daily., Disp: 180 each, Rfl: 1    levothyroxine (SYNTHROID) 150 MCG tablet, TAKE 1 TABLET(150 MCG) BY MOUTH DAILY, Disp: 90 tablet, Rfl: 0   ondansetron (ZOFRAN-ODT) 4 MG disintegrating tablet, Take 1 tablet (4 mg total) by mouth every 8 (eight) hours as needed for nausea or vomiting., Disp: 20 tablet, Rfl: 0   triamcinolone ointment (KENALOG) 0.5 %, Apply 1 application topically 2 (two) times daily., Disp: 60 g, Rfl: 0  Allergies  Allergen Reactions   Codeine Nausea And Vomiting    I personally reviewed active problem list, medication list, allergies, family history, social history, health maintenance with the patient/caregiver today.   ROS  ***  Objective  There were no vitals filed for this visit.  There is no height or weight on file to calculate BMI.  Physical Exam ***  Recent Results (from the past 2160 hour(s))  TSH     Status: Abnormal   Collection Time: 05/18/22  1:01 PM  Result Value Ref Range   TSH 10.900 (H) 0.450 - 4.500 uIU/mL    PHQ2/9:    11/11/2021    4:11 PM 11/11/2021    4:01 PM 02/07/2021   10:31 AM 09/27/2020    1:49 PM 06/30/2020    3:50 PM  Depression screen PHQ 2/9  Decreased Interest 0 0 0 0 0  Down, Depressed, Hopeless 0 0 0 0 0  PHQ - 2 Score 0 0 0 0 0  Altered sleeping 0 0 0    Tired, decreased energy 0 0 0    Change in appetite 0 0 0    Feeling bad or failure about yourself  0 0 0    Trouble concentrating 0 0 0    Moving slowly or fidgety/restless 0 0 0    Suicidal thoughts 0 0 0    PHQ-9 Score 0 0 0    Difficult doing work/chores Not difficult at all  Not difficult at all      phq 9 is {gen pos [id number]   Fall Risk:    11/11/2021    4:01 PM 02/07/2021   10:31 AM 09/27/2020    1:48 PM 06/30/2020    3:49 PM 01/15/2020    8:30 AM  Fall Risk   Falls in the past year? 0 0 0 0 0  Number falls in past yr:  0 0 0 0  Injury with Fall?  0 0 0 0  Risk for fall due to : No Fall Risks    No Fall Risks  Follow up Falls prevention discussed  Falls evaluation completed   Falls evaluation completed;Falls prevention discussed;Education provided      Functional Status Survey:  Assessment & Plan  *** There are no diagnoses linked to this encounter.

## 2022-05-22 ENCOUNTER — Ambulatory Visit: Payer: 59 | Admitting: Family Medicine

## 2022-06-05 ENCOUNTER — Ambulatory Visit: Payer: 59 | Admitting: Nurse Practitioner

## 2022-06-05 ENCOUNTER — Encounter: Payer: Self-pay | Admitting: Nurse Practitioner

## 2022-06-05 ENCOUNTER — Other Ambulatory Visit: Payer: Self-pay

## 2022-06-05 VITALS — BP 118/72 | HR 100 | Temp 98.2°F | Resp 18 | Ht 60.0 in | Wt 145.6 lb

## 2022-06-05 DIAGNOSIS — J069 Acute upper respiratory infection, unspecified: Secondary | ICD-10-CM

## 2022-06-05 DIAGNOSIS — J4489 Other specified chronic obstructive pulmonary disease: Secondary | ICD-10-CM

## 2022-06-05 LAB — POCT INFLUENZA A/B
Influenza A, POC: NEGATIVE
Influenza B, POC: NEGATIVE

## 2022-06-05 MED ORDER — PREDNISONE 10 MG (21) PO TBPK: ORAL_TABLET | ORAL | 0 refills | Status: DC

## 2022-06-05 MED ORDER — PROMETHAZINE-DM 6.25-15 MG/5ML PO SYRP: 5.0000 mL | ORAL_SOLUTION | Freq: Four times a day (QID) | ORAL | 0 refills | Status: DC | PRN

## 2022-06-05 MED ORDER — BENZONATATE 100 MG PO CAPS: 200.0000 mg | ORAL_CAPSULE | Freq: Two times a day (BID) | ORAL | 0 refills | Status: DC | PRN

## 2022-06-05 NOTE — Assessment & Plan Note (Signed)
Continue trelegy, start steroid taper

## 2022-06-05 NOTE — Progress Notes (Signed)
BP 118/72   Pulse 100   Temp 98.2 F (36.8 C) (Oral)   Resp 18   Ht 5' (1.524 m)   Wt 145 lb 9.6 oz (66 kg)   SpO2 96%   BMI 28.44 kg/m    Subjective:    Patient ID: Maria Richardson, female    DOB: [birth year], 62 y.o.   MRN: [id number]  HPI: Maria Richardson is a 62 y.o. female  Chief Complaint  Patient presents with   URI    Cough, congested, headache, hoarseness for 1 week   URI: patient reports symptoms started a week ago.  Symptoms include cough, congestion, headache and lost voice.  She denies any shortness of breath or fever. She is outside the treatment window, she would like to be tested.   She says she has tried theraflu, robitussin, and Elderberry. Will get covid and flu test.  Recommend taking zyrtec, flonase, mucinex, vitamin d, vitamin c, and zinc. Push fluids and get rest.   Will send in steroid taper, phenergan-DM and tessalon perls.   COPD: she currently takes trelegy for her copd.  She denies any shortness of breath.  Will give a steroid taper.   Relevant past medical, surgical, family and social history reviewed and updated as indicated. Interim medical history since our last visit reviewed. Allergies and medications reviewed and updated.  Review of Systems  Constitutional: Negative for fever or weight change.  HEENT: positive for nasal congestion, hoarseness Respiratory: positive for cough and negative for shortness of breath.   Cardiovascular: Negative for chest pain or palpitations.  Gastrointestinal: Negative for abdominal pain, no bowel changes.  Musculoskeletal: Negative for gait problem or joint swelling.  Skin: Negative for rash.  Neurological: Negative for dizziness or headache.  No other specific complaints in a complete review of systems (except as listed in HPI above).      Objective:    BP 118/72   Pulse 100   Temp 98.2 F (36.8 C) (Oral)   Resp 18   Ht 5' (1.524 m)   Wt 145 lb 9.6 oz (66 kg)   SpO2 96%   BMI 28.44 kg/m   Wt  Readings from Last 3 Encounters:  06/05/22 145 lb 9.6 oz (66 kg)  03/30/22 145 lb 8.1 oz (66 kg)  02/01/22 146 lb 12.8 oz (66.6 kg)    Physical Exam  Constitutional: Patient appears well-developed and well-nourished.  No distress.  HEENT: head atraumatic, normocephalic, pupils equal and reactive to light, ears Tms clear, neck supple, throat within normal limits, no lymphadenopathy  Cardiovascular: Normal rate, regular rhythm and normal heart sounds.  No murmur heard. No BLE edema. Pulmonary/Chest: Effort normal and breath sounds normal. No respiratory distress. Abdominal: Soft.  There is no tenderness. Psychiatric: Patient has a normal mood and affect. behavior is normal. Judgment and thought content normal.  Results for orders placed or performed in visit on 06/05/22  POCT Influenza A/B  Result Value Ref Range   Influenza A, POC Negative Negative   Influenza B, POC Negative Negative      Assessment & Plan:   Problem List Items Addressed This Visit       Respiratory   COPD with asthma    Continue trelegy, start steroid taper      Relevant Medications   predniSONE (STERAPRED UNI-PAK 21 TAB) 10 MG (21) TBPK tablet   benzonatate (TESSALON) 100 MG capsule   promethazine-dextromethorphan (PROMETHAZINE-DM) 6.25-15 MG/5ML syrup   Other Visit Diagnoses  Viral upper respiratory tract infection    -  Primary   start taking zyrtec, flonase, mucinex, vitamin d, vitamin c, and zinc. Push fluids and get rest.   Will send in steroid taper, phenergan-DM and tessalon perls.   Relevant Medications   predniSONE (STERAPRED UNI-PAK 21 TAB) 10 MG (21) TBPK tablet   benzonatate (TESSALON) 100 MG capsule   promethazine-dextromethorphan (PROMETHAZINE-DM) 6.25-15 MG/5ML syrup   Other Relevant Orders   POCT Influenza A/B (Completed)   Novel Coronavirus, NAA ([facility])        Follow up plan: Return if symptoms worsen or fail to improve.

## 2022-06-06 ENCOUNTER — Telehealth: Payer: Self-pay

## 2022-06-06 LAB — SPECIMEN STATUS REPORT

## 2022-06-06 LAB — NOVEL CORONAVIRUS, NAA: SARS-CoV-2, NAA: NOT DETECTED

## 2022-06-06 NOTE — Telephone Encounter (Signed)
Pt called for Flu and COVID test results, given flu negative, advised that COVID was not back yet and provider will call when we get results. No further assistance needed.

## 2022-06-27 NOTE — Progress Notes (Unsigned)
Name: Maria Richardson   MRN: [id number]    DOB: 1960/04/16   Date:06/28/2022       Progress Note  Subjective  Chief Complaint  Follow Up  HPI  Adult hypothyroidism: she has been taking levothyroxine 150 mcg M-Fridays, she forgets to take it on weekends - she has been consistent since last visit . She denies hair loss, no change in bowel movements, denies constipation    Pre-diabetes: father had a history of  DM, her hgbA1C was up to 6.5 %  back in 08/2010 but is down to 5.9 % today , she is avoiding sweets, she was having  fatigue about 30 minutes after a meal, but since she changed her diet and avoiding sodas symptoms have improved, we will recheck A1C  COPD/Emphysema Asthma/hiatal hernia : under the care of Dr. Jaynie Crumble, she used to smoke 25 years ago, she had worsening of cough last Fall and CT chest was done 04/2021, that showed hiatal hernia, emphysema and was given a PPI ( she has stopped taking it ) , she has been  taking Trelegy daily for a while and seems to help. She still has a cough but no wheezing or SOB   Vulva lesion: she saw Dr. 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 but she states no longer having pruritis , she was advised to go back in 3 months but she lost to follow up, she needs to go back for GYN, she states she will stop by today to schedule a visit   Eczema: she has sensitivity to metals and usually has oubreaks around her neck due to necklace she still has a refill of topical medication at home   Atherosclerosis of aorta: discussed starting her on statin therapy , she is willing to try it   Hiatal hernia: doing well, GERD symptoms improved with life style modification   Patient Active Problem List   Diagnosis Date Noted   Atherosclerosis of aorta ([place]) 11/11/2021   Personal history of colonic polyps    Polyp of ascending colon    Other specified diseases of intestine    Cervical radiculitis 11/11/2019   Pulmonary emphysema  ([place])    History of COVID-19 04/2019   Dyslipidemia 08/04/2015   Benign neoplasm of sigmoid colon    Seasonal allergic rhinitis Q000111Q   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   History of anemia 01/07/2015   Excess weight 01/07/2015   Pre-diabetes 01/07/2015   Leiomyoma of uterus 01/07/2015   Calculus of gallbladder 01/07/2015   Adult hypothyroidism 09/24/2009   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/2009   Vitamin D deficiency 05/18/2009    Past Surgical History:  Procedure Laterality Date   APPENDECTOMY     BREAST BIOPSY Left 01/10/2022   [place] Core Bx 5:00 Ribbon clip path pending   COLONOSCOPY N/A 03/11/2021   Procedure: COLONOSCOPY;  Surgeon: Lucilla Lame, MD;  Location: [facility];  Service: Endoscopy;  Laterality: N/A;   COLONOSCOPY WITH PROPOFOL N/A 07/23/2015   Procedure: COLONOSCOPY WITH PROPOFOL;  Surgeon: Lucilla Lame, MD;  Location: [facility];  Service: Endoscopy;  Laterality: N/A;  PLEASE LEAVE PT AT LATER AM    OOPHORECTOMY     as teenager   POLYPECTOMY  07/23/2015   Procedure: POLYPECTOMY;  Surgeon: Lucilla Lame, MD;  Location: [facility];  Service: Endoscopy;;   POLYPECTOMY  03/11/2021   Procedure: POLYPECTOMY;  Surgeon: Lucilla Lame, MD;  Location: [facility];  Service: Endoscopy;;    Family  History  Problem Relation Age of Onset   Diabetes Father    Hypertension Father    Kidney disease Father    Colon cancer Father     Social History   Tobacco Use   Smoking status: Former    Packs/day: 0.50    Years: 30.00    Additional pack years: 0.00    Total pack years: 15.00    Types: Cigarettes    Quit date: 04/27/1998    Years since quitting: 24.1   Smokeless tobacco: Never  Substance Use Topics   Alcohol use: No    Alcohol/week: 0.0 standard drinks of alcohol     Current Outpatient Medications:    Fluticasone-Umeclidin-Vilant (TRELEGY ELLIPTA) 100-62.5-25 MCG/INH AEPB, Take 1 puff by mouth daily., Disp: 180 each, Rfl: 1   levothyroxine  (SYNTHROID) 150 MCG tablet, Take 1 tablet (150 mcg total) by mouth daily before breakfast., Disp: 90 tablet, Rfl: 0   rosuvastatin (CRESTOR) 5 MG tablet, Take 1 tablet (5 mg total) by mouth daily., Disp: 90 tablet, Rfl: 3   triamcinolone ointment (KENALOG) 0.5 %, Apply 1 application topically 2 (two) times daily., Disp: 60 g, Rfl: 0  Allergies  Allergen Reactions   Codeine Nausea And Vomiting    I personally reviewed active problem list, medication list, allergies, family history, social history, health maintenance with the patient/caregiver today.   ROS  Ten systems reviewed and is negative except as mentioned in HPI   Objective  Vitals:   06/28/22 1148  BP: 120/70  Pulse: 65  Resp: 14  Temp: 97.7 F (36.5 C)  TempSrc: Oral  SpO2: 98%  Weight: 147 lb 9.6 oz (67 kg)  Height: 5' (1.524 m)    Body mass index is 28.83 kg/m.  Physical Exam  Constitutional: Patient appears well-developed and well-nourished.  No distress.  HEENT: head atraumatic, normocephalic, pupils equal and reactive to light, neck supple Cardiovascular: Normal rate, regular rhythm and normal heart sounds.  No murmur heard. No BLE edema. Pulmonary/Chest: Effort normal and breath sounds normal. No respiratory distress. Abdominal: Soft.  There is no tenderness. Psychiatric: Patient has a normal mood and affect. behavior is normal. Judgment and thought content normal.   Recent Results (from the past 2160 hour(s))  TSH     Status: Abnormal   Collection Time: 05/18/22  1:01 PM  Result Value Ref Range   TSH 10.900 (H) 0.450 - 4.500 uIU/mL  Novel Coronavirus, NAA ([facility])     Status: None   Collection Time: 06/05/22 12:00 AM   Specimen: Nasopharyngeal(NP) swabs in vial transport medium   Nasopharynge  Previous  Result Value Ref Range   SARS-CoV-2, NAA Not Detected Not Detected    Comment: This nucleic acid amplification test was developed and its performance characteristics determined by [employer]. Nucleic acid amplification tests include RT-PCR and TMA. This test has not been FDA cleared or approved. This test has been authorized by FDA under an Emergency Use Authorization (EUA). This test is only authorized for the duration of time the declaration that circumstances exist justifying the authorization of the emergency use of in vitro diagnostic tests for detection of SARS-CoV-2 virus and/or diagnosis of COVID-19 infection under section 564(b)(1) of the Act, 21 U.S.C. [id number]) (1), unless the authorization is terminated or revoked sooner. When diagnostic testing is negative, the possibility of a false negative result should be considered in the context of a patient's recent exposures and the presence of clinical signs and symptoms consistent with COVID-19. An individual  without symptoms of COVID-19 and who is not shedding SARS-CoV-2 virus wo uld expect to have a negative (not detected) result in this assay.   Specimen status report     Status: None   Collection Time: 06/05/22 12:00 AM  Result Value Ref Range   specimen status report Comment     Comment: Please note Please note The date and/or time of collection was not indicated on the requisition as required by state and federal law.  The date of receipt of the specimen was used as the collection date if not supplied.   POCT Influenza A/B     Status: None   Collection Time: 06/05/22  9:06 AM  Result Value Ref Range   Influenza A, POC Negative Negative   Influenza B, POC Negative Negative    PHQ2/9:    06/28/2022   11:50 AM 06/05/2022    8:45 AM 11/11/2021    4:11 PM 11/11/2021    4:01 PM 02/07/2021   10:31 AM  Depression screen PHQ 2/9  Decreased Interest 0 0 0 0 0  Down, Depressed, Hopeless 0 0 0 0 0  PHQ - 2 Score 0 0 0 0 0  Altered sleeping 0  0 0 0  Tired, decreased energy 0  0 0 0  Change in appetite 0  0 0 0  Feeling bad or failure about yourself  0  0 0 0  Trouble concentrating 0  0 0 0   Moving slowly or fidgety/restless 0  0 0 0  Suicidal thoughts 0  0 0 0  PHQ-9 Score 0  0 0 0  Difficult doing work/chores   Not difficult at all  Not difficult at all    phq 9 is negative   Fall Risk:    06/28/2022   11:50 AM 06/05/2022    8:45 AM 11/11/2021    4:01 PM 02/07/2021   10:31 AM 09/27/2020    1:48 PM  Fall Risk   Falls in the past year? 0 0 0 0 0  Number falls in past yr:  0  0 0  Injury with Fall?  0  0 0  Risk for fall due to : No Fall Risks  No Fall Risks    Follow up Falls prevention discussed  Falls prevention discussed  Falls evaluation completed     Assessment & Plan  1. Other emphysema ([place])  Under the care of Dr. Talbert Cage  2. Atherosclerosis of aorta ([facility])  - rosuvastatin (CRESTOR) 5 MG tablet; Take 1 tablet (5 mg total) by mouth daily.  Dispense: 90 tablet; Refill: 3  3. Adult hypothyroidism  - TSH  4. Pre-diabetes  - Hemoglobin A1c  5. Dyslipidemia  - Lipid panel - Comprehensive metabolic panel - rosuvastatin (CRESTOR) 5 MG tablet; Take 1 tablet (5 mg total) by mouth daily.  Dispense: 90 tablet; Refill: 3  6. Vitamin D deficiency  - VITAMIN D 25 Hydroxy (Vit-D Deficiency, Fractures)  7. Elevated liver enzymes  - Comprehensive metabolic panel - Iron, TIBC and Ferritin Panel - Alpha-1-antitrypsin - Acute Hep Panel & Hep B Surface Ab  8. Screening for deficiency anemia  - CBC with Differential/Platelet  9. Calculus of gallbladder without cholecystitis without obstruction   10. Hiatal hernia  Doing better

## 2022-06-28 ENCOUNTER — Ambulatory Visit: Payer: 59 | Admitting: Family Medicine

## 2022-06-28 ENCOUNTER — Encounter: Payer: Self-pay | Admitting: Family Medicine

## 2022-06-28 VITALS — BP 120/70 | HR 65 | Temp 97.7°F | Resp 14 | Ht 60.0 in | Wt 147.6 lb

## 2022-06-28 DIAGNOSIS — E559 Vitamin D deficiency, unspecified: Secondary | ICD-10-CM

## 2022-06-28 DIAGNOSIS — K8020 Calculus of gallbladder without cholecystitis without obstruction: Secondary | ICD-10-CM

## 2022-06-28 DIAGNOSIS — K449 Diaphragmatic hernia without obstruction or gangrene: Secondary | ICD-10-CM

## 2022-06-28 DIAGNOSIS — J438 Other emphysema: Secondary | ICD-10-CM

## 2022-06-28 DIAGNOSIS — R7303 Prediabetes: Secondary | ICD-10-CM | POA: Diagnosis not present

## 2022-06-28 DIAGNOSIS — E785 Hyperlipidemia, unspecified: Secondary | ICD-10-CM

## 2022-06-28 DIAGNOSIS — E039 Hypothyroidism, unspecified: Secondary | ICD-10-CM

## 2022-06-28 DIAGNOSIS — I700 Atherosclerosis of aorta: Secondary | ICD-10-CM | POA: Diagnosis not present

## 2022-06-28 DIAGNOSIS — R748 Abnormal levels of other serum enzymes: Secondary | ICD-10-CM

## 2022-06-28 DIAGNOSIS — Z130 Encounter for screening for diseases of the blood and blood-forming organs and certain disorders involving the immune mechanism: Secondary | ICD-10-CM

## 2022-06-28 MED ORDER — ROSUVASTATIN CALCIUM 5 MG PO TABS: 5.0000 mg | ORAL_TABLET | Freq: Every day | ORAL | 3 refills | Status: DC

## 2022-08-10 ENCOUNTER — Other Ambulatory Visit: Payer: Self-pay | Admitting: Family Medicine

## 2022-08-12 LAB — CBC WITH DIFFERENTIAL/PLATELET
Basophils Absolute: 0.1 10*3/uL (ref 0.0–0.2)
Basos: 1 %
EOS (ABSOLUTE): 0.6 10*3/uL — ABNORMAL HIGH (ref 0.0–0.4)
Eos: 8 %
Hematocrit: 44.2 % (ref 34.0–46.6)
Hemoglobin: 14.1 g/dL (ref 11.1–15.9)
Immature Grans (Abs): 0 10*3/uL (ref 0.0–0.1)
Immature Granulocytes: 0 %
Lymphocytes Absolute: 1.9 10*3/uL (ref 0.7–3.1)
Lymphs: 25 %
MCH: 28.2 pg (ref 26.6–33.0)
MCHC: 31.9 g/dL (ref 31.5–35.7)
MCV: 88 fL (ref 79–97)
Monocytes Absolute: 0.5 10*3/uL (ref 0.1–0.9)
Monocytes: 6 %
Neutrophils Absolute: 4.6 10*3/uL (ref 1.4–7.0)
Neutrophils: 60 %
Platelets: 249 10*3/uL (ref 150–450)
RBC: 5 x10E6/uL (ref 3.77–5.28)
RDW: 13.5 % (ref 11.7–15.4)
WBC: 7.7 10*3/uL (ref 3.4–10.8)

## 2022-08-12 LAB — COMPREHENSIVE METABOLIC PANEL
ALT: 35 IU/L — ABNORMAL HIGH (ref 0–32)
AST: 37 IU/L (ref 0–40)
Albumin/Globulin Ratio: 1.4 (ref 1.2–2.2)
Albumin: 4.2 g/dL (ref 3.9–4.9)
Alkaline Phosphatase: 115 IU/L (ref 44–121)
BUN/Creatinine Ratio: 25 (ref 12–28)
BUN: 21 mg/dL (ref 8–27)
Bilirubin Total: 0.3 mg/dL (ref 0.0–1.2)
CO2: 22 mmol/L (ref 20–29)
Calcium: 10.3 mg/dL (ref 8.7–10.3)
Chloride: 105 mmol/L (ref 96–106)
Creatinine, Ser: 0.84 mg/dL (ref 0.57–1.00)
Globulin, Total: 3.1 g/dL (ref 1.5–4.5)
Glucose: 89 mg/dL (ref 70–99)
Potassium: 4.8 mmol/L (ref 3.5–5.2)
Sodium: 142 mmol/L (ref 134–144)
Total Protein: 7.3 g/dL (ref 6.0–8.5)
eGFR: 79 mL/min/{1.73_m2} (ref >59)

## 2022-08-12 LAB — ACUTE HEP PANEL AND HEP B SURFACE AB
Hep A IgM: NEGATIVE
Hep B C IgM: NEGATIVE
Hep C Virus Ab: NONREACTIVE
Hepatitis B Surf Ab Quant: <3.5 m[IU]/mL — ABNORMAL LOW (ref >9.9)
Hepatitis B Surface Ag: NEGATIVE

## 2022-08-12 LAB — IRON,TIBC AND FERRITIN PANEL
Ferritin: 483 ng/mL — ABNORMAL HIGH (ref 15–150)
Iron Saturation: 20 % (ref 15–55)
Iron: 58 ug/dL (ref 27–139)
Total Iron Binding Capacity: 284 ug/dL (ref 250–450)
UIBC: 226 ug/dL (ref 118–369)

## 2022-08-12 LAB — LIPID PANEL
Chol/HDL Ratio: 3.4 ratio (ref 0.0–4.4)
Cholesterol, Total: 192 mg/dL (ref 100–199)
HDL: 56 mg/dL (ref >39)
LDL Chol Calc (NIH): 127 mg/dL — ABNORMAL HIGH (ref 0–99)
Triglycerides: 47 mg/dL (ref 0–149)
VLDL Cholesterol Cal: 9 mg/dL (ref 5–40)

## 2022-08-12 LAB — HEMOGLOBIN A1C
Est. average glucose Bld gHb Est-mCnc: 128 mg/dL
Hgb A1c MFr Bld: 6.1 % — ABNORMAL HIGH (ref 4.8–5.6)

## 2022-08-12 LAB — VITAMIN D 25 HYDROXY (VIT D DEFICIENCY, FRACTURES): Vit D, 25-Hydroxy: 23.5 ng/mL — ABNORMAL LOW (ref 30.0–100.0)

## 2022-08-12 LAB — TSH: TSH: 0.32 u[IU]/mL — ABNORMAL LOW (ref 0.450–4.500)

## 2022-08-12 LAB — ALPHA-1-ANTITRYPSIN: A-1 Antitrypsin: 168 mg/dL (ref 101–187)

## 2022-08-14 ENCOUNTER — Other Ambulatory Visit: Payer: Self-pay | Admitting: Family Medicine

## 2022-08-14 ENCOUNTER — Telehealth: Payer: Self-pay | Admitting: Family Medicine

## 2022-08-14 DIAGNOSIS — E039 Hypothyroidism, unspecified: Secondary | ICD-10-CM

## 2022-08-14 MED ORDER — LEVOTHYROXINE SODIUM 137 MCG PO TABS: 137.0000 ug | ORAL_TABLET | Freq: Every day | ORAL | 0 refills | Status: DC

## 2022-08-14 NOTE — Telephone Encounter (Signed)
Copied from CRM [id number]. Topic: General - Call Back - No Documentation >> Aug 14, 2022  1:27 PM Maria Richardson wrote: Reason for CRM: Pt stated that she had a missed call from the office and thinks it was regarding her test results. Pt requests call back. Cb# [phone]

## 2022-08-15 ENCOUNTER — Telehealth: Payer: Self-pay

## 2022-08-15 DIAGNOSIS — E039 Hypothyroidism, unspecified: Secondary | ICD-10-CM

## 2022-08-15 NOTE — Telephone Encounter (Signed)
Spoke with patient and relayed lab results, dosage changes per Dr. Sowles and answered all questions and addressed concerns patient had at this time. Patient was advised to contact us if she thought of anything further and after determining with her insurance where she can get the Hep B vaccine covered. Patient will return in 5 weeks for repeat TSH as advised.  

## 2022-08-15 NOTE — Telephone Encounter (Signed)
Spoke with patient and relayed lab results, dosage changes per Dr. Carlynn Purl and answered all questions and addressed concerns patient had at this time. Patient was advised to contact us if she thought of anything further and after determining with her insurance where she can get the Hep B vaccine covered. Patient will return in 5 weeks for repeat TSH as advised.

## 2022-08-15 NOTE — Telephone Encounter (Signed)
Duplicate

## 2022-08-25 ENCOUNTER — Ambulatory Visit: Payer: Self-pay

## 2022-08-25 NOTE — Telephone Encounter (Signed)
Pt is calling to go over medication instructions for Friday -Sunday for levothyroxine (SYNTHROID) 137 MCG tablet [[id number]]. Please advise            Chief Complaint: Pt. Calling to verify instructions on levothyroxine - take 1 pill Monday- Friday. Verbalizes understanding. Symptoms: n/a Frequency: n/a Pertinent Negatives: Patient denies n/a Disposition: [] ED /[] Urgent Care (no appt availability in office) / [] Appointment(In office/virtual)/ []  [facility]/ [] Home Care/ [] Refused Recommended Disposition /[] [facility]/ []  Follow-up with PCP Additional Notes:   Answer Assessment - Initial Assessment Questions 1. NAME of MEDICINE: "What medicine(s) are you calling about?"     Levothyroxine 2. QUESTION: "What is your question?" (e.g., double dose of medicine, side effect)     Instructions 3. PRESCRIBER: "Who prescribed the medicine?" Reason: if prescribed by specialist, call should be referred to that group.     Sowles 4. SYMPTOMS: "Do you have any symptoms?" If Yes, ask: "What symptoms are you having?"  "How bad are the symptoms (e.g., mild, moderate, severe)     N/a 5. PREGNANCY:  "Is there any chance that you are pregnant?" "When was your last menstrual period?"     No  Protocols used: Medication Question Call-A-AH

## 2022-09-14 ENCOUNTER — Ambulatory Visit
Admission: RE | Admit: 2022-09-14 | Discharge: 2022-09-14 | Disposition: A | Discharge status: Home / Self Care | Payer: 59 | Source: Ambulatory Visit | Attending: Surgery | Admitting: Surgery

## 2022-09-14 DIAGNOSIS — R1310 Dysphagia, unspecified: Secondary | ICD-10-CM | POA: Insufficient documentation

## 2022-09-26 ENCOUNTER — Ambulatory Visit
Admission: RE | Admit: 2022-09-26 | Discharge: 2022-09-26 | Disposition: A | Discharge status: Home / Self Care | Payer: 59 | Source: Ambulatory Visit | Attending: Surgery | Admitting: Surgery

## 2022-09-26 DIAGNOSIS — R1310 Dysphagia, unspecified: Secondary | ICD-10-CM | POA: Diagnosis present

## 2022-09-26 MED ORDER — IOHEXOL 300 MG/ML  SOLN
100.0000 mL | Freq: Once | INTRAMUSCULAR | Status: AC | PRN
  Administered 2022-09-26: 100 mL via INTRAVENOUS

## 2022-10-09 ENCOUNTER — Encounter: Payer: Self-pay | Admitting: Surgery

## 2022-10-09 ENCOUNTER — Ambulatory Visit: Payer: 59 | Admitting: Surgery

## 2022-10-09 VITALS — BP 132/77 | HR 59 | Temp 98.0°F | Ht 60.0 in | Wt 143.0 lb

## 2022-10-09 DIAGNOSIS — K449 Diaphragmatic hernia without obstruction or gangrene: Secondary | ICD-10-CM | POA: Diagnosis not present

## 2022-10-09 NOTE — Patient Instructions (Addendum)
We will send a new referral to Dr Annabell Sabal office. They will call you to schedule this.  Follow up here in 3 months. We will send you a letter about this appointment.   Please call and ask to speak with a nurse if you develop questions or concerns.   Gastroesophageal Reflux Disease, Adult  Gastroesophageal reflux (GER) happens when acid from the stomach flows up into the tube that connects the mouth and the stomach (esophagus). Normally, food travels down the esophagus and stays in the stomach to be digested. With GER, food and stomach acid sometimes move back up into the esophagus. You may have a disease called gastroesophageal reflux disease (GERD) if the reflux: Happens often. Causes frequent or very bad symptoms. Causes problems such as damage to the esophagus. When this happens, the esophagus becomes sore and swollen. Over time, GERD can make small holes (ulcers) in the lining of the esophagus. What are the causes? This condition is caused by a problem with the muscle between the esophagus and the stomach. When this muscle is weak or not normal, it does not close properly to keep food and acid from coming back up from the stomach. The muscle can be weak because of: Tobacco use. Pregnancy. Having a certain type of hernia (hiatal hernia). Alcohol use. Certain foods and drinks, such as coffee, chocolate, onions, and peppermint. What increases the risk? Being overweight. Having a disease that affects your connective tissue. Taking NSAIDs, such a ibuprofen. What are the signs or symptoms? Heartburn. Difficult or painful swallowing. The feeling of having a lump in the throat. A bitter taste in the mouth. Bad breath. Having a lot of saliva. Having an upset or bloated stomach. Burping. Chest pain. Different conditions can cause chest pain. Make sure you see your doctor if you have chest pain. Shortness of breath or wheezing. A long-term cough or a cough at night. Wearing away of the  surface of teeth (tooth enamel). Weight loss. How is this treated? Making changes to your diet. Taking medicine. Having surgery. Treatment will depend on how bad your symptoms are. Follow these instructions at home: Eating and drinking  Follow a diet as told by your doctor. You may need to avoid foods and drinks such as: Coffee and tea, with or without caffeine. Drinks that contain alcohol. Energy drinks and sports drinks. Bubbly (carbonated) drinks or sodas. Chocolate and cocoa. Peppermint and mint flavorings. Garlic and onions. Horseradish. Spicy and acidic foods. These include peppers, chili powder, curry powder, vinegar, hot sauces, and BBQ sauce. Citrus fruit juices and citrus fruits, such as oranges, lemons, and limes. Tomato-based foods. These include red sauce, chili, salsa, and pizza with red sauce. Fried and fatty foods. These include donuts, french fries, potato chips, and high-fat dressings. High-fat meats. These include hot dogs, rib eye steak, sausage, ham, and bacon. High-fat dairy items, such as whole milk, butter, and cream cheese. Eat small meals often. Avoid eating large meals. Avoid drinking large amounts of liquid with your meals. Avoid eating meals during the 2-3 hours before bedtime. Avoid lying down right after you eat. Do not exercise right after you eat. Lifestyle  Do not smoke or use any products that contain nicotine or tobacco. If you need help quitting, ask your doctor. Try to lower your stress. If you need help doing this, ask your doctor. If you are overweight, lose an amount of weight that is healthy for you. Ask your doctor about a safe weight loss goal. General instructions Pay  attention to any changes in your symptoms. Take over-the-counter and prescription medicines only as told by your doctor. Do not take aspirin, ibuprofen, or other NSAIDs unless your doctor says it is okay. Wear loose clothes. Do not wear anything tight around your  waist. Raise (elevate) the head of your bed about 6 inches (15 cm). You may need to use a wedge to do this. Avoid bending over if this makes your symptoms worse. Keep all follow-up visits. Contact a doctor if: You have new symptoms. You lose weight and you do not know why. You have trouble swallowing or it hurts to swallow. You have wheezing or a cough that keeps happening. You have a hoarse voice. Your symptoms do not get better with treatment. Get help right away if: You have sudden pain in your arms, neck, jaw, teeth, or back. You suddenly feel sweaty, dizzy, or light-headed. You have chest pain or shortness of breath. You vomit and the vomit is green, yellow, or black, or it looks like blood or coffee grounds. You faint. Your poop (stool) is red, bloody, or black. You cannot swallow, drink, or eat. These symptoms may represent a serious problem that is an emergency. Do not wait to see if the symptoms will go away. Get medical help right away. Call your local emergency services (911 in the [place] Do not drive yourself to the hospital. Summary If a person has gastroesophageal reflux disease (GERD), food and stomach acid move back up into the esophagus and cause symptoms or problems such as damage to the esophagus. Treatment will depend on how bad your symptoms are. Follow a diet as told by your doctor. Take all medicines only as told by your doctor. This information is not intended to replace advice given to you by your health care provider. Make sure you discuss any questions you have with your health care provider. Document Revised: 09/29/2019 Document Reviewed: 09/29/2019 Elsevier Patient Education  2024 [employer].

## 2022-10-10 ENCOUNTER — Encounter: Payer: Self-pay | Admitting: Surgery

## 2022-10-10 NOTE — Progress Notes (Signed)
Outpatient Surgical Follow Up  10/10/2022  [name] is an 62 y.o. female.   Chief Complaint  Patient presents with   Follow-up    HPI: Maria Richardson is a 62 y.o. female following for GERD and H. hernia.  She does have a history of COPD that is advanced and prior history of pneumonia related to COVID. She  completed short course of prednisone  Prior hx of appendectomy and ovarian removal Pfannenstiel incision SHe did have a CT chest personally reviewed showing evidence of a small sliding hernia with gallstones as well , emphysematous changes on lung parenchyma. Barium swallow also pers reviewed w HH and GERD, no strictures   S He also reports significant chronic cough and severe GERD despite PPI.  BC and CMP normal. She cancel her EGD appt and never completed it. She reports her GERD and cough sxs improved and now they have come back again.  Past Medical History:  Diagnosis Date   Asthma    COPD (chronic obstructive pulmonary disease) (HCC)    Eczema    Hypoglycemia    Hypothyroidism    Shortness of breath dyspnea    because of asthma   Wears dentures    full lower    Past Surgical History:  Procedure Laterality Date   APPENDECTOMY     BREAST BIOPSY Left 01/10/2022   [place] Core Bx 5:00 Ribbon clip path pending   COLONOSCOPY N/A 03/11/2021   Procedure: COLONOSCOPY;  Surgeon: Midge Minium, MD;  Location: [facility];  Service: Endoscopy;  Laterality: N/A;   COLONOSCOPY WITH PROPOFOL N/A 07/23/2015   Procedure: COLONOSCOPY WITH PROPOFOL;  Surgeon: Midge Minium, MD;  Location: [facility];  Service: Endoscopy;  Laterality: N/A;  PLEASE LEAVE PT AT LATER AM    OOPHORECTOMY     as teenager   POLYPECTOMY  07/23/2015   Procedure: POLYPECTOMY;  Surgeon: Midge Minium, MD;  Location: [facility];  Service: Endoscopy;;   POLYPECTOMY  03/11/2021   Procedure: POLYPECTOMY;  Surgeon: Midge Minium, MD;  Location: [facility];  Service: Endoscopy;;     Family History  Problem Relation Age of Onset   Diabetes Father    Hypertension Father    Kidney disease Father    Colon cancer Father     Social History:  reports that she quit smoking about 24 years ago. Her smoking use included cigarettes. She has a 15.00 pack-year smoking history. She has never used smokeless tobacco. She reports that she does not drink alcohol and does not use drugs.  Allergies:  Allergies  Allergen Reactions   Codeine Nausea And Vomiting    Medications reviewed.    ROS Full ROS performed and is otherwise negative other than what is stated in HPI   BP 132/77   Pulse (!) 59   Temp 98 F (36.7 C)   Ht 5' (1.524 m)   Wt 143 lb (64.9 kg)   SpO2 97%   BMI 27.93 kg/m   Physical Exam CONSTITUTIONAL: NAD. EYES: Pupils are equal, round, and reactive to light, Sclera are non-icteric. EARS, NOSE, MOUTH AND THROAT: The oropharynx is clear. The oral mucosa is pink and moist. Hearing is intact to voice. LYMPH NODES:  Lymph nodes in the neck are normal. RESPIRATORY:  Lungs are clear. There is normal respiratory effort, with equal breath sounds bilaterally, and without pathologic use of accessory muscles. CARDIOVASCULAR: Heart is regular without murmurs, gallops, or rubs. GI: The abdomen is  soft, nontender, and  nondistended. There are no palpable masses. There is no hepatosplenomegaly. There are normal bowel sounds. GU: Rectal deferred.   MUSCULOSKELETAL: Normal muscle strength and tone. No cyanosis or edema.   SKIN: Turgor is good and there are no pathologic skin lesions or ulcers. NEUROLOGIC: Motor and sensation is grossly normal. Cranial nerves are grossly intact. PSYCH:  Oriented to person, place and time. Affect is normal.  Assessment/Plan: 62 yo w GERD and sliding hernia and chronic pulm. Sxs. D/W the pt in detail about her disease process. I do think  that hiatal hernia is symptomatic and contributing to her chronic pulm sxs.  She needs to complete  EGD and we will re reqUest GI appt.. I will see her back once she completes her w/u. We did talk about potential for [facility] repair robotically, expected outcomes , recover ey, risks, benefits and possible complications  She seems interested in potential surgical intervention I spent 40 minutes in this encounter including personally reviewing imaging studies, coordinating her care, placing orders and performing appropriate documentation  Sterling Big, MD [facility] General Surgeon

## 2022-10-11 ENCOUNTER — Telehealth: Payer: Self-pay | Admitting: Gastroenterology

## 2022-10-11 NOTE — Telephone Encounter (Signed)
Patient called in to schedule her upper endoscopy for assessment of hiatal hernia.

## 2022-10-11 NOTE — Telephone Encounter (Signed)
Patient had a referral placed from general surgery for Hiatal hernia. Please advise if patient needs appointment or can a EGD just be scheduled

## 2022-10-13 ENCOUNTER — Other Ambulatory Visit: Payer: Self-pay

## 2022-10-13 DIAGNOSIS — R1310 Dysphagia, unspecified: Secondary | ICD-10-CM

## 2022-10-17 NOTE — Telephone Encounter (Signed)
Left message on voicemail to see if pt wanted to move procedure to this Fri

## 2022-10-19 ENCOUNTER — Encounter: Payer: Self-pay | Admitting: Gastroenterology

## 2022-10-20 ENCOUNTER — Telehealth: Payer: Self-pay

## 2022-10-20 NOTE — Telephone Encounter (Signed)
Called and left a message to reschedule EGD with patient due to sytem being down. Called and left  a message for call back

## 2022-10-20 NOTE — Telephone Encounter (Signed)
Patient was very upset she could not have her procedure today she states she is not eating and is coughing non stop. She states that she has to have this EGD ASAP. Reschedule patient to 12/01/2022 in [place] called trish and she will move patient to the DEPO and Selena Batten from [place] will pick her up.  Sent kim a message to move. Sent out new instructions to patient   Please let patient know if there is any cancellation sooner

## 2022-10-26 NOTE — Telephone Encounter (Signed)
Pt has been moved to 8/5 in [place] PPW released to Mychart and mailed to pt

## 2022-10-27 ENCOUNTER — Encounter: Payer: Self-pay | Admitting: Gastroenterology

## 2022-10-30 ENCOUNTER — Other Ambulatory Visit: Payer: Self-pay | Admitting: Family Medicine

## 2022-10-30 DIAGNOSIS — E039 Hypothyroidism, unspecified: Secondary | ICD-10-CM

## 2022-10-30 NOTE — Telephone Encounter (Signed)
Called pt to inform her to come and do her labs. Pt has questions about other meds being mixed with her thyroid meds and she is concerned her #'s wont be right. Please call pt back

## 2022-11-01 NOTE — Telephone Encounter (Signed)
sPOKE WITH PATIENT AND SHE VERBALIZED UNDERSTANDING AND EXPRESSED COMPLIANCE.

## 2022-11-02 ENCOUNTER — Other Ambulatory Visit: Payer: Self-pay

## 2022-11-02 DIAGNOSIS — E039 Hypothyroidism, unspecified: Secondary | ICD-10-CM

## 2022-11-03 ENCOUNTER — Other Ambulatory Visit: Payer: Self-pay | Admitting: Family Medicine

## 2022-11-03 ENCOUNTER — Other Ambulatory Visit: Payer: Self-pay

## 2022-11-03 DIAGNOSIS — E039 Hypothyroidism, unspecified: Secondary | ICD-10-CM

## 2022-11-06 ENCOUNTER — Ambulatory Visit: Payer: 59 | Admitting: Anesthesiology

## 2022-11-06 ENCOUNTER — Encounter
Admission: RE | Disposition: A | Discharge status: Home / Self Care | Payer: Self-pay | Source: Home / Self Care | Attending: Gastroenterology

## 2022-11-06 ENCOUNTER — Ambulatory Visit
Admission: RE | Admit: 2022-11-06 | Discharge: 2022-11-06 | Disposition: A | Discharge status: Home / Self Care | Payer: 59 | Source: Home / Self Care | Attending: Gastroenterology | Admitting: Gastroenterology

## 2022-11-06 ENCOUNTER — Other Ambulatory Visit: Payer: Self-pay

## 2022-11-06 ENCOUNTER — Encounter: Payer: Self-pay | Admitting: Gastroenterology

## 2022-11-06 DIAGNOSIS — K449 Diaphragmatic hernia without obstruction or gangrene: Secondary | ICD-10-CM | POA: Insufficient documentation

## 2022-11-06 DIAGNOSIS — Z87891 Personal history of nicotine dependence: Secondary | ICD-10-CM | POA: Insufficient documentation

## 2022-11-06 DIAGNOSIS — J4489 Other specified chronic obstructive pulmonary disease: Secondary | ICD-10-CM | POA: Diagnosis not present

## 2022-11-06 DIAGNOSIS — R12 Heartburn: Secondary | ICD-10-CM | POA: Diagnosis present

## 2022-11-06 DIAGNOSIS — K219 Gastro-esophageal reflux disease without esophagitis: Secondary | ICD-10-CM

## 2022-11-06 DIAGNOSIS — R1310 Dysphagia, unspecified: Secondary | ICD-10-CM

## 2022-11-06 HISTORY — PX: ESOPHAGOGASTRODUODENOSCOPY (EGD) WITH PROPOFOL: SHX5813

## 2022-11-06 SURGERY — ESOPHAGOGASTRODUODENOSCOPY (EGD) WITH PROPOFOL
Anesthesia: General

## 2022-11-06 MED ORDER — SODIUM CHLORIDE 0.9 % IV SOLN: INTRAVENOUS | Status: DC

## 2022-11-06 MED ORDER — LIDOCAINE HCL (CARDIAC) PF 100 MG/5ML IV SOSY
PREFILLED_SYRINGE | INTRAVENOUS | Status: DC | PRN
  Administered 2022-11-06: 60 mg via INTRAVENOUS

## 2022-11-06 MED ORDER — PROPOFOL 10 MG/ML IV BOLUS
INTRAVENOUS | Status: DC | PRN
  Administered 2022-11-06: 90 mg via INTRAVENOUS
  Administered 2022-11-06: 30 mg via INTRAVENOUS

## 2022-11-06 MED ORDER — LACTATED RINGERS IV SOLN: INTRAVENOUS | Status: DC

## 2022-11-06 SURGICAL SUPPLY — 32 items

## 2022-11-06 NOTE — Anesthesia Preprocedure Evaluation (Signed)
Anesthesia Evaluation  Patient identified by MRN, date of birth, ID band Patient awake    Reviewed: Allergy & Precautions, NPO status , Patient's Chart, lab work & pertinent test results  History of Anesthesia Complications Negative for: history of anesthetic complications  Airway Mallampati: III  TM Distance: >3 FB Neck ROM: full    Dental  (+) Chipped, Poor Dentition, Missing   Pulmonary shortness of breath and with exertion, asthma , COPD, former smoker   Pulmonary exam normal        Cardiovascular Exercise Tolerance: Good (-) angina Normal cardiovascular exam     Neuro/Psych  Neuromuscular disease  negative psych ROS   GI/Hepatic negative GI ROS, Neg liver ROS,neg GERD  ,,  Endo/Other  Hypothyroidism    Renal/GU negative Renal ROS  negative genitourinary   Musculoskeletal   Abdominal   Peds  Hematology negative hematology ROS (+)   Anesthesia Other Findings Patient reports that they do not think that any food or pills are stuck in their throat at this time.  Past Medical History: No date: Asthma No date: COPD (chronic obstructive pulmonary disease) (HCC) No date: Eczema No date: Hypoglycemia No date: Hypothyroidism No date: Shortness of breath dyspnea     Comment:  because of asthma No date: Wears dentures     Comment:  full lower  Past Surgical History: No date: APPENDECTOMY 01/10/2022: BREAST BIOPSY; Left     Comment:  [place] Core Bx 5:00 Ribbon clip path pending 03/11/2021: COLONOSCOPY; N/A     Comment:  Procedure: COLONOSCOPY;  Surgeon: Midge Minium, MD;                Location: [facility];  Service: Endoscopy;                Laterality: N/A; 07/23/2015: COLONOSCOPY WITH PROPOFOL; N/A     Comment:  Procedure: COLONOSCOPY WITH PROPOFOL;  Surgeon: Midge Minium, MD;  Location: [facility];  Service:               Endoscopy;  Laterality: N/A;  PLEASE LEAVE PT AT LATER                AM  No date: OOPHORECTOMY     Comment:  as teenager 07/23/2015: POLYPECTOMY     Comment:  Procedure: POLYPECTOMY;  Surgeon: Midge Minium, MD;                Location: [facility];  Service: Endoscopy;; 03/11/2021: POLYPECTOMY     Comment:  Procedure: POLYPECTOMY;  Surgeon: Midge Minium, MD;                Location: [facility];  Service: Endoscopy;;  BMI    Body Mass Index: 27.73 kg/m      Reproductive/Obstetrics negative OB ROS                             Anesthesia Physical Anesthesia Plan  ASA: 3  Anesthesia Plan: General   Post-op Pain Management:    Induction: Intravenous  PONV Risk Score and Plan: Propofol infusion and TIVA  Airway Management Planned: Natural Airway and Nasal Cannula  Additional Equipment:   Intra-op Plan:   Post-operative Plan:   Informed Consent: I have reviewed the patients History and Physical, chart, labs and discussed the procedure including the risks, benefits and alternatives for the  proposed anesthesia with the patient or authorized representative who has indicated his/her understanding and acceptance.     Dental Advisory Given  Plan Discussed with: Anesthesiologist, CRNA and Surgeon  Anesthesia Plan Comments: (Patient consented for risks of anesthesia including but not limited to:  - adverse reactions to medications - risk of airway placement if required - damage to eyes, teeth, lips or other oral mucosa - nerve damage due to positioning  - sore throat or hoarseness - Damage to heart, brain, nerves, lungs, other parts of body or loss of life  Patient voiced understanding.)       Anesthesia Quick Evaluation

## 2022-11-06 NOTE — Anesthesia Postprocedure Evaluation (Addendum)
Anesthesia Post Note  Patient: Maria Richardson  Procedure(s) Performed: ESOPHAGOGASTRODUODENOSCOPY (EGD) WITH PROPOFOL  Patient location during evaluation: PACU Level of consciousness: awake and alert Pain management: pain level controlled Vital Signs Assessment: post-procedure vital signs reviewed and stable Respiratory status: spontaneous breathing, nonlabored ventilation, respiratory function stable and patient connected to nasal cannula oxygen Cardiovascular status: blood pressure returned to baseline and stable Postop Assessment: no apparent nausea or vomiting Anesthetic complications: no   No notable events documented.   Last Vitals:  Vitals:   11/06/22 0731 11/06/22 0739  BP: (!) 89/62 103/73  Pulse: 76   Resp: (!) 22   Temp: 36.6 C (!) 36.4 C  SpO2: 93% 96%    Last Pain:  Vitals:   11/06/22 0739  TempSrc:   PainSc: 0-No pain                 Cleda Mccreedy 

## 2022-11-06 NOTE — Op Note (Signed)
[facility] Patient Name: Maria Richardson Procedure Date: 11/06/2022 7:08 AM MRN: [id number] Account #: [id number] Date of Birth: 1960-08-25 Admit Type: Outpatient Age: 62 Room: [facility] OR ROOM 01 Gender: Female Note Status: Finalized Instrument Name: [id number] Procedure:             Upper GI endoscopy Indications:           Heartburn Providers:             Midge Minium MD, MD Referring MD:          Onnie Boer. Sowles, MD (Referring MD) Medicines:             Propofol per Anesthesia Complications:         No immediate complications. Procedure:             Pre-Anesthesia Assessment:                        - Prior to the procedure, a History and Physical was                         performed, and patient medications and allergies were                         reviewed. The patient's tolerance of previous                         anesthesia was also reviewed. The risks and benefits                         of the procedure and the sedation options and risks                         were discussed with the patient. All questions were                         answered, and informed consent was obtained. Prior                         Anticoagulants: The patient has taken no anticoagulant                         or antiplatelet agents. ASA Grade Assessment: II - A                         patient with mild systemic disease. After reviewing                         the risks and benefits, the patient was deemed in                         satisfactory condition to undergo the procedure.                        After obtaining informed consent, the endoscope was                         passed under direct vision. Throughout the procedure,  the patient's blood pressure, pulse, and oxygen                         saturations were monitored continuously. The Endoscope                         was introduced through the mouth, and advanced to the                          second part of duodenum. The upper GI endoscopy was                         accomplished without difficulty. The patient tolerated                         the procedure well. Findings:      A 4 cm hiatal hernia was present.      The stomach was normal.      The examined duodenum was normal. Impression:            - 4 cm hiatal hernia.                        - Normal stomach.                        - Normal examined duodenum.                        - No specimens collected. Recommendation:        - Discharge patient to home.                        - Resume previous diet.                        - Continue present medications.                        - Return to referring physician as previously                         scheduled. Procedure Code(s):     --- Professional ---                        [phone], Esophagogastroduodenoscopy, flexible,                         transoral; diagnostic, including collection of                         specimen(s) by brushing or washing, when performed                         (separate procedure) Diagnosis Code(s):     --- Professional ---                        R12, Heartburn CPT copyright 2022 American Medical Association. All rights reserved. The codes documented in this report are preliminary and upon coder review may  be revised to meet current compliance requirements. Midge Minium MD,  MD 11/06/2022 7:29:20 AM This report has been signed electronically. Number of Addenda: 0 Note Initiated On: 11/06/2022 7:08 AM Total Procedure Duration: 0 hours 4 minutes 4 seconds  Estimated Blood Loss:  Estimated blood loss: none.      [facility]

## 2022-11-06 NOTE — Addendum Note (Signed)
Addendum  created 11/06/22 0807 by , Cleda Mccreedy, MD   Attestation recorded in Intraprocedure, Clinical Note Signed, Intraprocedure Attestations filed

## 2022-11-06 NOTE — Telephone Encounter (Signed)
Requested Prescriptions  Refused Prescriptions Disp Refills   levothyroxine (SYNTHROID) 137 MCG tablet [Pharmacy Med Name: LEVOTHYROXINE 0.137MG  ( ) TAB] 64 tablet     Sig: TAKE 1 TABLET BY MOUTH EVERY DAY BEFORE BREAKFAST MON-FRI     Endocrinology:  Hypothyroid Agents Failed - 11/03/2022  6:23 PM      Failed - TSH in normal range and within 360 days    TSH  Date Value Ref Range Status  11/02/2022 7.480 (H) 0.450 - 4.500 uIU/mL Final         Passed - Valid encounter within last 12 months    Recent Outpatient Visits           4 months ago Other emphysema [name])   [name] Alba Cory, MD   5 months ago Viral upper respiratory tract infection   [name] Berniece Salines, FNP   12 months ago Atherosclerosis of aorta [name]   [name] Alba Cory, MD   1 year ago Screening for colon cancer   [name] Caro Laroche, DO   2 years ago COVID-19   [name] Caro Laroche, DO       Future Appointments             In 2 months Alba Cory, MD [name], [name]

## 2022-11-06 NOTE — H&P (Signed)
Midge Minium, MD [facility] [address], [address] [place], [place] [address] Phone:[phone] Fax : [phone]  Primary Care Physician:  Alba Cory, MD Primary Gastroenterologist:  Dr. Servando Snare  Pre-Procedure History & Physical: HPI:  Maria Richardson is a 62 y.o. female is here for an endoscopy.   Past Medical History:  Diagnosis Date   Asthma    COPD (chronic obstructive pulmonary disease) (HCC)    Eczema    Hypoglycemia    Hypothyroidism    Shortness of breath dyspnea    because of asthma   Wears dentures    full lower    Past Surgical History:  Procedure Laterality Date   APPENDECTOMY     BREAST BIOPSY Left 01/10/2022   [place] Core Bx 5:00 Ribbon clip path pending   COLONOSCOPY N/A 03/11/2021   Procedure: COLONOSCOPY;  Surgeon: Midge Minium, MD;  Location: [facility];  Service: Endoscopy;  Laterality: N/A;   COLONOSCOPY WITH PROPOFOL N/A 07/23/2015   Procedure: COLONOSCOPY WITH PROPOFOL;  Surgeon: Midge Minium, MD;  Location: [facility];  Service: Endoscopy;  Laterality: N/A;  PLEASE LEAVE PT AT LATER AM    OOPHORECTOMY     as teenager   POLYPECTOMY  07/23/2015   Procedure: POLYPECTOMY;  Surgeon: Midge Minium, MD;  Location: [facility];  Service: Endoscopy;;   POLYPECTOMY  03/11/2021   Procedure: POLYPECTOMY;  Surgeon: Midge Minium, MD;  Location: [facility];  Service: Endoscopy;;    Prior to Admission medications   Medication Sig Start Date End Date Taking? Authorizing Provider  Fluticasone-Umeclidin-Vilant (TRELEGY ELLIPTA) 100-62.5-25 MCG/INH AEPB Take 1 puff by mouth daily. 06/30/20  Yes Sowles, Danna Hefty, MD  levothyroxine (SYNTHROID) 137 MCG tablet TAKE 1 TABLET BY MOUTH DAILY BEFORE BREAKFAST MON-FRI 11/03/22  Yes Sowles, Danna Hefty, MD  pantoprazole (PROTONIX) 40 MG tablet Take 40 mg by mouth daily. Patient not taking: Reported on 10/27/2022    [provider]  triamcinolone ointment (KENALOG) 0.5 % Apply 1 application  topically 2 (two) times daily. Patient not taking: Reported on 10/27/2022 06/30/20   Alba Cory, MD    Allergies as of 10/13/2022 - Review Complete 10/10/2022  Allergen Reaction Noted   Codeine Nausea And Vomiting 07/15/2014    Family History  Problem Relation Age of Onset   Diabetes Father    Hypertension Father    Kidney disease Father    Colon cancer Father     Social History   Socioeconomic History   Marital status: Married    Spouse name: Richard    Number of children: 0   Years of education: Not on file   Highest education level: 12th grade  Occupational History   Occupation: Location manager: [employer]  Tobacco Use   Smoking status: Former    Current packs/day: 0.00    Average packs/day: 0.5 packs/day for 30.0 years (15.0 ttl pk-yrs)    Types: Cigarettes    Start date: 04/27/1968    Quit date: 04/27/1998    Years since quitting: 24.5   Smokeless tobacco: Never  Vaping Use   Vaping status: Never Used  Substance and Sexual Activity   Alcohol use: No    Alcohol/week: 0.0 standard drinks of alcohol   Drug use: No   Sexual activity: Not Currently    Comment: no desire  Other Topics Concern   Not on file  Social History Narrative   Husband has a history of alcohol use and sometimes drug use and she gets  scared of getting exposed to STI and sometimes she is scared when he is under the influence.    Never had children - but has step children and step grandchildren    Raised her cousin, still lives at home   Social Determinants of Health   Financial Resource Strain: Low Risk  (12/12/2019)   Overall Financial Resource Strain (CARDIA)    Difficulty of Paying Living Expenses: Not hard at all  Food Insecurity: No Food Insecurity (12/12/2019)   Hunger Vital Sign    Worried About Running Out of Food in the Last Year: Never true    Ran Out of Food in the Last Year: Never true  Transportation Needs: No Transportation Needs (12/12/2019)   PRAPARE - Therapist, art (Medical): No    Lack of Transportation (Non-Medical): No  Physical Activity: Inactive (12/12/2019)   Exercise Vital Sign    Days of Exercise per Week: 0 days    Minutes of Exercise per Session: 0 min  Stress: No Stress Concern Present (12/12/2019)   [employer] of Occupational Health - Occupational Stress Questionnaire    Feeling of Stress : Not at all  Social Connections: Socially Integrated (12/12/2019)   Social Connection and Isolation Panel [NHANES]    Frequency of Communication with Friends and Family: More than three times a week    Frequency of Social Gatherings with Friends and Family: More than three times a week    Attends Religious Services: 1 to 4 times per year    Active Member of [employer] or Organizations: No    Attends Banker Meetings: 1 to 4 times per year    Marital Status: Married  Catering manager Violence: Not At Risk (12/12/2019)   Humiliation, Afraid, Rape, and Kick questionnaire    Fear of Current or Ex-Partner: No    Emotionally Abused: No    Physically Abused: No    Sexually Abused: No    Review of Systems: See HPI, otherwise negative ROS  Physical Exam: BP 126/69   Pulse 70   Temp 98.1 F (36.7 C) (Temporal)   Resp 19   Ht 5' (1.524 m)   Wt 64.4 kg   SpO2 100%   BMI 27.73 kg/m  General:   Alert,  pleasant and cooperative in NAD Head:  Normocephalic and atraumatic. Neck:  Supple; no masses or thyromegaly. Lungs:  Clear throughout to auscultation.    Heart:  Regular rate and rhythm. Abdomen:  Soft, nontender and nondistended. Normal bowel sounds, without guarding, and without rebound.   Neurologic:  Alert and  oriented x4;  grossly normal neurologically.  Impression/Plan: Maria Richardson is here for an endoscopy to be performed for GERD  Risks, benefits, limitations, and alternatives regarding  endoscopy have been reviewed with the patient.  Questions have been answered.  All parties  agreeable.   Midge Minium, MD  11/06/2022, 7:13 AM

## 2022-11-06 NOTE — Transfer of Care (Signed)
Immediate Anesthesia Transfer of Care Note  Patient: Maria Richardson  Procedure(s) Performed: ESOPHAGOGASTRODUODENOSCOPY (EGD) WITH PROPOFOL  Patient Location: PACU  Anesthesia Type: General  Level of Consciousness: awake, alert  and patient cooperative  Airway and Oxygen Therapy: Patient Spontanous Breathing and Patient connected to supplemental oxygen  Post-op Assessment: Post-op Vital signs reviewed, Patient's Cardiovascular Status Stable, Respiratory Function Stable, Patent Airway and No signs of Nausea or vomiting  Post-op Vital Signs: Reviewed and stable  Complications: No notable events documented.

## 2022-11-07 ENCOUNTER — Encounter: Payer: Self-pay | Admitting: Gastroenterology

## 2022-12-15 ENCOUNTER — Telehealth: Payer: Self-pay | Admitting: Family Medicine

## 2022-12-15 NOTE — Telephone Encounter (Signed)
Pt is calling to request if lab orders paper can be waiting for her. Pt reports that she will pick this up on her lunch and does not have a lot of time to wait around. Please advise CB- [phone]

## 2022-12-18 ENCOUNTER — Other Ambulatory Visit: Payer: Self-pay

## 2022-12-18 DIAGNOSIS — E039 Hypothyroidism, unspecified: Secondary | ICD-10-CM

## 2022-12-19 LAB — TSH: TSH: 0.092 u[IU]/mL — ABNORMAL LOW (ref 0.450–4.500)

## 2022-12-20 ENCOUNTER — Other Ambulatory Visit: Payer: Self-pay | Admitting: Family Medicine

## 2022-12-20 ENCOUNTER — Other Ambulatory Visit: Payer: Self-pay

## 2022-12-20 DIAGNOSIS — E039 Hypothyroidism, unspecified: Secondary | ICD-10-CM

## 2022-12-20 MED ORDER — LEVOTHYROXINE SODIUM 137 MCG PO TABS
137.0000 ug | ORAL_TABLET | Freq: Every day | ORAL | 1 refills | Status: DC
Start: 2022-12-20 — End: 2023-04-02

## 2023-01-05 NOTE — Progress Notes (Deleted)
Name: Maria Richardson   MRN: [id number]    DOB: 06-05-1960   Date:01/05/2023       Progress Note  Subjective  Chief Complaint  Follow Up  HPI  Adult hypothyroidism: she has been taking levothyroxine 150 mcg M-Fridays, she forgets to take it on weekends - she has been consistent since last visit . She denies hair loss, no change in bowel movements, denies constipation    Pre-diabetes: father had a history of  DM, her hgbA1C was up to 6.5 %  back in 08/2010 but is down to 5.9 % today , she is avoiding sweets, she was having  fatigue about 30 minutes after a meal, but since she changed her diet and avoiding sodas symptoms have improved, we will recheck A1C  COPD/Emphysema Asthma/hiatal hernia : under the care of Dr. Creed Copper, she used to smoke 25 years ago, she had worsening of cough last Fall and CT chest was done 04/2021, that showed hiatal hernia, emphysema and was given a PPI ( she has stopped taking it ) , she has been  taking Trelegy daily for a while and seems to help. She still has a cough but no wheezing or SOB   Vulva lesion: she saw Dr. 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 sclerosis but she states no longer having pruritis , she was advised to go back in 3 months but she lost to follow up, she needs to go back for GYN, she states she will stop by today to schedule a visit   Eczema: she has sensitivity to metals and usually has oubreaks around her neck due to necklace she still has a refill of topical medication at home   Atherosclerosis of aorta: discussed starting her on statin therapy , she is willing to try it   Hiatal hernia: doing well, GERD symptoms improved with life style modification   Patient Active Problem List   Diagnosis Date Noted   Gastroesophageal reflux disease without esophagitis 11/06/2022   Atherosclerosis of aorta (HCC) 11/11/2021   History of colonic polyps    Polyp of ascending colon    Other specified diseases of intestine     Cervical radiculitis 11/11/2019   Pulmonary emphysema (HCC)    History of COVID-19 04/2019   Dyslipidemia 08/04/2015   Benign neoplasm of sigmoid colon    Seasonal allergic rhinitis 01/07/2015   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   History of anemia 01/07/2015   Excess weight 01/07/2015   Pre-diabetes 01/07/2015   Leiomyoma of uterus 01/07/2015   Calculus of gallbladder 01/07/2015   Adult hypothyroidism 09/24/2009   COPD with asthma (HCC) 05/18/2009   Vitamin D deficiency 05/18/2009    Past Surgical History:  Procedure Laterality Date   APPENDECTOMY     BREAST BIOPSY Left 01/10/2022   [place] Core Bx 5:00 Ribbon clip path pending   COLONOSCOPY N/A 03/11/2021   Procedure: COLONOSCOPY;  Surgeon: Midge Minium, MD;  Location: [facility];  Service: Endoscopy;  Laterality: N/A;   COLONOSCOPY WITH PROPOFOL N/A 07/23/2015   Procedure: COLONOSCOPY WITH PROPOFOL;  Surgeon: Midge Minium, MD;  Location: [facility];  Service: Endoscopy;  Laterality: N/A;  PLEASE LEAVE PT AT LATER AM    ESOPHAGOGASTRODUODENOSCOPY (EGD) WITH PROPOFOL N/A 11/06/2022   Procedure: ESOPHAGOGASTRODUODENOSCOPY (EGD) WITH PROPOFOL;  Surgeon: Midge Minium, MD;  Location: [facility];  Service: Endoscopy;  Laterality: N/A;   OOPHORECTOMY     as teenager   POLYPECTOMY  07/23/2015   Procedure: POLYPECTOMY;  Surgeon: Midge Minium, MD;  Location: [facility];  Service: Endoscopy;;   POLYPECTOMY  03/11/2021   Procedure: POLYPECTOMY;  Surgeon: Midge Minium, MD;  Location: [facility];  Service: Endoscopy;;    Family History  Problem Relation Age of Onset   Diabetes Father    Hypertension Father    Kidney disease Father    Colon cancer Father     Social History   Tobacco Use   Smoking status: Former    Current packs/day: 0.00    Average packs/day: 0.5 packs/day for 30.0 years (15.0 ttl pk-yrs)    Types: Cigarettes    Start date: 04/27/1968    Quit date: 04/27/1998    Years since  quitting: 24.7   Smokeless tobacco: Never  Substance Use Topics   Alcohol use: No    Alcohol/week: 0.0 standard drinks of alcohol     Current Outpatient Medications:    Fluticasone-Umeclidin-Vilant (TRELEGY ELLIPTA) 100-62.5-25 MCG/INH AEPB, Take 1 puff by mouth daily., Disp: 180 each, Rfl: 1   levothyroxine (SYNTHROID) 137 MCG tablet, Take 1 tablet (137 mcg total) by mouth daily before breakfast. M-F skip weekends, recheck in 6 weeks, Disp: 30 tablet, Rfl: 1   pantoprazole (PROTONIX) 40 MG tablet, Take 40 mg by mouth daily. (Patient not taking: Reported on 10/27/2022), Disp: , Rfl:    triamcinolone ointment (KENALOG) 0.5 %, Apply 1 application topically 2 (two) times daily. (Patient not taking: Reported on 10/27/2022), Disp: 60 g, Rfl: 0  Allergies  Allergen Reactions   Codeine Nausea And Vomiting    I personally reviewed active problem list, medication list, allergies, family history, social history, health maintenance with the patient/caregiver today.   ROS  ***  Objective  There were no vitals filed for this visit.  There is no height or weight on file to calculate BMI.  Physical Exam ***  Recent Results (from the past 2160 hour(s))  TSH+T4F+T3Free     Status: Abnormal   Collection Time: 11/02/22 10:50 AM  Result Value Ref Range   TSH 7.480 (H) 0.450 - 4.500 uIU/mL   T3, Free 2.3 2.0 - 4.4 pg/mL   Free T4 1.11 0.82 - 1.77 ng/dL  TSH     Status: Abnormal   Collection Time: 12/18/22  9:02 AM  Result Value Ref Range   TSH 0.092 (L) 0.450 - 4.500 uIU/mL    PHQ2/9:    06/28/2022   11:50 AM 06/05/2022    8:45 AM 11/11/2021    4:11 PM 11/11/2021    4:01 PM 02/07/2021   10:31 AM  Depression screen PHQ 2/9  Decreased Interest 0 0 0 0 0  Down, Depressed, Hopeless 0 0 0 0 0  PHQ - 2 Score 0 0 0 0 0  Altered sleeping 0  0 0 0  Tired, decreased energy 0  0 0 0  Change in appetite 0  0 0 0  Feeling bad or failure about yourself  0  0 0 0  Trouble concentrating 0  0 0 0   Moving slowly or fidgety/restless 0  0 0 0  Suicidal thoughts 0  0 0 0  PHQ-9 Score 0  0 0 0  Difficult doing work/chores   Not difficult at all  Not difficult at all    phq 9 is {gen pos [id number]}   Fall Risk:    06/28/2022   11:50 AM 06/05/2022    8:45 AM 11/11/2021    4:01 PM 02/07/2021   10:31 AM 09/27/2020  1:48 PM  Fall Risk   Falls in the past year? 0 0 0 0 0  Number falls in past yr:  0  0 0  Injury with Fall?  0  0 0  Risk for fall due to : No Fall Risks  No Fall Risks    Follow up Falls prevention discussed  Falls prevention discussed  Falls evaluation completed      Functional Status Survey:      Assessment & Plan  *** There are no diagnoses linked to this encounter.

## 2023-01-08 ENCOUNTER — Ambulatory Visit: Payer: 59 | Admitting: Family Medicine

## 2023-01-19 NOTE — Progress Notes (Deleted)
Name: Maria Richardson   MRN: [id number]    DOB: Dec 21, 1960   Date:01/19/2023       Progress Note  Subjective  Chief Complaint  Follow Up  HPI  Adult hypothyroidism: she has been taking levothyroxine 150 mcg M-Fridays, she forgets to take it on weekends - she has been consistent since last visit . She denies hair loss, no change in bowel movements, denies constipation    Pre-diabetes: father had a history of  DM, her hgbA1C was up to 6.5 %  back in 08/2010 but is down to 5.9 % today , she is avoiding sweets, she was having  fatigue about 30 minutes after a meal, but since she changed her diet and avoiding sodas symptoms have improved, we will recheck A1C  COPD/Emphysema Asthma/hiatal hernia : under the care of Dr. Creed Copper, she used to smoke 25 years ago, she had worsening of cough last Fall and CT chest was done 04/2021, that showed hiatal hernia, emphysema and was given a PPI ( she has stopped taking it ) , she has been  taking Trelegy daily for a while and seems to help. She still has a cough but no wheezing or SOB   Vulva lesion: she saw Dr. 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 sclerosis but she states no longer having pruritis , she was advised to go back in 3 months but she lost to follow up, she needs to go back for GYN, she states she will stop by today to schedule a visit   Eczema: she has sensitivity to metals and usually has oubreaks around her neck due to necklace she still has a refill of topical medication at home   Atherosclerosis of aorta: discussed starting her on statin therapy , she is willing to try it   Hiatal hernia: doing well, GERD symptoms improved with life style modification   Patient Active Problem List   Diagnosis Date Noted  . Gastroesophageal reflux disease without esophagitis 11/06/2022  . Atherosclerosis of aorta (HCC) 11/11/2021  . History of colonic polyps   . Polyp of ascending colon   . Other specified diseases of  intestine   . Cervical radiculitis 11/11/2019  . Pulmonary emphysema (HCC)   . History of COVID-19 04/2019  . Dyslipidemia 08/04/2015  . Benign neoplasm of sigmoid colon   . Seasonal allergic rhinitis 01/07/2015  . 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 History of anemia 01/07/2015  . Excess weight 01/07/2015  . Pre-diabetes 01/07/2015  . Leiomyoma of uterus 01/07/2015  . Calculus of gallbladder 01/07/2015  . Adult hypothyroidism 09/24/2009  . COPD with asthma (HCC) 05/18/2009  . Vitamin D deficiency 05/18/2009    Past Surgical History:  Procedure Laterality Date  . APPENDECTOMY    . BREAST BIOPSY Left 01/10/2022   [place] Core Bx 5:00 Ribbon clip path pending  . COLONOSCOPY N/A 03/11/2021   Procedure: COLONOSCOPY;  Surgeon: Midge Minium, MD;  Location: [facility];  Service: Endoscopy;  Laterality: N/A;  . COLONOSCOPY WITH PROPOFOL N/A 07/23/2015   Procedure: COLONOSCOPY WITH PROPOFOL;  Surgeon: Midge Minium, MD;  Location: [facility];  Service: Endoscopy;  Laterality: N/A;  PLEASE LEAVE PT AT LATER AM   . ESOPHAGOGASTRODUODENOSCOPY (EGD) WITH PROPOFOL N/A 11/06/2022   Procedure: ESOPHAGOGASTRODUODENOSCOPY (EGD) WITH PROPOFOL;  Surgeon: Midge Minium, MD;  Location: [facility];  Service: Endoscopy;  Laterality: N/A;  . OOPHORECTOMY     as teenager  . POLYPECTOMY  07/23/2015   Procedure: POLYPECTOMY;  Surgeon: Midge Minium, MD;  Location: [facility];  Service: Endoscopy;;  . POLYPECTOMY  03/11/2021   Procedure: POLYPECTOMY;  Surgeon: Midge Minium, MD;  Location: [facility];  Service: Endoscopy;;    Family History  Problem Relation Age of Onset  . Diabetes Father   . Hypertension Father   . Kidney disease Father   . Colon cancer Father     Social History   Tobacco Use  . Smoking status: Former    Current packs/day: 0.00    Average packs/day: 0.5 packs/day for 30.0 years (15.0 ttl pk-yrs)    Types: Cigarettes    Start date: 04/27/1968    Quit  date: 04/27/1998    Years since quitting: 24.7  . Smokeless tobacco: Never  Substance Use Topics  . Alcohol use: No    Alcohol/week: 0.0 standard drinks of alcohol     Current Outpatient Medications:  .  Fluticasone-Umeclidin-Vilant (TRELEGY ELLIPTA) 100-62.5-25 MCG/INH AEPB, Take 1 puff by mouth daily., Disp: 180 each, Rfl: 1 .  levothyroxine (SYNTHROID) 137 MCG tablet, Take 1 tablet (137 mcg total) by mouth daily before breakfast. M-F skip weekends, recheck in 6 weeks, Disp: 30 tablet, Rfl: 1 .  pantoprazole (PROTONIX) 40 MG tablet, Take 40 mg by mouth daily. (Patient not taking: Reported on 10/27/2022), Disp: , Rfl:  .  triamcinolone ointment (KENALOG) 0.5 %, Apply 1 application topically 2 (two) times daily. (Patient not taking: Reported on 10/27/2022), Disp: 60 g, Rfl: 0  Allergies  Allergen Reactions  . Codeine Nausea And Vomiting    I personally reviewed active problem list, medication list, allergies, family history, social history, health maintenance with the patient/caregiver today.   ROS  ***  Objective  There were no vitals filed for this visit.  There is no height or weight on file to calculate BMI.  Physical Exam ***  Recent Results (from the past 2160 hour(s))  TSH+T4F+T3Free     Status: Abnormal   Collection Time: 11/02/22 10:50 AM  Result Value Ref Range   TSH 7.480 (H) 0.450 - 4.500 uIU/mL   T3, Free 2.3 2.0 - 4.4 pg/mL   Free T4 1.11 0.82 - 1.77 ng/dL  TSH     Status: Abnormal   Collection Time: 12/18/22  9:02 AM  Result Value Ref Range   TSH 0.092 (L) 0.450 - 4.500 uIU/mL    PHQ2/9:    06/28/2022   11:50 AM 06/05/2022    8:45 AM 11/11/2021    4:11 PM 11/11/2021    4:01 PM 02/07/2021   10:31 AM  Depression screen PHQ 2/9  Decreased Interest 0 0 0 0 0  Down, Depressed, Hopeless 0 0 0 0 0  PHQ - 2 Score 0 0 0 0 0  Altered sleeping 0  0 0 0  Tired, decreased energy 0  0 0 0  Change in appetite 0  0 0 0  Feeling bad or failure about yourself  0  0  0 0  Trouble concentrating 0  0 0 0  Moving slowly or fidgety/restless 0  0 0 0  Suicidal thoughts 0  0 0 0  PHQ-9 Score 0  0 0 0  Difficult doing work/chores   Not difficult at all  Not difficult at all    phq 9 is {gen pos [id number]}   Fall Risk:    06/28/2022   11:50 AM 06/05/2022    8:45 AM 11/11/2021    4:01 PM 02/07/2021   10:31 AM 09/27/2020  1:48 PM  Fall Risk   Falls in the past year? 0 0 0 0 0  Number falls in past yr:  0  0 0  Injury with Fall?  0  0 0  Risk for fall due to : No Fall Risks  No Fall Risks    Follow up Falls prevention discussed  Falls prevention discussed  Falls evaluation completed      Functional Status Survey:      Assessment & Plan  *** There are no diagnoses linked to this encounter.

## 2023-01-22 ENCOUNTER — Ambulatory Visit: Payer: 59 | Admitting: Family Medicine

## 2023-01-24 ENCOUNTER — Ambulatory Visit: Payer: 59 | Admitting: Surgery

## 2023-03-14 ENCOUNTER — Ambulatory Visit: Payer: 59 | Admitting: Family Medicine

## 2023-03-16 NOTE — Progress Notes (Unsigned)
Name: Maria Richardson   MRN: [id number]    DOB: 1960-04-16   Date:03/20/2023       Progress Note  Subjective  Chief Complaint  Chief Complaint  Patient presents with   Medical Management of Chronic Issues    HPI Discussed the use of AI scribe software for clinical note transcription with the patient, who gave verbal consent to proceed.  Discussed the use of AI scribe software for clinical note transcription with the patient, who gave verbal consent to proceed.  History of Present Illness   The patient, with a history of acid reflux and a small hiatal hernia, reports a significant change in her eating habits due to discomfort and coughing when eating certain foods. She has lost a significant amount of weight due to these dietary changes. Despite the discomfort, the patient is hesitant to have the hernia removed due to the major nature of the surgery.  The patient also has aortic atherosclerosis, identified on a recent CT scan, which is being managed with cholesterol medication. She has made significant dietary changes to manage her acid reflux, including avoiding greasy foods, seasonings, and salt.  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 She has been diagnosed with COPD Grade 2B and emphysematous changes on the CT, she has a personal history of smoking about half to one pack daily for about 20 years, currently seeing pulmonologist and is taking  Trelegy.  The patient also has a history of thyroid fluctuations, sometimes skipping her medication, which has resulted in inconsistent thyroid levels. She is currently taking her thyroid medication five days a week, skipping the weekends 137 mcg daily dose.  Lastly, the patient has been experiencing a chronic cough since 2023. Despite these health challenges, the patient reports feeling much better and has been able to maintain  an active lifestyle without significant fatigue or shortness of breath.        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   History of anemia 01/07/2015   Excess weight 01/07/2015   Pre-diabetes 01/07/2015   Leiomyoma of uterus 01/07/2015   Calculus of gallbladder 01/07/2015   Adult hypothyroidism 09/24/2009   COPD with asthma (HCC) 05/18/2009   Vitamin D deficiency 05/18/2009    Past Surgical History:  Procedure Laterality Date   APPENDECTOMY     BREAST BIOPSY Left 01/10/2022   [place] Core Bx 5:00 Ribbon clip path pending   COLONOSCOPY N/A 03/11/2021   Procedure: COLONOSCOPY;  Surgeon: Midge Minium, MD;  Location: [facility];  Service: Endoscopy;  Laterality: N/A;   COLONOSCOPY WITH PROPOFOL N/A 07/23/2015   Procedure: COLONOSCOPY WITH PROPOFOL;  Surgeon: Midge Minium, MD;  Location: [facility];  Service: Endoscopy;  Laterality: N/A;  PLEASE LEAVE PT AT LATER AM    ESOPHAGOGASTRODUODENOSCOPY (EGD) WITH PROPOFOL N/A 11/06/2022   Procedure: ESOPHAGOGASTRODUODENOSCOPY (EGD) WITH PROPOFOL;  Surgeon: Midge Minium, MD;  Location: [facility];  Service: Endoscopy;  Laterality: N/A;  OOPHORECTOMY     as teenager   POLYPECTOMY  07/23/2015   Procedure: POLYPECTOMY;  Surgeon: Midge Minium, MD;  Location: [facility];  Service: Endoscopy;;   POLYPECTOMY  03/11/2021   Procedure: POLYPECTOMY;  Surgeon: Midge Minium, MD;  Location: [facility];  Service: Endoscopy;;    Family History  Problem Relation Age of Onset    Diabetes Father    Hypertension Father    Kidney disease Father    Colon cancer Father     Social History   Tobacco Use   Smoking status: Former    Current packs/day: 0.00    Average packs/day: 0.5 packs/day for 30.0 years (15.0 ttl pk-yrs)    Types: Cigarettes    Start date: 04/27/1968    Quit date: 04/27/1998    Years since quitting: 24.9   Smokeless tobacco: Never  Substance Use Topics   Alcohol use: No    Alcohol/week: 0.0 standard drinks of alcohol     Current Outpatient Medications:    Fluticasone-Umeclidin-Vilant (TRELEGY ELLIPTA) 100-62.5-25 MCG/INH AEPB, Take 1 puff by mouth daily., Disp: 180 each, Rfl: 1   levothyroxine (SYNTHROID) 137 MCG tablet, Take 1 tablet (137 mcg total) by mouth daily before breakfast. M-F skip weekends, recheck in 6 weeks, Disp: 30 tablet, Rfl: 1   pantoprazole (PROTONIX) 40 MG tablet, Take 40 mg by mouth daily. (Patient not taking: Reported on 10/27/2022), Disp: , Rfl:    triamcinolone ointment (KENALOG) 0.5 %, Apply 1 application topically 2 (two) times daily. (Patient not taking: Reported on 10/27/2022), Disp: 60 g, Rfl: 0  Allergies  Allergen Reactions   Codeine Nausea And Vomiting    I personally reviewed active problem list, medication list, allergies, family history with the patient/caregiver today.   ROS  Ten systems reviewed and is negative except as mentioned in HPI    Objective  Vitals:   03/20/23 1513  BP: 118/72  Pulse: 81  Resp: 16  Temp: 97.6 F (36.4 C)  TempSrc: Oral  SpO2: 91%  Weight: 140 lb 14.4 oz (63.9 kg)  Height: 5' (1.524 m)    Body mass index is 27.52 kg/m.  Physical Exam  Constitutional: Patient appears well-developed and well-nourished. No distress.  HEENT: head atraumatic, normocephalic, pupils equal and reactive to light, neck supple Cardiovascular: Normal rate, regular rhythm and normal heart sounds.  No murmur heard. No BLE edema. Pulmonary/Chest: Effort normal and breath sounds normal. No  respiratory distress. Abdominal: Soft.  There is no tenderness. Psychiatric: Patient has a normal mood and affect. behavior is normal. Judgment and thought content normal.   PHQ2/9:    03/20/2023    3:12 PM 06/28/2022   11:50 AM 06/05/2022    8:45 AM 11/11/2021    4:11 PM 11/11/2021    4:01 PM  Depression screen PHQ 2/9  Decreased Interest 0 0 0 0 0  Down, Depressed, Hopeless 0 0 0 0 0  PHQ - 2 Score 0 0 0 0 0  Altered sleeping 0 0  0 0  Tired, decreased energy 0 0  0 0  Change in appetite 0 0  0 0  Feeling bad or failure about yourself  0 0  0 0  Trouble concentrating 0 0  0 0  Moving slowly or fidgety/restless 0 0  0 0  Suicidal thoughts 0 0  0 0  PHQ-9 Score 0 0  0 0  Difficult doing work/chores Not difficult at all   Not difficult at all  phq 9 is negative   Fall Risk:    03/20/2023    3:12 PM 06/28/2022   11:50 AM 06/05/2022    8:45 AM 11/11/2021    4:01 PM 02/07/2021   10:31 AM  Fall Risk   Falls in the past year? 0 0 0 0 0  Number falls in past yr: 0  0  0  Injury with Fall? 0  0  0  Risk for fall due to : No Fall Risks No Fall Risks  No Fall Risks   Follow up Falls prevention discussed;Education provided;Falls evaluation completed Falls prevention discussed  Falls prevention discussed      Assessment & Plan  Assessment and Plan    Hiatal Hernia Symptoms of acid reflux and coughing due to hiatal hernia. Patient has made dietary changes to manage symptoms and is not currently interested in surgical intervention. -Continue dietary modifications to manage symptoms. -Continue follow-up with gastroenterologist and surgeon as needed.  Aortic Atherosclerosis Plaque identified in the aorta on CT scan. Patient is not currently on cholesterol medication. -Consider starting cholesterol medication, such as rosuvastatin, to manage plaque buildup. -Check cholesterol levels at next lab visit.  Chronic Obstructive Pulmonary Disease (COPD) Patient has a history of smoking  and has been diagnosed with COPD. Currently managed with Trelegy. -Continue Trelegy as prescribed. -Follow-up with pulmonologist as scheduled.  Hypothyroidism Inconsistent use of thyroid medication leading to fluctuating thyroid levels. Patient agrees to take medication consistently five days a week. -Continue levothyroxine five days a week. -Check thyroid levels at next lab visit.  Vitamin D Deficiency Previous low levels of Vitamin D identified. -Start Vitamin D supplement 2000 units daily.  Hyperlipidemia Previous high cholesterol levels identified, but patient is not currently on cholesterol medication. -Check cholesterol levels at next lab visit. -Discuss potential cholesterol medication based on lab results.  General Health Maintenance / Followup Plans -Check A1C, liver function, and cholesterol at next lab visit. -Follow-up in six months or sooner if needed.

## 2023-03-20 ENCOUNTER — Ambulatory Visit (INDEPENDENT_AMBULATORY_CARE_PROVIDER_SITE_OTHER): Payer: 59 | Admitting: Family Medicine

## 2023-03-20 ENCOUNTER — Encounter: Payer: Self-pay | Admitting: Family Medicine

## 2023-03-20 VITALS — BP 118/72 | HR 81 | Temp 97.6°F | Resp 16 | Ht 60.0 in | Wt 140.9 lb

## 2023-03-20 DIAGNOSIS — I700 Atherosclerosis of aorta: Secondary | ICD-10-CM | POA: Diagnosis not present

## 2023-03-20 DIAGNOSIS — R748 Abnormal levels of other serum enzymes: Secondary | ICD-10-CM

## 2023-03-20 DIAGNOSIS — E039 Hypothyroidism, unspecified: Secondary | ICD-10-CM | POA: Diagnosis not present

## 2023-03-20 DIAGNOSIS — R7303 Prediabetes: Secondary | ICD-10-CM

## 2023-03-20 DIAGNOSIS — Z79899 Other long term (current) drug therapy: Secondary | ICD-10-CM

## 2023-03-20 DIAGNOSIS — J438 Other emphysema: Secondary | ICD-10-CM

## 2023-03-20 DIAGNOSIS — K449 Diaphragmatic hernia without obstruction or gangrene: Secondary | ICD-10-CM

## 2023-03-20 DIAGNOSIS — K219 Gastro-esophageal reflux disease without esophagitis: Secondary | ICD-10-CM

## 2023-03-30 ENCOUNTER — Other Ambulatory Visit: Payer: Self-pay | Admitting: Family Medicine

## 2023-03-31 LAB — COMPREHENSIVE METABOLIC PANEL
ALT: 62 [IU]/L — ABNORMAL HIGH (ref 0–32)
AST: 73 [IU]/L — ABNORMAL HIGH (ref 0–40)
Albumin: 4.1 g/dL (ref 3.9–4.9)
Alkaline Phosphatase: 115 [IU]/L (ref 44–121)
BUN/Creatinine Ratio: 21 (ref 12–28)
BUN: 19 mg/dL (ref 8–27)
Bilirubin Total: 0.3 mg/dL (ref 0.0–1.2)
CO2: 22 mmol/L (ref 20–29)
Calcium: 9.8 mg/dL (ref 8.7–10.3)
Chloride: 105 mmol/L (ref 96–106)
Creatinine, Ser: 0.91 mg/dL (ref 0.57–1.00)
Globulin, Total: 2.6 g/dL (ref 1.5–4.5)
Glucose: 90 mg/dL (ref 70–99)
Potassium: 4.2 mmol/L (ref 3.5–5.2)
Sodium: 141 mmol/L (ref 134–144)
Total Protein: 6.7 g/dL (ref 6.0–8.5)
eGFR: 72 mL/min/{1.73_m2} (ref >59)

## 2023-03-31 LAB — LIPID PANEL
Chol/HDL Ratio: 3.1 {ratio} (ref 0.0–4.4)
Cholesterol, Total: 171 mg/dL (ref 100–199)
HDL: 55 mg/dL (ref >39)
LDL Chol Calc (NIH): 108 mg/dL — ABNORMAL HIGH (ref 0–99)
Triglycerides: 39 mg/dL (ref 0–149)
VLDL Cholesterol Cal: 8 mg/dL (ref 5–40)

## 2023-03-31 LAB — TSH: TSH: 0.042 u[IU]/mL — ABNORMAL LOW (ref 0.450–4.500)

## 2023-04-02 ENCOUNTER — Other Ambulatory Visit: Payer: Self-pay | Admitting: Family Medicine

## 2023-04-02 MED ORDER — LEVOTHYROXINE SODIUM 125 MCG PO TABS: 125.0000 ug | ORAL_TABLET | Freq: Every day | ORAL | 0 refills | Status: DC

## 2023-04-05 ENCOUNTER — Other Ambulatory Visit: Payer: Self-pay

## 2023-04-05 NOTE — Telephone Encounter (Unsigned)
 Copied from CRM [id number]. Topic: Referral - Request for Referral >> Apr 05, 2023 11:11 AM Edsel HERO wrote: Did the patient discuss referral with their provider in the last year? Yes (If No - schedule appointment) (If Yes - send message)  Appointment offered? No  Type of order/referral and detailed reason for visit: Gastroenterology  Preference of office, provider, location: Dr. Jinny  If referral order, have you been seen by this specialty before? No (If Yes, this issue or another issue? When? Where?  Can we respond through MyChart? Yes

## 2023-04-09 ENCOUNTER — Telehealth: Payer: Self-pay

## 2023-04-09 NOTE — Telephone Encounter (Signed)
 Patient called and states PCP informed her to call and make appointment with Dr. Jinny for elevated LFT. Informed patient that Dr. Jinny is not seeing patient in the office right now just doing hospital call and procedures. Informed her I would make a appointment with our PA. She states this is getting expensive because every time she comes it is 80 dollars. She might find another GI doctor. She made appointment for 05/15/2023

## 2023-04-18 ENCOUNTER — Other Ambulatory Visit: Payer: Self-pay | Admitting: Pulmonary Disease

## 2023-04-18 DIAGNOSIS — J849 Interstitial pulmonary disease, unspecified: Secondary | ICD-10-CM

## 2023-04-23 ENCOUNTER — Ambulatory Visit
Admission: RE | Admit: 2023-04-23 | Discharge: 2023-04-23 | Disposition: A | Discharge status: Home / Self Care | Payer: 59 | Source: Ambulatory Visit | Attending: Pulmonary Disease | Admitting: Pulmonary Disease

## 2023-04-23 DIAGNOSIS — J849 Interstitial pulmonary disease, unspecified: Secondary | ICD-10-CM | POA: Insufficient documentation

## 2023-04-25 ENCOUNTER — Ambulatory Visit: Payer: 59 | Admitting: Family Medicine

## 2023-05-14 NOTE — Progress Notes (Signed)
Maria Amy, PA-C [address]  [address]  [place], [place] [address]  Main: [phone]  Fax: [phone]   Gastroenterology Consultation  Referring Provider:     Alba Cory, MD Primary Care Physician:  Alba Cory, MD Primary Gastroenterologist:  Maria Amy, PA-C / Dr. Midge Minium   Reason for Consultation:     Elevated LFTs        HPI:   Maria Richardson is a 63 y.o. y/o female referred for consultation & management  by Alba Cory, MD. Here to evaluate elevated liver transaminases.    She has not drank any alcohol since 1997.  Quit smoking in 1998.  She denies any GI symptoms such as abdominal pain, heartburn, diarrhea, constipation, abdominal swelling or extremity edema.  No jaundice.  She has had 15 pound weight loss in the past 2 years, attributed to healthy diet.  She has been trying to lose weight.  Eats low fat.  No family history of liver disease.  No new medications.  03/2023 labs: AST 73, ALT 62.  Normal bilirubin and alk phos.  Liver transaminases have been up-and-down since 2021.  Ferritin has been moderately elevated (483 - 659), however total iron and iron saturation have been normal.  Alpha-1 antitrypsin test is normal.  Negative acute viral hepatitis A, B, and C labs.  Not immune to hepatitis B.  CT abdomen pelvis with contrast 09/2022 showed normal liver.  Small 5 gallstones noted.  11/2022 EGD by Dr. Servando Snare: 4 cm hiatal hernia, normal stomach and duodenum.  No biopsies.  03/2021 colonoscopy: 1 small tubular adenoma and 1 small hyperplastic polyp removed.   Moderate active proctosigmoid colitis.  7-year repeat.  07/2015 colonoscopy: 1 small tubular adenoma polyp removed.   Past Medical History:  Diagnosis Date   Asthma    COPD (chronic obstructive pulmonary disease) (HCC)    Eczema    Hypoglycemia    Hypothyroidism    Shortness of breath dyspnea    because of asthma   Wears dentures    full lower    Past Surgical History:  Procedure  Laterality Date   APPENDECTOMY     BREAST BIOPSY Left 01/10/2022   [place] Core Bx 5:00 Ribbon clip path pending   COLONOSCOPY N/A 03/11/2021   Procedure: COLONOSCOPY;  Surgeon: Midge Minium, MD;  Location: [facility];  Service: Endoscopy;  Laterality: N/A;   COLONOSCOPY WITH PROPOFOL N/A 07/23/2015   Procedure: COLONOSCOPY WITH PROPOFOL;  Surgeon: Midge Minium, MD;  Location: [facility];  Service: Endoscopy;  Laterality: N/A;  PLEASE LEAVE PT AT LATER AM    ESOPHAGOGASTRODUODENOSCOPY (EGD) WITH PROPOFOL N/A 11/06/2022   Procedure: ESOPHAGOGASTRODUODENOSCOPY (EGD) WITH PROPOFOL;  Surgeon: Midge Minium, MD;  Location: [facility];  Service: Endoscopy;  Laterality: N/A;   OOPHORECTOMY     as teenager   POLYPECTOMY  07/23/2015   Procedure: POLYPECTOMY;  Surgeon: Midge Minium, MD;  Location: [facility];  Service: Endoscopy;;   POLYPECTOMY  03/11/2021   Procedure: POLYPECTOMY;  Surgeon: Midge Minium, MD;  Location: [facility];  Service: Endoscopy;;    Prior to Admission medications   Medication Sig Start Date End Date Taking? Authorizing Provider  Fluticasone-Umeclidin-Vilant (TRELEGY ELLIPTA) 100-62.5-25 MCG/INH AEPB Take 1 puff by mouth daily. 06/30/20   Alba Cory, MD  levothyroxine (SYNTHROID) 125 MCG tablet Take 1 tablet (125 mcg total) by mouth daily before breakfast. M-F only skip weekends, recheck TSH in 6 weeks 04/02/23   Alba Cory,  MD  pantoprazole (PROTONIX) 40 MG tablet Take 40 mg by mouth daily. Patient not taking: Reported on 10/27/2022    [provider]  triamcinolone ointment (KENALOG) 0.5 % Apply 1 application topically 2 (two) times daily. Patient not taking: Reported on 10/27/2022 06/30/20   Alba Cory, MD    Family History  Problem Relation Age of Onset   Diabetes Father    Hypertension Father    Kidney disease Father    Colon cancer Father      Social History   Tobacco Use   Smoking status: Former     Current packs/day: 0.00    Average packs/day: 0.5 packs/day for 30.0 years (15.0 ttl pk-yrs)    Types: Cigarettes    Start date: 04/27/1968    Quit date: 04/27/1998    Years since quitting: 25.0   Smokeless tobacco: Never  Vaping Use   Vaping status: Never Used  Substance Use Topics   Alcohol use: No    Alcohol/week: 0.0 standard drinks of alcohol   Drug use: No    Allergies as of 05/15/2023 - Review Complete 05/15/2023  Allergen Reaction Noted   Codeine Nausea And Vomiting 07/15/2014    Review of Systems:    All systems reviewed and negative except where noted in HPI.   Physical Exam:  BP 118/67   Pulse 66   Temp 97.7 F (36.5 C)   Ht 5' (1.524 m)   Wt 142 lb (64.4 kg)   BMI 27.73 kg/m  No LMP recorded. Patient is postmenopausal.  General:   Alert,  Well-developed, well-nourished, pleasant and cooperative in NAD Lungs:  Respirations even and unlabored.  Clear throughout to auscultation.   No wheezes, crackles, or rhonchi. No acute distress. Heart:  Regular rate and rhythm; no murmurs, clicks, rubs, or gallops. Abdomen:  Normal bowel sounds.  No bruits.  Soft, and non-distended without masses, hepatosplenomegaly or hernias noted.  No Tenderness.  No guarding or rebound tenderness.    Neurologic:  Alert and oriented x3;  grossly normal neurologically. Psych:  Alert and cooperative. Normal mood and affect.  Imaging Studies: CT Chest High Resolution Result Date: 04/30/2023 CLINICAL DATA:  Interstitial lung disease, COPD.  Former smoker EXAM: CT CHEST WITHOUT CONTRAST TECHNIQUE: Multidetector CT imaging of the chest was performed following the standard protocol without intravenous contrast. High resolution imaging of the lungs, as well as inspiratory and expiratory imaging, was performed. RADIATION DOSE REDUCTION: This exam was performed according to the departmental dose-optimization program which includes automated exposure control, adjustment of the mA and/or kV according to  patient size and/or use of iterative reconstruction technique. COMPARISON:  04/21/2021, 05/19/2019. FINDINGS: Cardiovascular: Atherosclerotic calcification of the aorta. Enlarged pulmonic trunk. Heart is at the upper limits of normal in size to mildly enlarged. No pericardial effusion. Mediastinum/Nodes: No pathologically enlarged mediastinal or axillary lymph nodes. Hilar regions are difficult to definitively evaluate without IV contrast. Esophagus is grossly unremarkable. Lungs/Pleura: Centrilobular emphysema. Scattered pulmonary parenchymal scarring. Negative for subpleural reticulation, traction bronchiectasis/bronchiolectasis, ground glass, architectural distortion or honeycombing. Mild cylindrical bronchiectasis. 4 mm anterior segment right upper lobe nodule (8/43), unchanged. New 4 mm right lower lobe nodule (8/77). Benign juxtapleural lymph nodes. 5 mm apical left upper lobe nodule (8/32), new. 6 mm lingular nodule (5 x 7 mm, 8/58), new. No pleural fluid. Airway is unremarkable. Expiratory phase imaging was not performed in true expiration, limiting the evaluation for air trapping. Upper Abdomen: Small hiatal hernia. Visualized portions of the liver, gallbladder, adrenal glands,  kidneys, spleen, pancreas, stomach and bowel are otherwise grossly unremarkable. No upper abdominal adenopathy. Musculoskeletal: Degenerative changes in the spine. IMPRESSION: 1. No evidence of interstitial lung disease. 2. New pulmonary nodules measure up to 6 mm in the left upper lobe. Recommend follow-up CT chest in 3-6 months in further evaluation, as patient is at increased risk for bronchogenic carcinoma. These results will be called to the ordering clinician or representative by the Radiologist Assistant, and communication documented in the PACS or [employer]. 3. Cylindrical bronchiectasis. 4.  Aortic atherosclerosis (ICD10-I70.0). 5. Enlarged pulmonic trunk, indicative of pulmonary arterial hypertension. 6.  Emphysema  (ICD10-J43.9). Electronically Signed   By: Leanna Battles M.D.   On: 04/30/2023 09:25    Assessment and Plan:   AYBREE LANYON is a 63 y.o. y/o female has been referred for   Intermittent elevated liver transaminases Cholelithiasis - appears to be asymptomatic at present. Elevated ferritin  Acute viral hepatitis A, B, and C labs, alpha-1 antitrypsin, and iron panel have been normal/negative in the past year.  Ferritin has been moderately elevated.  I am ordering more lab work and RUQ ultrasound.  Evaluate for fatty liver, autoimmune hepatitis, PBC, etc.  Plan: -RUQ ultrasound -Labs: ANA, AMA, ASMA, ceruloplasmin, viral hepatitis A/B/C,  immunoglobulins, celiac, hemochromatosis DNA-PCR.  Follow up in 4 weeks with TG.  Maria Amy, PA-C

## 2023-05-15 ENCOUNTER — Ambulatory Visit: Payer: 59 | Admitting: Physician Assistant

## 2023-05-15 ENCOUNTER — Encounter: Payer: Self-pay | Admitting: Physician Assistant

## 2023-05-15 VITALS — BP 118/67 | HR 66 | Temp 97.7°F | Ht 60.0 in | Wt 142.0 lb

## 2023-05-15 DIAGNOSIS — K8020 Calculus of gallbladder without cholecystitis without obstruction: Secondary | ICD-10-CM | POA: Diagnosis not present

## 2023-05-15 DIAGNOSIS — R7989 Other specified abnormal findings of blood chemistry: Secondary | ICD-10-CM

## 2023-05-15 DIAGNOSIS — R7401 Elevation of levels of liver transaminase levels: Secondary | ICD-10-CM | POA: Diagnosis not present

## 2023-05-15 NOTE — Patient Instructions (Signed)
Ultrasound scheduled 05/22/23 [facility] entrance arrive at 9:15 am . Nothing to eat or drink after midnight.

## 2023-05-22 ENCOUNTER — Ambulatory Visit: Payer: 59

## 2023-05-23 ENCOUNTER — Encounter: Payer: Self-pay | Admitting: Physician Assistant

## 2023-05-24 ENCOUNTER — Encounter: Payer: Self-pay | Admitting: Physician Assistant

## 2023-05-24 LAB — CERULOPLASMIN: Ceruloplasmin: 31.6 mg/dL (ref 19.0–39.0)

## 2023-05-24 LAB — HEPATITIS B SURFACE ANTIBODY,QUALITATIVE: Hep B Surface Ab, Qual: NONREACTIVE

## 2023-05-24 LAB — IRON,TIBC AND FERRITIN PANEL
Ferritin: 473 ng/mL — ABNORMAL HIGH (ref 15–150)
Iron Saturation: 26 % (ref 15–55)
Iron: 68 ug/dL (ref 27–139)
Total Iron Binding Capacity: 257 ug/dL (ref 250–450)
UIBC: 189 ug/dL (ref 118–369)

## 2023-05-24 LAB — ANA: Anti Nuclear Antibody (ANA): NEGATIVE

## 2023-05-24 LAB — MITOCHONDRIAL/SMOOTH MUSCLE AB PNL
Mitochondrial Ab: <20 U (ref 0.0–20.0)
Smooth Muscle Ab: 6 U (ref 0–19)

## 2023-05-24 LAB — CK: Total CK: 61 U/L (ref 32–182)

## 2023-05-24 LAB — ANTI-MICROSOMAL ANTIBODY LIVER / KIDNEY: LKM1 Ab: 0.7 U (ref 0.0–20.0)

## 2023-05-24 LAB — CELIAC DISEASE AB SCREEN W/RFX
Antigliadin Abs, IgA: 12 U (ref 0–19)
IgA/Immunoglobulin A, Serum: 398 mg/dL — ABNORMAL HIGH (ref 87–352)
Transglutaminase IgA: 4 U/mL — ABNORMAL HIGH (ref 0–3)

## 2023-05-24 LAB — HEMOCHROMATOSIS DNA-PCR(C282Y,H63D)

## 2023-05-24 LAB — ENDOMYSIAL IGA ANTIBODY

## 2023-05-24 LAB — HEPATITIS B CORE ANTIBODY, TOTAL: Hep B Core Total Ab: NEGATIVE

## 2023-05-24 LAB — HEPATITIS B SURFACE ANTIGEN: Hepatitis B Surface Ag: NEGATIVE

## 2023-05-24 LAB — HEPATITIS A ANTIBODY, TOTAL: hep A Total Ab: NEGATIVE

## 2023-05-25 NOTE — Progress Notes (Signed)
 DIVISION OF PULMONARY AND CRITICAL CARE MEDICINE                              FOLLOW UP ENCOUNTER     Chief complaint:  COPD with lung nodules   History of Present Illness Maria Richardson is a 63 year old female with COPD, acid reflux, hernia, and lung scarring who presents for a follow-up visit.  She has a history of chronic obstructive pulmonary disease (COPD) and centrilobular emphysema, managed with a Trelegy inhaler once daily. Recent pulmonary function tests from May 18, 2023, show improvement in lung function, with FEV1 increasing from 64% to 71% and diffusion capacity improving from 49% to 56%. She has not experienced any new symptoms and feels that her lung function has improved. She has gained two pounds recently and is focused on maintaining her weight to aid her breathing.  She has lung scarring, which she attributes to past COVID-19 infection and smoking, although she quit smoking over 20 years ago. A recent CT scan from January 2025 showed emphysema, scarring, and bronchiectasis in both lungs, with small nodules present. There are no signs of pneumonia, cancer, or fluid in or around the lungs.  She has a history of a hiatal hernia, which has been stable and is described as small. She manages her acid reflux by avoiding greasy and salty foods, which she believes has contributed to her improved lung function.  She is concerned about liver inflammation that has been monitored since 2021. She recently saw a specialist who conducted tests, including hepatitis A, B, and C, which returned normal. An ultrasound is planned to further investigate the cause of the liver inflammation. No pain is associated with this issue.   Past Medical History:   Past Medical History:  Diagnosis Date  . Asthma (HHS-HCC)   . Eczema, unspecified   . Hypoglycemia   . Hypothyroidism   . Shortness of breath    because of asthma  . Wears dentures    full lower     Past Surgical History:   Past Surgical History:  Procedure Laterality Date  . COLONOSCOPY  07/23/2015  . Polypectomy  07/23/2015  . APPENDECTOMY    . OOPHORECTOMY     as teenager    Allergies:   Allergies  Allergen Reactions  . Codeine Nausea And Vomiting    Current Medications:   Prior to Admission medications   Medication Sig Taking? Last Dose  fluticasone -umeclidinium-vilanterol (TRELEGY ELLIPTA ) 100-62.5-25 mcg inhaler Inhale 1 Puff into the lungs once daily Yes Taking  levothyroxine  (SYNTHROID ) 150 MCG tablet Take 150 mcg by mouth once daily Yes Taking  predniSONE  (DELTASONE ) 20 MG tablet Take 1 tablet (20 mg total) by mouth once daily Patient not taking: Reported on 05/25/2023  Not Taking  rosuvastatin  (CRESTOR ) 5 MG tablet Take 5 mg by mouth once daily Patient not taking: Reported on 05/25/2023  Not Taking    Family History:   Family History  Problem Relation Name Age of Onset  . Chronic kidney disease Father    . Diabetes Father    . Cirrhosis Brother      Social History:   Social History   Socioeconomic History  . Marital status: Married  . Number of children: 1  Tobacco  Use  . Smoking status: Former    Current packs/day: 0.00    Average packs/day: 1 pack/day for 20.0 years (20.0 ttl pk-yrs)    Types: Cigarettes    Start date: 48    Quit date: 59    Years since quitting: 28.1  . Smokeless tobacco: Never   Social Drivers of Corporate investment banker Strain: Low Risk  (12/12/2019)   Received from [facility], [place]   Overall Financial Resource Strain (CARDIA)   . Difficulty of Paying Living Expenses: Not hard at all  Food Insecurity: No Food Insecurity (12/12/2019)   Received from [facility], [place]   Hunger Vital Sign   . Worried About Programme researcher, broadcasting/film/video in the Last Year: Never true   . Ran Out of Food in the Last Year: Never true  Transportation Needs: No Transportation Needs (12/12/2019)   Received from [facility],  [place]   [facility] - Transportation   . Lack of Transportation (Medical): No   . Lack of Transportation (Non-Medical): No  Physical Activity: Inactive (12/12/2019)   Received from [facility], [place]   Exercise Vital Sign   . Days of Exercise per Week: 0 days   . Minutes of Exercise per Session: 0 min  Stress: No Stress Concern Present (12/12/2019)   Received from [facility]   [facility] - Occupational Stress Questionnaire   . Feeling of Stress : Not at all  Social Connections: Socially Integrated (12/12/2019)   Received from [facility], [facility]   Social Connection and Isolation Panel [NHANES]   . Frequency of Communication with Friends and Family: More than three times a week   . Frequency of Social Gatherings with Friends and Family: More than three times a week   . Attends Religious Services: 1 to 4 times per year   . Active Member of Clubs or Organizations: No   . Attends Banker Meetings: 1 to 4 times per year   . Marital Status: Married  Housing Stability: Unknown (04/16/2023)   Housing Stability Vital Sign   . Homeless in the Last Year: No    Review of Systems:   A 10 point review of systems is negative, except for the pertinent positives and negatives detailed in the HPI.  Vitals:   Vitals:   05/25/23 0903  BP: 135/83  Pulse: 76  SpO2: 95%  Weight: 65 kg (143 lb 6.4 oz)  Height: 152.4 cm (5')     Body mass index is 28.01 kg/m.  Physical Exam:  Physical Exam Vitals and nursing note reviewed.  Constitutional:      General: She is not in acute distress.    Appearance: Normal appearance. She is not ill-appearing, toxic-appearing or diaphoretic.  HENT:     Head: Normocephalic and atraumatic.     Right Ear: External ear normal.     Left Ear: External ear normal.  Eyes:     General:        Right eye: No discharge.        Left eye: No discharge.     Extraocular Movements: Extraocular movements  intact.     Pupils: Pupils are equal, round, and reactive to light.  Cardiovascular:     Rate and Rhythm: Normal rate and regular rhythm.     Pulses: Normal pulses.     Heart sounds: Normal heart sounds. No murmur heard.    No friction rub. No gallop.  Abdominal:     General: Bowel sounds are normal.  Skin:    General: Skin is warm and dry.     Capillary Refill: Capillary refill takes less than 2 seconds.  Neurological:     Mental Status: She is alert.     Lab and Imaging Results:   Results RADIOLOGY Chest CT: Emphysema on both lungs, fibrotic segments focally on both lungs, bronchiectasis, tiny nodules, no signs of pneumonia, no signs of cancer or tumors, no fluid inside or outside the lung, hiatal hernia (04/2023)  DIAGNOSTIC FEV1: 1.29 liters, 71% (05/18/2023) Diffusion capacity: 56% (05/18/2023) CT Chest High Resolution  Anatomical Region Laterality Modality  Chest -- Computed Tomography   Narrative  CLINICAL DATA:  Interstitial lung disease, COPD.  Former smoker  EXAM: CT CHEST WITHOUT CONTRAST  TECHNIQUE: Multidetector CT imaging of the chest was performed following the standard protocol without intravenous contrast. High resolution imaging of the lungs, as well as inspiratory and expiratory imaging, was performed.  RADIATION DOSE REDUCTION: This exam was performed according to the departmental dose-optimization program which includes automated exposure control, adjustment of the mA and/or kV according to patient size and/or use of iterative reconstruction technique.  COMPARISON:  04/21/2021, 05/19/2019.  FINDINGS: Cardiovascular: Atherosclerotic calcification of the aorta. Enlarged pulmonic trunk. Heart is at the upper limits of normal in size to mildly enlarged. No pericardial effusion.  Mediastinum/Nodes: No pathologically enlarged mediastinal or axillary lymph nodes. Hilar regions are difficult to definitively evaluate without IV contrast. Esophagus  is grossly unremarkable.  Lungs/Pleura: Centrilobular emphysema. Scattered pulmonary parenchymal scarring. Negative for subpleural reticulation, traction bronchiectasis/bronchiolectasis, ground glass, architectural distortion or honeycombing. Mild cylindrical bronchiectasis. 4 mm anterior segment right upper lobe nodule (8/43), unchanged. New 4 mm right lower lobe nodule (8/77). Benign juxtapleural lymph nodes. 5 mm apical left upper lobe nodule (8/32), new. 6 mm lingular nodule (5 x 7 mm, 8/58), new. No pleural fluid. Airway is unremarkable. Expiratory phase imaging was not performed in true expiration, limiting the evaluation for air trapping.  Upper Abdomen: Small hiatal hernia. Visualized portions of the liver, gallbladder, adrenal glands, kidneys, spleen, pancreas, stomach and bowel are otherwise grossly unremarkable. No upper abdominal adenopathy.  Musculoskeletal: Degenerative changes in the spine.  IMPRESSION: 1. No evidence of interstitial lung disease. 2. New pulmonary nodules measure up to 6 mm in the left upper lobe. Recommend follow-up CT chest in 3-6 months in further evaluation, as patient is at increased risk for bronchogenic carcinoma. These results will be called to the ordering clinician or representative by the Radiologist Assistant, and communication documented in the PACS or [employer]. 3. Cylindrical bronchiectasis. 4.  Aortic atherosclerosis (ICD10-I70.0). 5. Enlarged pulmonic trunk, indicative of pulmonary arterial hypertension. 6.  Emphysema (ICD10-J43.9).   Electronically Signed   By: Newell Eke M.D.   On: 04/30/2023 09:25   Assessment and Plan:   Diagnoses and all orders for this visit:  Bronchiectasis without complication (CMS/HHS-HCC)  Multiple nodules of lung  Chronic obstructive pulmonary disease, unspecified COPD type (CMS/HHS-HCC)  Centrilobular emphysema (CMS/HHS-HCC)    Assessment & Plan Chronic Obstructive  Pulmonary Disease (COPD) COPD with improved lung function (FEV1 increased from 64% to 71%). Emphasized the importance of weight management to prevent diaphragm impairment and worsening of breathing. Advised wearing a mask in dusty environments and around sick individuals to prevent infections. - Continue Trelegy once daily - Encourage physical activity - Advise weight management - Wear mask in dusty environments and around sick individuals  Centrilobular Emphysema Emphysema present  in both lungs. Continued use of inhalers. - Continue Trelegy once daily  Bronchiectasis Bronchiectasis with mucus trapping. No need for antibiotics or prednisone  at this time. Continued use of inhalers. - Continue inhalers  Pulmonary Fibrosis Scarring in both lungs, likely from past smoking and possibly COVID-19. No new findings on CT scan. Discussed the importance of regular imaging to monitor the condition. - Monitor with regular imaging plan for repeat in 1 year. Does not qualify for lung cancer screening  Lung Nodules Small nodules present, not large enough to require biopsy. No signs of pneumonia, cancer, or pleural effusion. Monitoring required to detect any changes indicating malignancy. - Repeat CT scan in 12 months  Hiatal Hernia Small hiatal hernia noted on CT scan. No immediate action required. - Monitor condition  General Health Maintenance Encouraged to maintain a healthy weight to improve lung function. - Maintain healthy weight  Follow-up - Follow-up visit in 6 months.     This note has been created using automated tools and reviewed for accuracy by provider.  Patient received an After Visit Summary

## 2023-07-19 ENCOUNTER — Other Ambulatory Visit: Payer: Self-pay

## 2023-07-19 DIAGNOSIS — E039 Hypothyroidism, unspecified: Secondary | ICD-10-CM

## 2023-07-23 ENCOUNTER — Emergency Department

## 2023-07-23 ENCOUNTER — Other Ambulatory Visit: Payer: Self-pay

## 2023-07-23 ENCOUNTER — Emergency Department
Admission: EM | Admit: 2023-07-23 | Discharge: 2023-07-23 | Disposition: A | Discharge status: Home / Self Care | Attending: Emergency Medicine | Admitting: Emergency Medicine

## 2023-07-23 DIAGNOSIS — E039 Hypothyroidism, unspecified: Secondary | ICD-10-CM | POA: Insufficient documentation

## 2023-07-23 DIAGNOSIS — R7401 Elevation of levels of liver transaminase levels: Secondary | ICD-10-CM | POA: Insufficient documentation

## 2023-07-23 DIAGNOSIS — K8020 Calculus of gallbladder without cholecystitis without obstruction: Secondary | ICD-10-CM | POA: Insufficient documentation

## 2023-07-23 DIAGNOSIS — J449 Chronic obstructive pulmonary disease, unspecified: Secondary | ICD-10-CM | POA: Insufficient documentation

## 2023-07-23 DIAGNOSIS — R112 Nausea with vomiting, unspecified: Secondary | ICD-10-CM | POA: Diagnosis present

## 2023-07-23 LAB — COMPREHENSIVE METABOLIC PANEL WITH GFR
ALT: 127 U/L — ABNORMAL HIGH (ref 0–44)
AST: 175 U/L — ABNORMAL HIGH (ref 15–41)
Albumin: 3.8 g/dL (ref 3.5–5.0)
Alkaline Phosphatase: 100 U/L (ref 38–126)
Anion gap: 9 (ref 5–15)
BUN: 23 mg/dL (ref 8–23)
CO2: 23 mmol/L (ref 22–32)
Calcium: 9.5 mg/dL (ref 8.9–10.3)
Chloride: 108 mmol/L (ref 98–111)
Creatinine, Ser: 0.87 mg/dL (ref 0.44–1.00)
GFR, Estimated: >60 mL/min (ref >60)
Glucose, Bld: 133 mg/dL — ABNORMAL HIGH (ref 70–99)
Potassium: 4.2 mmol/L (ref 3.5–5.1)
Sodium: 140 mmol/L (ref 135–145)
Total Bilirubin: 0.6 mg/dL (ref 0.0–1.2)
Total Protein: 7.5 g/dL (ref 6.5–8.1)

## 2023-07-23 LAB — URINALYSIS, ROUTINE W REFLEX MICROSCOPIC
Bacteria, UA: NONE SEEN
Bilirubin Urine: NEGATIVE
Glucose, UA: NEGATIVE mg/dL
Ketones, ur: NEGATIVE mg/dL
Leukocytes,Ua: NEGATIVE
Nitrite: NEGATIVE
Protein, ur: NEGATIVE mg/dL
Specific Gravity, Urine: 1.021 (ref 1.005–1.030)
pH: 5 (ref 5.0–8.0)

## 2023-07-23 LAB — LIPASE, BLOOD: Lipase: 36 U/L (ref 11–51)

## 2023-07-23 LAB — CBC
HCT: 45.3 % (ref 36.0–46.0)
Hemoglobin: 14.1 g/dL (ref 12.0–15.0)
MCH: 29.4 pg (ref 26.0–34.0)
MCHC: 31.1 g/dL (ref 30.0–36.0)
MCV: 94.4 fL (ref 80.0–100.0)
Platelets: 199 10*3/uL (ref 150–400)
RBC: 4.8 MIL/uL (ref 3.87–5.11)
RDW: 14.6 % (ref 11.5–15.5)
WBC: 9 10*3/uL (ref 4.0–10.5)
nRBC: 0 % (ref 0.0–0.2)

## 2023-07-23 MED ORDER — ONDANSETRON 8 MG PO TBDP
8.0000 mg | ORAL_TABLET | Freq: Three times a day (TID) | ORAL | 0 refills | Status: DC | PRN
Start: 2023-07-23 — End: 2023-11-30

## 2023-07-23 MED ORDER — ONDANSETRON 8 MG PO TBDP
8.0000 mg | ORAL_TABLET | Freq: Once | ORAL | Status: AC
  Administered 2023-07-23: 8 mg via ORAL
  Filled 2023-07-23: qty 1

## 2023-07-23 NOTE — ED Triage Notes (Signed)
 Pt comes with vomiting and diarrhea. Pt states she thinks it might be something she ate last night. Pt states no belly pain.

## 2023-07-23 NOTE — ED Provider Notes (Signed)
 [facility] Provider Note   Event Date/Time   First MD Initiated Contact with Patient 07/23/23 [phone]     (approximate) History  Emesis  HPI Maria Richardson is a 63 y.o. female with stated past medical history of COPD, hypothyroidism, and GERD who presents complaining of nausea and vomiting since 0230 last night.  Patient states that this may be due to bad food that she ate the previous evening.  Patient states she is having mild epigastric pain at this time.  Patient denies any recent travel or sick contacts. ROS: Patient currently denies any vision changes, tinnitus, difficulty speaking, facial droop, sore throat, chest pain, shortness of breath, diarrhea, dysuria, or weakness/numbness/paresthesias in any extremity   Physical Exam  Triage Vital Signs: ED Triage Vitals  Encounter Vitals Group     BP 07/23/23 0853 (!) 147/79     Systolic BP Percentile --      Diastolic BP Percentile --      Pulse Rate 07/23/23 0853 92     Resp 07/23/23 0853 18     Temp 07/23/23 0853 98 F (36.7 C)     Temp src --      SpO2 07/23/23 0853 96 %     Weight 07/23/23 0851 140 lb (63.5 kg)     Height 07/23/23 0851 5' (1.524 m)     Head Circumference --      Peak Flow --      Pain Score 07/23/23 0851 4     Pain Loc --      Pain Education --      Exclude from Growth Chart --    Most recent vital signs: Vitals:   07/23/23 0853 07/23/23 1222  BP: (!) 147/79 (!) 145/81  Pulse: 92 (!) 101  Resp: 18 18  Temp: 98 F (36.7 C) 98.2 F (36.8 C)  SpO2: 96% 96%   General: Awake, oriented x4. CV:  Good peripheral perfusion.  Resp:  Normal effort.  Abd:  No distention.  Other:  Middle-aged overweight African-American female resting comfortably in no acute distress ED Results / Procedures / Treatments  Labs (all labs ordered are listed, but only abnormal results are displayed) Labs Reviewed  COMPREHENSIVE METABOLIC PANEL WITH GFR - Abnormal; Notable for the following components:       Result Value   Glucose, Bld 133 (*)    AST 175 (*)    ALT 127 (*)    All other components within normal limits  URINALYSIS, ROUTINE W REFLEX MICROSCOPIC - Abnormal; Notable for the following components:   Color, Urine YELLOW (*)    APPearance CLEAR (*)    Hgb urine dipstick MODERATE (*)    All other components within normal limits  LIPASE, BLOOD  CBC   RADIOLOGY ED MD interpretation: Right upper quadrant ultrasound independently interpreted and shows cholelithiasis without evidence of cholecystitis. -Agree with radiology assessment Official radiology report(s): No results found. PROCEDURES: Critical Care performed: No Procedures MEDICATIONS ORDERED IN ED: Medications  ondansetron  (ZOFRAN -ODT) disintegrating tablet 8 mg (8 mg Oral Given 07/23/23 0945)   IMPRESSION / MDM / ASSESSMENT AND PLAN / ED COURSE  I reviewed the triage vital signs and the nursing notes.                             The patient is on the cardiac monitor to evaluate for evidence of arrhythmia and/or significant heart rate changes. Patient's presentation is  most consistent with acute presentation with potential threat to life or bodily function. Patient presents for acute nausea/vomiting The cause of the patient's symptoms is not clear, but the patient is overall well appearing and is suspected to have a transient course of illness.  Given History and Exam there does not appear to be an emergent cause of the symptoms such as small bowel obstruction, coronary syndrome, bowel ischemia, DKA, pancreatitis, appendicitis, other acute abdomen or other emergent problem.  Reassessment: After treatment, the patient is feeling much better, tolerating PO fluids, and shows no signs of dehydration.   Disposition: Discharge home with prompt primary care physician follow up in the next 48 hours. Strict return precautions discussed.   FINAL CLINICAL IMPRESSION(S) / ED DIAGNOSES   Final diagnoses:  Nausea and  vomiting, unspecified vomiting type  Calculus of gallbladder without cholecystitis without obstruction  Transaminitis   Rx / DC Orders   ED Discharge Orders          Ordered    ondansetron  (ZOFRAN -ODT) 8 MG disintegrating tablet  Every 8 hours PRN        07/23/23 1216           Note:  This document was prepared using Dragon voice recognition software and may include unintentional dictation errors.   Taheerah Guldin K, MD 07/26/23 [phone]

## 2023-07-28 LAB — TSH: TSH: 0.377 u[IU]/mL — ABNORMAL LOW (ref 0.450–4.500)

## 2023-07-30 ENCOUNTER — Encounter: Payer: Self-pay | Admitting: Family Medicine

## 2023-07-30 ENCOUNTER — Other Ambulatory Visit: Payer: Self-pay | Admitting: Family Medicine

## 2023-07-30 MED ORDER — LEVOTHYROXINE SODIUM 112 MCG PO TABS: 112.0000 ug | ORAL_TABLET | Freq: Every day | ORAL | 0 refills | Status: DC

## 2023-09-19 ENCOUNTER — Ambulatory Visit: Payer: Self-pay | Admitting: Family Medicine

## 2023-10-24 ENCOUNTER — Ambulatory Visit: Admitting: Family Medicine

## 2023-10-24 ENCOUNTER — Encounter: Payer: Self-pay | Admitting: Family Medicine

## 2023-10-24 VITALS — BP 116/68 | HR 84 | Resp 16 | Ht 60.0 in | Wt 143.1 lb

## 2023-10-24 DIAGNOSIS — K219 Gastro-esophageal reflux disease without esophagitis: Secondary | ICD-10-CM

## 2023-10-24 DIAGNOSIS — R7303 Prediabetes: Secondary | ICD-10-CM

## 2023-10-24 DIAGNOSIS — R748 Abnormal levels of other serum enzymes: Secondary | ICD-10-CM | POA: Insufficient documentation

## 2023-10-24 DIAGNOSIS — I700 Atherosclerosis of aorta: Secondary | ICD-10-CM | POA: Diagnosis not present

## 2023-10-24 DIAGNOSIS — E559 Vitamin D deficiency, unspecified: Secondary | ICD-10-CM

## 2023-10-24 DIAGNOSIS — E039 Hypothyroidism, unspecified: Secondary | ICD-10-CM

## 2023-10-24 DIAGNOSIS — I288 Other diseases of pulmonary vessels: Secondary | ICD-10-CM | POA: Diagnosis not present

## 2023-10-24 DIAGNOSIS — J479 Bronchiectasis, uncomplicated: Secondary | ICD-10-CM | POA: Insufficient documentation

## 2023-10-24 DIAGNOSIS — J432 Centrilobular emphysema: Secondary | ICD-10-CM

## 2023-10-24 DIAGNOSIS — R0683 Snoring: Secondary | ICD-10-CM

## 2023-10-24 DIAGNOSIS — E785 Hyperlipidemia, unspecified: Secondary | ICD-10-CM

## 2023-10-24 DIAGNOSIS — K8020 Calculus of gallbladder without cholecystitis without obstruction: Secondary | ICD-10-CM

## 2023-10-24 MED ORDER — PRAVASTATIN SODIUM 10 MG PO TABS: 10.0000 mg | ORAL_TABLET | Freq: Every day | ORAL | 1 refills | Status: AC

## 2023-10-24 NOTE — Progress Notes (Signed)
 Name: Maria Richardson   MRN: [id number]    DOB: July 12, 1960   Date:10/24/2023       Progress Note  Subjective  Chief Complaint  Chief Complaint  Patient presents with   Medical Management of Chronic Issues   Discussed the use of AI scribe software for clinical note transcription with the patient, who gave verbal consent to proceed.  History of Present Illness Maria Richardson is a 63 year old female with thyroid issues and COPD who presents for a six-month follow-up visit.  She has been feeling well over the past five and a half months. She recently completed biometric testing at [facility], which included lipid and A1c tests, but not thyroid testing. She plans to send the results to her provider.  Regarding her thyroid condition, she had blood work in April showing improvement in TSH levels, which had been very low in the past. Her TSH levels were 0.092 in September, 0.042 later, and 0.374 in April, indicating a return to normal range. She attributes this improvement to a medication adjustment to 112 mcg of levothyroxine  taken Monday through Friday. No hair loss, changes in bowel movements, or swallowing difficulties.  She manages acid reflux with dietary changes following a GERD diet, resulting in significant weight loss and no current esophageal issues. She has a history of a hiatal hernia, which is not currently symptomatic due to her dietary adjustments.  She recalls undergoing extensive testing for high liver enzymes, including a CDI disease antibody test, which was weakly positive for transglutaminase, suggesting a low likelihood of celiac disease. Other tests included ferritin, hemochromatosis panel, antimicrosomal antibody, and ANA, all of which were negative. An ultrasound in April showed no lesions or fatty liver, but gallstones were present. Her liver enzymes fluctuate.  She has COPD and emphysema and is under the care of a pulmonologist. She uses Trelegy 100 mcg and reports no breathing  problems, wheezing, or shortness of breath. A CT scan in January revealed central lobular emphysema, mild cylindrical bronchiectasis, and a small hiatal hernia. She also has an enlarged pulmonary trunk and some aortic plaque.  Her A1c was previously in the prediabetes range at 6.1%, down from 6.4%. She is awaiting new results from her recent biometric testing. Her lipid panel was good at 108.    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   History of anemia 01/07/2015   Excess weight 01/07/2015   Pre-diabetes 01/07/2015   Leiomyoma of uterus 01/07/2015   Calculus of gallbladder 01/07/2015   Adult hypothyroidism 09/24/2009   COPD with asthma (HCC) 05/18/2009   Vitamin D  deficiency 05/18/2009    Past Surgical History:  Procedure Laterality Date   APPENDECTOMY     BREAST BIOPSY Left 01/10/2022   US  Core Bx 5:00 Ribbon clip path pending   COLONOSCOPY N/A 03/11/2021   Procedure: COLONOSCOPY;  Surgeon: Jinny Carmine, MD;  Location: [facility];  Service: Endoscopy;  Laterality: N/A;   COLONOSCOPY WITH PROPOFOL  N/A 07/23/2015   Procedure: COLONOSCOPY WITH PROPOFOL ;  Surgeon: Carmine Jinny, MD;  Location: [facility];  Service: Endoscopy;  Laterality: N/A;  PLEASE LEAVE PT AT LATER AM    ESOPHAGOGASTRODUODENOSCOPY (EGD) WITH PROPOFOL  N/A 11/06/2022   Procedure: ESOPHAGOGASTRODUODENOSCOPY (EGD) WITH PROPOFOL ;  Surgeon: Jinny Carmine, MD;  Location: [facility];  Service: Endoscopy;  Laterality: N/A;   OOPHORECTOMY     as teenager    POLYPECTOMY  07/23/2015   Procedure: POLYPECTOMY;  Surgeon: Carmine Jinny, MD;  Location: [facility];  Service: Endoscopy;;   POLYPECTOMY  03/11/2021   Procedure: POLYPECTOMY;  Surgeon: Jinny Carmine, MD;  Location: [facility];  Service: Endoscopy;;    Family History  Problem Relation Age of Onset   Diabetes Father    Hypertension Father    Kidney disease Father    Colon cancer Father     Social History   Tobacco Use   Smoking status: Former    Current packs/day: 0.00    Average packs/day: 0.5 packs/day for 30.0 years (15.0 ttl pk-yrs)    Types: Cigarettes    Start date: 04/27/1968    Quit date: 04/27/1998    Years since quitting: 25.5   Smokeless tobacco: Never  Substance Use Topics   Alcohol use: No    Alcohol/week: 0.0 standard drinks of alcohol     Current Outpatient Medications:    Fluticasone -Umeclidin-Vilant (TRELEGY ELLIPTA ) 100-62.5-25 MCG/INH AEPB, Take 1 puff by mouth daily., Disp: 180 each, Rfl: 1   levothyroxine  (SYNTHROID ) 112 MCG tablet, Take 1 tablet (112 mcg total) by mouth daily., Disp: 84 tablet, Rfl: 0   ondansetron  (ZOFRAN -ODT) 8 MG disintegrating tablet, Take 1 tablet (8 mg total) by mouth every 8 (eight) hours as needed for nausea or vomiting. (Patient not taking: Reported on 10/24/2023), Disp: 20 tablet, Rfl: 0  Allergies  Allergen Reactions   Codeine Nausea And Vomiting    I personally reviewed active problem list, medication list, allergies with the patient/caregiver today.   ROS  Ten systems reviewed and is negative except as mentioned in HPI    Objective Physical Exam CONSTITUTIONAL: Patient appears well-developed and well-nourished. No distress. HEENT: Head atraumatic, normocephalic, neck supple. CARDIOVASCULAR: Normal rate, regular rhythm and normal heart sounds. No murmur heard. No BLE edema. PULMONARY: Effort normal and breath sounds normal. Lungs clear to auscultation. No respiratory distress. MUSCULOSKELETAL: Normal  gait. Without gross motor or sensory deficit. PSYCHIATRIC: Patient has a normal mood and affect. Behavior is normal. Judgment and thought content normal.  Vitals:   10/24/23 1549  BP: 116/68  Pulse: 84  Resp: 16  SpO2: 94%  Weight: 143 lb 1.6 oz (64.9 kg)  Height: 5' (1.524 m)    Body mass index is 27.95 kg/m.  Recent Results (from the past 2160 hours)  TSH     Status: Abnormal   Collection Time: 07/27/23  1:50 PM  Result Value Ref Range   TSH 0.377 (L) 0.450 - 4.500 uIU/mL    Diabetic Foot Exam:     PHQ2/9:    10/24/2023    3:45 PM 03/20/2023    3:12 PM 06/28/2022   11:50 AM 06/05/2022    8:45 AM 11/11/2021    4:11 PM  Depression screen PHQ 2/9  Decreased Interest 0 0 0 0 0  Down, Depressed, Hopeless 0 0 0 0 0  PHQ - 2 Score 0 0 0 0 0  Altered sleeping  0 0  0  Tired, decreased energy  0 0  0  Change in appetite  0 0  0  Feeling bad  or failure about yourself   0 0  0  Trouble concentrating  0 0  0  Moving slowly or fidgety/restless  0 0  0  Suicidal thoughts  0 0  0  PHQ-9 Score  0 0  0  Difficult doing work/chores  Not difficult at all   Not difficult at all    phq 9 is negative  Fall Risk:    10/24/2023    3:45 PM 03/20/2023    3:12 PM 06/28/2022   11:50 AM 06/05/2022    8:45 AM 11/11/2021    4:01 PM  Fall Risk   Falls in the past year? 0 0 0 0 0  Number falls in past yr: 0 0  0   Injury with Fall? 0 0  0   Risk for fall due to : No Fall Risks No Fall Risks No Fall Risks  No Fall Risks  Follow up Falls evaluation completed Falls prevention discussed;Education provided;Falls evaluation completed Falls prevention discussed  Falls prevention discussed      Data saved with a previous flowsheet row definition      Assessment & Plan Chronic obstructive pulmonary disease (COPD) with centrilobular emphysema and cylindrical bronchiectasis COPD is well-controlled with Trelegy.  - Continue Trelegy as prescribed. - Follow up with pulmonologist Dr.  Parris  Adult  hypothyroidism TSH levels improved, indicating better control.  - Order TSH test to monitor thyroid function.  Atherosclerosis of aorta Atherosclerosis identified. Previous rosuvastatin  not tolerated. Discussed risks and benefits of statins. Agreed to try pravastatin . - Start pravastatin  10 mg daily, preferably in the evening.  Prediabetes A1c indicates prediabetes. Emphasized dietary modifications. - Continue dietary modifications to reduce carbohydrate intake.  Gastroesophageal reflux disease (GERD) with hiatal hernia GERD symptoms well-controlled with dietary modifications. Hiatal hernia asymptomatic. - Continue dietary modifications to manage GERD symptoms.  Gallstones without cholecystitis Gallstones present but asymptomatic. Managed with dietary adjustments. - Continue to manage symptoms with dietary adjustments.  Abnormal liver enzymes, fluctuating Liver enzymes fluctuating. Follow-up with GI specialist overdue. - Schedule follow-up appointment with GI specialist.  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 Discussed potential link to sleep apnea but ESS today was only 6 and insurance will likely not cover the study. Echocardiogram considered if deductible is met. - Consider echocardiogram to evaluate enlarged pulmonary artery if deductible is met.

## 2023-11-10 ENCOUNTER — Other Ambulatory Visit: Payer: Self-pay | Admitting: Family Medicine

## 2023-11-10 DIAGNOSIS — E785 Hyperlipidemia, unspecified: Secondary | ICD-10-CM

## 2023-11-10 DIAGNOSIS — I700 Atherosclerosis of aorta: Secondary | ICD-10-CM

## 2023-11-19 ENCOUNTER — Telehealth: Payer: Self-pay

## 2023-11-19 NOTE — Telephone Encounter (Signed)
 Pt aware lab form will be upfront and to make sure not to come in between 12pm-1pm and to be here before 5 pm.

## 2023-11-19 NOTE — Telephone Encounter (Signed)
 Copied from CRM #[id number]. Topic: General - Other >> Nov 19, 2023  9:57 AM Delon DASEN wrote: Reason for CRM: Patient needs to pick up lab order for lab- please call [phone]

## 2023-11-27 LAB — TSH: TSH: 0.422 u[IU]/mL — ABNORMAL LOW (ref 0.450–4.500)

## 2023-11-29 ENCOUNTER — Ambulatory Visit: Payer: Self-pay | Admitting: Family Medicine

## 2023-11-30 ENCOUNTER — Other Ambulatory Visit: Payer: Self-pay | Admitting: Family Medicine

## 2023-11-30 DIAGNOSIS — E039 Hypothyroidism, unspecified: Secondary | ICD-10-CM

## 2023-11-30 MED ORDER — LEVOTHYROXINE SODIUM 112 MCG PO TABS: 112.0000 ug | ORAL_TABLET | ORAL | 0 refills | Status: DC

## 2023-11-30 MED ORDER — LEVOTHYROXINE SODIUM 100 MCG PO TABS: 100.0000 ug | ORAL_TABLET | ORAL | 0 refills | Status: DC

## 2023-12-28 ENCOUNTER — Encounter: Admitting: Family Medicine

## 2024-02-20 ENCOUNTER — Other Ambulatory Visit: Payer: Self-pay | Admitting: Family Medicine

## 2024-02-20 DIAGNOSIS — E039 Hypothyroidism, unspecified: Secondary | ICD-10-CM

## 2024-02-21 ENCOUNTER — Other Ambulatory Visit: Payer: Self-pay | Admitting: Family Medicine

## 2024-02-21 DIAGNOSIS — E039 Hypothyroidism, unspecified: Secondary | ICD-10-CM

## 2024-02-21 NOTE — Telephone Encounter (Signed)
 Copied from CRM [id number]. Topic: Clinical - Medication Refill >> Feb 21, 2024  9:37 AM Kendralyn S wrote: Medication:  levothyroxine  (SYNTHROID ) 112 MCG tablet   Has the patient contacted their pharmacy? Yes (Agent: If no, request that the patient contact the pharmacy for the refill. If patient does not wish to contact the pharmacy document the reason why and proceed with request.) (Agent: If yes, when and what did the pharmacy advise?)  This is the patient's preferred pharmacy:   [id number] [place], [place] - [address] AT [facility] OF [address] [place] [address] Phone: [phone] Fax: [phone]  Is this the correct pharmacy for this prescription? Yes If no, delete pharmacy and type the correct one.   Has the prescription been filled recently? No  Is the patient out of the medication? Yes  Has the patient been seen for an appointment in the last year OR does the patient have an upcoming appointment? Yes  Can we respond through MyChart? No  Agent: Please be advised that Rx refills may take up to 3 business days. We ask that you follow-up with your pharmacy.

## 2024-02-22 NOTE — Telephone Encounter (Signed)
 Requested Prescriptions  Refused Prescriptions Disp Refills   levothyroxine  (SYNTHROID ) 112 MCG tablet 36 tablet 0    Sig: Take 1 tablet (112 mcg total) by mouth 3 (three) times a week.     Endocrinology:  Hypothyroid Agents Failed - 02/22/2024  5:34 PM      Failed - TSH in normal range and within 360 days    TSH  Date Value Ref Range Status  11/26/2023 0.422 (L) 0.450 - 4.500 uIU/mL Final         Passed - Valid encounter within last 12 months    Recent Outpatient Visits           4 months ago Centrilobular emphysema [facility])   [place] [facility] [place] Mire, MD       Future Appointments             In 2 months Glenard, Krichna, MD [facility], [place]

## 2024-02-22 NOTE — Telephone Encounter (Signed)
 Requested Prescriptions  Pending Prescriptions Disp Refills   levothyroxine  (SYNTHROID ) 100 MCG tablet [Pharmacy Med Name: LEVOTHYROXINE  0.100MG  ( ) TAB] 24 tablet 0    Sig: TAKE 1 TABLET(100 MCG) BY MOUTH 2 TIMES A WEEK     Endocrinology:  Hypothyroid Agents Failed - 02/22/2024  3:51 PM      Failed - TSH in normal range and within 360 days    TSH  Date Value Ref Range Status  11/26/2023 0.422 (L) 0.450 - 4.500 uIU/mL Final         Passed - Valid encounter within last 12 months    Recent Outpatient Visits           4 months ago Centrilobular emphysema [facility])   [place] [facility] Glenard Mire, MD       Future Appointments             In 2 months Glenard, Krichna, MD [facility], [place]             levothyroxine  (SYNTHROID ) 112 MCG tablet [Pharmacy Med Name: LEVOTHYROXINE  0.112MG  ( ) TABS] 36 tablet 0    Sig: TAKE 1 TABLET(112 MCG) BY MOUTH 3 TIMES A WEEK     Endocrinology:  Hypothyroid Agents Failed - 02/22/2024  3:51 PM      Failed - TSH in normal range and within 360 days    TSH  Date Value Ref Range Status  11/26/2023 0.422 (L) 0.450 - 4.500 uIU/mL Final         Passed - Valid encounter within last 12 months    Recent Outpatient Visits           4 months ago Centrilobular emphysema [facility])   [place] [facility] Glenard Mire, MD       Future Appointments             In 2 months Glenard, Krichna, MD [facility], [place]

## 2024-04-25 ENCOUNTER — Ambulatory Visit: Admitting: Family Medicine

## 2024-05-07 ENCOUNTER — Ambulatory Visit (INDEPENDENT_AMBULATORY_CARE_PROVIDER_SITE_OTHER): Admitting: Family Medicine

## 2024-05-07 ENCOUNTER — Encounter: Payer: Self-pay | Admitting: Family Medicine

## 2024-05-07 VITALS — BP 114/70 | HR 85 | Resp 16 | Ht 60.0 in | Wt 141.2 lb

## 2024-05-07 DIAGNOSIS — E039 Hypothyroidism, unspecified: Secondary | ICD-10-CM | POA: Diagnosis not present

## 2024-05-07 DIAGNOSIS — Z1231 Encounter for screening mammogram for malignant neoplasm of breast: Secondary | ICD-10-CM | POA: Diagnosis not present

## 2024-05-07 DIAGNOSIS — E559 Vitamin D deficiency, unspecified: Secondary | ICD-10-CM

## 2024-05-07 DIAGNOSIS — J479 Bronchiectasis, uncomplicated: Secondary | ICD-10-CM | POA: Diagnosis not present

## 2024-05-07 DIAGNOSIS — K219 Gastro-esophageal reflux disease without esophagitis: Secondary | ICD-10-CM | POA: Diagnosis not present

## 2024-05-07 DIAGNOSIS — R739 Hyperglycemia, unspecified: Secondary | ICD-10-CM | POA: Diagnosis not present

## 2024-05-07 DIAGNOSIS — K449 Diaphragmatic hernia without obstruction or gangrene: Secondary | ICD-10-CM

## 2024-05-07 DIAGNOSIS — Z79899 Other long term (current) drug therapy: Secondary | ICD-10-CM

## 2024-05-07 DIAGNOSIS — I288 Other diseases of pulmonary vessels: Secondary | ICD-10-CM

## 2024-05-07 DIAGNOSIS — J432 Centrilobular emphysema: Secondary | ICD-10-CM | POA: Diagnosis not present

## 2024-05-07 DIAGNOSIS — E785 Hyperlipidemia, unspecified: Secondary | ICD-10-CM

## 2024-05-07 DIAGNOSIS — R7303 Prediabetes: Secondary | ICD-10-CM

## 2024-05-07 NOTE — Progress Notes (Signed)
 Name: Maria Richardson   MRN: [id number]    DOB: [birth year]   Date:05/07/2024       Progress Note  Subjective  Chief Complaint  Chief Complaint  Patient presents with   Medical Management of Chronic Issues   Discussed the use of AI scribe software for clinical note transcription with the patient, who gave verbal consent to proceed.  History of Present Illness Maria Richardson is a 64 year old female with asthma, COPD, and reflux who presents for follow-up and medication management.  She has a history of asthma and COPD, currently managed with Trelegy. A CT scan from January of last year showed lung nodules, emphysema, and cylindrical bronchiectasis. She has a follow-up appointment with her pulmonologist scheduled for tomorrow.  She has reflux with a hiatal hernia, which exacerbates her cough when she eats improperly. Pantoprazole has been prescribed in the past, which usually alleviates her symptoms. If she does not improve her diet, the reflux symptoms persist.  She has adult hypothyroidism and is currently alternating between 100 mcg and 112 mcg of her thyroid medication, taking 112 mcg three times a week and 100 mcg twice a week, skipping the weekend. She has been following this regimen for a long time, she denies change in bowel movements, increase in dry skin.  She has dyslipidemia and has not been taking pravastatin , as she is trying to manage her cholesterol through diet. She wants to check her cholesterol levels, as she has been eating healthier recently.  Her liver enzymes have been elevated in the past, with an ALT of 175 and AST of 127 as of April last year. Despite extensive testing, including a GI specialist consultation, the cause of the elevation remains undetermined. An ultrasound in April 2025 showed gallstones but no fatty liver.  Her brother was recently diagnosed with cancer, with a lump on his neck. A biopsy confirmed the diagnosis, but the primary type of cancer is still  unknown. She is actively involved in his care and is awaiting further information.  No shortness of breath, nausea, belly pain, or swelling. She reports a stable weight of 141 pounds, consistent with her weight in December 2024.    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   History of anemia 01/07/2015   Excess weight 01/07/2015   Pre-diabetes 01/07/2015   Leiomyoma of uterus 01/07/2015   Calculus of gallbladder 01/07/2015   Adult hypothyroidism 09/24/2009   COPD with asthma (HCC) 05/18/2009   Vitamin D  deficiency 05/18/2009    Past Surgical History:  Procedure Laterality Date   APPENDECTOMY     BREAST BIOPSY Left 01/10/2022   US  Core Bx 5:00 Ribbon clip path pending   COLONOSCOPY N/A 03/11/2021   Procedure: COLONOSCOPY;  Surgeon: Jinny Carmine, MD;  Location: [facility];  Service: Endoscopy;  Laterality: N/A;   COLONOSCOPY WITH PROPOFOL  N/A 07/23/2015   Procedure: COLONOSCOPY WITH PROPOFOL ;  Surgeon: Carmine Jinny, MD;  Location: [facility];  Service:  Endoscopy;  Laterality: N/A;  PLEASE LEAVE PT AT LATER AM    ESOPHAGOGASTRODUODENOSCOPY (EGD) WITH PROPOFOL  N/A 11/06/2022   Procedure: ESOPHAGOGASTRODUODENOSCOPY (EGD) WITH PROPOFOL ;  Surgeon: Jinny Carmine, MD;  Location: [facility];  Service: Endoscopy;  Laterality: N/A;   OOPHORECTOMY     as teenager   POLYPECTOMY  07/23/2015   Procedure: POLYPECTOMY;  Surgeon: Carmine Jinny, MD;  Location:  [facility];  Service: Endoscopy;;   POLYPECTOMY  03/11/2021   Procedure: POLYPECTOMY;  Surgeon: Jinny Carmine, MD;  Location: [facility];  Service: Endoscopy;;    Family History  Problem Relation Age of Onset   Diabetes Father    Hypertension Father    Kidney disease Father    Colon cancer Father     Social History   Tobacco Use   Smoking status: Former    Current packs/day: 0.00    Average packs/day: 0.5 packs/day for 30.0 years (15.0 ttl pk-yrs)    Types: Cigarettes    Start date: 04/27/1968    Quit date: 04/27/1998    Years since quitting: 26.0   Smokeless tobacco: Never  Substance Use Topics   Alcohol use: No    Alcohol/week: 0.0 standard drinks of alcohol    Current Medications[1]  Allergies[2]  I personally reviewed active problem list, medication list, allergies with the patient/caregiver today.   ROS  Ten systems reviewed and is negative except as mentioned in HPI    Objective Physical Exam MEASUREMENTS: Weight- 141. CONSTITUTIONAL: Patient appears well-developed and well-nourished.  No distress. HEENT: Head atraumatic, normocephalic, neck supple. CARDIOVASCULAR: Normal rate, regular rhythm and normal heart sounds.  No murmur heard. No BLE edema. PULMONARY: Effort normal and breath sounds normal. No respiratory distress. ABDOMINAL: There is no tenderness or distention. MUSCULOSKELETAL: Normal gait. Without gross motor or sensory deficit. PSYCHIATRIC: Patient has a normal mood and affect. behavior is normal. Judgment and thought content normal.  Vitals:   05/07/24 1427  BP: 114/70  Pulse: 85  Resp: 16  SpO2: 95%  Weight: 141 lb 3.2 oz (64 kg)  Height: 5' (1.524 m)    Body mass index is 27.58 kg/m.    PHQ2/9:    05/07/2024    2:23 PM 10/24/2023    3:45 PM 03/20/2023    3:12 PM 06/28/2022   11:50 AM 06/05/2022    8:45 AM  Depression screen PHQ 2/9  Decreased Interest 0 0 0 0 0  Down, Depressed, Hopeless 0 0 0 0 0  PHQ - 2  Score 0 0 0 0 0  Altered sleeping   0 0   Tired, decreased energy   0 0   Change in appetite   0 0   Feeling bad or failure about yourself    0 0   Trouble concentrating   0 0   Moving slowly or fidgety/restless   0 0   Suicidal thoughts   0 0   PHQ-9 Score   0  0    Difficult doing work/chores   Not difficult at all       Data saved with a previous flowsheet row definition    phq 9 is negative  Fall Risk:    05/07/2024    2:23 PM 10/24/2023    3:45 PM 03/20/2023    3:12 PM 06/28/2022   11:50 AM 06/05/2022    8:45 AM  Fall Risk   Falls in the past year? 0 0 0 0 0  Number falls in past yr: 0  0 0  0  Injury with Fall? 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 Managed with Trelegy Ellipta . Cough attributed to GERD. - Continue Trelegy Ellipta  100-62.5-25 MCG/INH oral daily. - Follow up with pulmonologist as scheduled.  Gastroesophageal reflux disease with hiatal hernia GERD with hiatal hernia causing cough. Previous pantoprazole effective. Cough due to reflux, not asthma. - Discuss reflux management with pulmonologist. - Consider dietary modifications to manage reflux symptoms.  Adult hypothyroidism Managed with levothyroxine . TSH levels need assessment. - Ordered TSH level. - Continue current levothyroxine  regimen until TSH results are available.  Dyslipidemia and atherosclerosis of aorta Dyslipidemia with aortic atherosclerosis. Importance of monitoring cholesterol and blood pressure emphasized. - Ordered lipid panel. - Continue pravastatin  as previously  prescribed.  Abnormal liver enzymes Fluctuating liver enzymes with no definitive cause identified. Previous extensive workup negative. - Ordered liver function tests. - Follow up with GI specialist if liver enzymes remain abnormal.  Gallstones without cholecystitis Gallstones identified with no symptoms of cholecystitis.  Enlarged pulmonary artery Noted on previous CT scan. No acute symptoms reported. - Continue to monitor for symptoms related to pulmonary artery enlargement.  Prediabetes Status with emphasis on dietary management. A1c levels need monitoring. - Ordered A1c test. - Encouraged dietary modifications to manage blood sugar levels.  Vitamin D  deficiency Vitamin D  levels need assessment. - Ordered vitamin D  level.  General Health Maintenance Routine health maintenance discussed. - Ordered mammogram.        [1]  Current Outpatient Medications:    Fluticasone -Umeclidin-Vilant (TRELEGY ELLIPTA ) 100-62.5-25 MCG/INH AEPB, Take 1 puff by mouth daily., Disp: 180 each, Rfl: 1   levothyroxine  (SYNTHROID ) 100 MCG tablet, TAKE 1 TABLET(100 MCG) BY MOUTH 2 TIMES A WEEK, Disp: 24 tablet, Rfl: 0   levothyroxine  (SYNTHROID ) 112 MCG tablet, TAKE 1 TABLET(112 MCG) BY MOUTH 3 TIMES A WEEK, Disp: 36 tablet, Rfl: 0   pravastatin  (PRAVACHOL ) 10 MG tablet, Take 1 tablet (10 mg total) by mouth daily., Disp: 90 tablet, Rfl: 1 [2]  Allergies Allergen Reactions   Codeine Nausea And Vomiting

## 2024-05-09 ENCOUNTER — Other Ambulatory Visit: Payer: Self-pay | Admitting: Family Medicine

## 2024-05-09 ENCOUNTER — Ambulatory Visit: Payer: Self-pay | Admitting: Family Medicine

## 2024-05-09 DIAGNOSIS — E039 Hypothyroidism, unspecified: Secondary | ICD-10-CM

## 2024-05-09 DIAGNOSIS — R748 Abnormal levels of other serum enzymes: Secondary | ICD-10-CM

## 2024-05-09 LAB — CBC WITH DIFFERENTIAL/PLATELET
Basophils Absolute: 0.1 10*3/uL (ref 0.0–0.2)
Basos: 1 %
EOS (ABSOLUTE): 0.2 10*3/uL (ref 0.0–0.4)
Eos: 3 %
Hematocrit: 45 % (ref 34.0–46.6)
Hemoglobin: 14.3 g/dL (ref 11.1–15.9)
Immature Grans (Abs): 0 10*3/uL (ref 0.0–0.1)
Immature Granulocytes: 0 %
Lymphocytes Absolute: 1.7 10*3/uL (ref 0.7–3.1)
Lymphs: 24 %
MCH: 28.9 pg (ref 26.6–33.0)
MCHC: 31.8 g/dL (ref 31.5–35.7)
MCV: 91 fL (ref 79–97)
Monocytes Absolute: 0.6 10*3/uL (ref 0.1–0.9)
Monocytes: 8 %
Neutrophils Absolute: 4.6 10*3/uL (ref 1.4–7.0)
Neutrophils: 64 %
Platelets: 258 10*3/uL (ref 150–450)
RBC: 4.95 x10E6/uL (ref 3.77–5.28)
RDW: 13.2 % (ref 11.7–15.4)
WBC: 7.2 10*3/uL (ref 3.4–10.8)

## 2024-05-09 LAB — LIPID PANEL
Chol/HDL Ratio: 3.8 ratio (ref 0.0–4.4)
Cholesterol, Total: 206 mg/dL — ABNORMAL HIGH (ref 100–199)
HDL: 54 mg/dL
LDL Chol Calc (NIH): 144 mg/dL — ABNORMAL HIGH (ref 0–99)
Triglycerides: 46 mg/dL (ref 0–149)
VLDL Cholesterol Cal: 8 mg/dL (ref 5–40)

## 2024-05-09 LAB — COMPREHENSIVE METABOLIC PANEL WITH GFR
ALT: 85 [IU]/L — ABNORMAL HIGH (ref 0–32)
AST: 107 [IU]/L — ABNORMAL HIGH (ref 0–40)
Albumin: 4.2 g/dL (ref 3.9–4.9)
Alkaline Phosphatase: 142 [IU]/L — ABNORMAL HIGH (ref 49–135)
BUN/Creatinine Ratio: 19 (ref 12–28)
BUN: 15 mg/dL (ref 8–27)
Bilirubin Total: 0.3 mg/dL (ref 0.0–1.2)
CO2: 22 mmol/L (ref 20–29)
Calcium: 10 mg/dL (ref 8.7–10.3)
Chloride: 105 mmol/L (ref 96–106)
Creatinine, Ser: 0.78 mg/dL (ref 0.57–1.00)
Globulin, Total: 3.1 g/dL (ref 1.5–4.5)
Glucose: 94 mg/dL (ref 70–99)
Potassium: 4 mmol/L (ref 3.5–5.2)
Sodium: 141 mmol/L (ref 134–144)
Total Protein: 7.3 g/dL (ref 6.0–8.5)
eGFR: 86 mL/min/{1.73_m2}

## 2024-05-09 LAB — VITAMIN D 25 HYDROXY (VIT D DEFICIENCY, FRACTURES): Vit D, 25-Hydroxy: 16 ng/mL — ABNORMAL LOW (ref 30.0–100.0)

## 2024-05-09 LAB — HEMOGLOBIN A1C
Est. average glucose Bld gHb Est-mCnc: 128 mg/dL
Hgb A1c MFr Bld: 6.1 % — ABNORMAL HIGH (ref 4.8–5.6)

## 2024-05-09 LAB — TSH: TSH: 0.544 u[IU]/mL (ref 0.450–4.500)

## 2024-05-09 MED ORDER — LEVOTHYROXINE SODIUM 100 MCG PO TABS: 100.0000 ug | ORAL_TABLET | ORAL | 1 refills | Status: AC

## 2024-05-09 MED ORDER — LEVOTHYROXINE SODIUM 112 MCG PO TABS: 112.0000 ug | ORAL_TABLET | ORAL | 1 refills | Status: AC
